# Patient Record
Sex: Male | Born: 1935 | Race: White | Hispanic: No | Marital: Married | Smoking: Never smoker
Health system: Southern US, Community
[De-identification: ages and names within clinical notes are randomized; demographics above are authoritative.]

## PROBLEM LIST (undated history)

## (undated) DIAGNOSIS — M25561 Pain in right knee: Secondary | ICD-10-CM

## (undated) DIAGNOSIS — R197 Diarrhea, unspecified: Secondary | ICD-10-CM

## (undated) DIAGNOSIS — M25562 Pain in left knee: Secondary | ICD-10-CM

## (undated) DIAGNOSIS — E119 Type 2 diabetes mellitus without complications: Secondary | ICD-10-CM

## (undated) DIAGNOSIS — M1990 Unspecified osteoarthritis, unspecified site: Secondary | ICD-10-CM

## (undated) DIAGNOSIS — I739 Peripheral vascular disease, unspecified: Secondary | ICD-10-CM

## (undated) DIAGNOSIS — I10 Essential (primary) hypertension: Secondary | ICD-10-CM

## (undated) DIAGNOSIS — C61 Malignant neoplasm of prostate: Secondary | ICD-10-CM

## (undated) DIAGNOSIS — C20 Malignant neoplasm of rectum: Secondary | ICD-10-CM

## (undated) DIAGNOSIS — E1340 Other specified diabetes mellitus with diabetic neuropathy, unspecified: Secondary | ICD-10-CM

## (undated) DIAGNOSIS — K589 Irritable bowel syndrome without diarrhea: Secondary | ICD-10-CM

## (undated) DIAGNOSIS — Z86711 Personal history of pulmonary embolism: Secondary | ICD-10-CM

## (undated) DIAGNOSIS — E785 Hyperlipidemia, unspecified: Secondary | ICD-10-CM

## (undated) DIAGNOSIS — N189 Chronic kidney disease, unspecified: Secondary | ICD-10-CM

## (undated) DIAGNOSIS — N400 Enlarged prostate without lower urinary tract symptoms: Secondary | ICD-10-CM

## (undated) HISTORY — PX: EYE SURGERY: SHX253

## (undated) HISTORY — PX: JOINT REPLACEMENT: SHX530

## (undated) HISTORY — PX: PROSTATECTOMY: SHX69

## (undated) HISTORY — PX: TONSILLECTOMY: SUR1361

## (undated) HISTORY — DX: Malignant neoplasm of rectum: C20

## (undated) SURGERY — Surgical Case
Anesthesia: *Unknown

## (undated) SURGERY — PROCTECTOMY, ABDOMINOPERINEAL
Anesthesia: General

---

## 2007-02-20 ENCOUNTER — Ambulatory Visit: Payer: Self-pay | Admitting: Internal Medicine

## 2007-09-25 ENCOUNTER — Ambulatory Visit: Payer: Self-pay | Admitting: Internal Medicine

## 2007-10-03 ENCOUNTER — Emergency Department: Payer: Self-pay | Admitting: Emergency Medicine

## 2007-10-03 ENCOUNTER — Other Ambulatory Visit: Payer: Self-pay

## 2007-10-09 ENCOUNTER — Ambulatory Visit: Payer: Self-pay | Admitting: Internal Medicine

## 2007-10-18 ENCOUNTER — Ambulatory Visit: Payer: Self-pay | Admitting: Otolaryngology

## 2008-05-08 ENCOUNTER — Ambulatory Visit: Payer: Self-pay | Admitting: Internal Medicine

## 2008-05-15 ENCOUNTER — Ambulatory Visit: Payer: Self-pay | Admitting: Internal Medicine

## 2008-09-09 ENCOUNTER — Ambulatory Visit: Payer: Self-pay | Admitting: Gastroenterology

## 2008-12-10 ENCOUNTER — Emergency Department: Payer: Self-pay | Admitting: Emergency Medicine

## 2008-12-16 ENCOUNTER — Ambulatory Visit: Payer: Self-pay | Admitting: Internal Medicine

## 2009-04-01 ENCOUNTER — Ambulatory Visit: Payer: Self-pay | Admitting: Internal Medicine

## 2009-04-29 ENCOUNTER — Ambulatory Visit: Payer: Self-pay | Admitting: Internal Medicine

## 2010-02-19 ENCOUNTER — Ambulatory Visit: Payer: Self-pay | Admitting: Internal Medicine

## 2010-02-20 ENCOUNTER — Ambulatory Visit: Payer: Self-pay | Admitting: Internal Medicine

## 2010-05-31 HISTORY — PX: KNEE ARTHROSCOPY W/ MENISCAL REPAIR: SHX1877

## 2010-07-15 ENCOUNTER — Encounter (HOSPITAL_COMMUNITY)
Admission: RE | Admit: 2010-07-15 | Discharge: 2010-07-15 | Disposition: A | Discharge status: Home / Self Care | Payer: Medicare Other | Source: Ambulatory Visit | Attending: Orthopedic Surgery | Admitting: Orthopedic Surgery

## 2010-07-15 ENCOUNTER — Other Ambulatory Visit (HOSPITAL_COMMUNITY): Payer: Self-pay | Admitting: Orthopedic Surgery

## 2010-07-15 DIAGNOSIS — T84093A Other mechanical complication of internal left knee prosthesis, initial encounter: Secondary | ICD-10-CM

## 2010-07-15 DIAGNOSIS — Z01818 Encounter for other preprocedural examination: Secondary | ICD-10-CM | POA: Insufficient documentation

## 2010-07-15 DIAGNOSIS — Z01810 Encounter for preprocedural cardiovascular examination: Secondary | ICD-10-CM | POA: Insufficient documentation

## 2010-07-15 DIAGNOSIS — Z01812 Encounter for preprocedural laboratory examination: Secondary | ICD-10-CM | POA: Insufficient documentation

## 2010-07-15 LAB — CBC
HCT: 39.2 % (ref 39.0–52.0)
MCHC: 34.2 g/dL (ref 30.0–36.0)
Platelets: 187 10*3/uL (ref 150–400)
RDW: 12.6 % (ref 11.5–15.5)
WBC: 6.1 10*3/uL (ref 4.0–10.5)

## 2010-07-15 LAB — BASIC METABOLIC PANEL
BUN: 18 mg/dL (ref 6–23)
Calcium: 9.5 mg/dL (ref 8.4–10.5)
Creatinine, Ser: 0.99 mg/dL (ref 0.4–1.5)
GFR calc Af Amer: >60 mL/min (ref >60)

## 2010-07-15 LAB — PROTIME-INR
INR: 0.93 (ref 0.00–1.49)
Prothrombin Time: 12.7 seconds (ref 11.6–15.2)

## 2010-07-15 LAB — TYPE AND SCREEN
ABO/RH(D): A POS
Antibody Screen: NEGATIVE

## 2010-07-15 LAB — SURGICAL PCR SCREEN
MRSA, PCR: NEGATIVE
Staphylococcus aureus: POSITIVE — AB

## 2010-07-15 LAB — SEDIMENTATION RATE: Sed Rate: 5 mm/hr (ref 0–16)

## 2010-07-21 ENCOUNTER — Inpatient Hospital Stay (HOSPITAL_COMMUNITY)
Admission: RE | Admit: 2010-07-21 | Discharge: 2010-07-24 | DRG: 489 | Disposition: A | Discharge status: Skilled Nursing Facility | Payer: Medicare Other | Source: Ambulatory Visit | Attending: Orthopedic Surgery | Admitting: Orthopedic Surgery

## 2010-07-21 DIAGNOSIS — Z7982 Long term (current) use of aspirin: Secondary | ICD-10-CM

## 2010-07-21 DIAGNOSIS — G609 Hereditary and idiopathic neuropathy, unspecified: Secondary | ICD-10-CM | POA: Diagnosis present

## 2010-07-21 DIAGNOSIS — I10 Essential (primary) hypertension: Secondary | ICD-10-CM | POA: Diagnosis present

## 2010-07-21 DIAGNOSIS — T8489XA Other specified complication of internal orthopedic prosthetic devices, implants and grafts, initial encounter: Principal | ICD-10-CM | POA: Diagnosis present

## 2010-07-21 DIAGNOSIS — E119 Type 2 diabetes mellitus without complications: Secondary | ICD-10-CM | POA: Diagnosis present

## 2010-07-21 DIAGNOSIS — Z23 Encounter for immunization: Secondary | ICD-10-CM

## 2010-07-21 DIAGNOSIS — Y831 Surgical operation with implant of artificial internal device as the cause of abnormal reaction of the patient, or of later complication, without mention of misadventure at the time of the procedure: Secondary | ICD-10-CM | POA: Diagnosis present

## 2010-07-21 DIAGNOSIS — Z96659 Presence of unspecified artificial knee joint: Secondary | ICD-10-CM

## 2010-07-21 DIAGNOSIS — Z8546 Personal history of malignant neoplasm of prostate: Secondary | ICD-10-CM

## 2010-07-21 LAB — GLUCOSE, CAPILLARY: Glucose-Capillary: 181 mg/dL — ABNORMAL HIGH (ref 70–99)

## 2010-07-22 LAB — BASIC METABOLIC PANEL
BUN: 13 mg/dL (ref 6–23)
Calcium: 8.6 mg/dL (ref 8.4–10.5)
GFR calc non Af Amer: >60 mL/min (ref >60)
Glucose, Bld: 218 mg/dL — ABNORMAL HIGH (ref 70–99)

## 2010-07-22 LAB — CBC
MCH: 29.5 pg (ref 26.0–34.0)
MCHC: 33.1 g/dL (ref 30.0–36.0)
Platelets: 166 10*3/uL (ref 150–400)
RDW: 12.7 % (ref 11.5–15.5)

## 2010-07-22 LAB — GLUCOSE, CAPILLARY
Glucose-Capillary: 214 mg/dL — ABNORMAL HIGH (ref 70–99)
Glucose-Capillary: 154 mg/dL — ABNORMAL HIGH (ref 70–99)

## 2010-07-22 LAB — PROTIME-INR: Prothrombin Time: 13.8 seconds (ref 11.6–15.2)

## 2010-07-23 ENCOUNTER — Encounter: Payer: Self-pay | Admitting: Internal Medicine

## 2010-07-23 LAB — WOUND CULTURE

## 2010-07-23 LAB — GLUCOSE, CAPILLARY
Glucose-Capillary: 165 mg/dL — ABNORMAL HIGH (ref 70–99)
Glucose-Capillary: 173 mg/dL — ABNORMAL HIGH (ref 70–99)

## 2010-07-23 LAB — CBC
HCT: 30.5 % — ABNORMAL LOW (ref 39.0–52.0)
MCHC: 33.4 g/dL (ref 30.0–36.0)
RDW: 12.9 % (ref 11.5–15.5)

## 2010-07-23 LAB — BASIC METABOLIC PANEL
BUN: 12 mg/dL (ref 6–23)
CO2: 24 mEq/L (ref 19–32)
Chloride: 106 mEq/L (ref 96–112)
Creatinine, Ser: 1.03 mg/dL (ref 0.4–1.5)
Glucose, Bld: 159 mg/dL — ABNORMAL HIGH (ref 70–99)

## 2010-07-23 LAB — PROTIME-INR: INR: 1.37 (ref 0.00–1.49)

## 2010-07-24 LAB — BASIC METABOLIC PANEL
CO2: 25 mEq/L (ref 19–32)
Calcium: 8.3 mg/dL — ABNORMAL LOW (ref 8.4–10.5)
Creatinine, Ser: 0.93 mg/dL (ref 0.4–1.5)
GFR calc Af Amer: >60 mL/min (ref >60)
Sodium: 138 mEq/L (ref 135–145)

## 2010-07-24 LAB — GLUCOSE, CAPILLARY
Glucose-Capillary: 166 mg/dL — ABNORMAL HIGH (ref 70–99)
Glucose-Capillary: 145 mg/dL — ABNORMAL HIGH (ref 70–99)
Glucose-Capillary: 125 mg/dL — ABNORMAL HIGH (ref 70–99)

## 2010-07-24 LAB — CBC
HCT: 28.3 % — ABNORMAL LOW (ref 39.0–52.0)
MCV: 87.1 fL (ref 78.0–100.0)
Platelets: 134 10*3/uL — ABNORMAL LOW (ref 150–400)
RBC: 3.25 MIL/uL — ABNORMAL LOW (ref 4.22–5.81)
RDW: 12.7 % (ref 11.5–15.5)
WBC: 8.3 10*3/uL (ref 4.0–10.5)

## 2010-07-24 LAB — PROTIME-INR: INR: 2.12 — ABNORMAL HIGH (ref 0.00–1.49)

## 2010-07-24 NOTE — Discharge Summary (Signed)
  NAMEEMILE, KYLLO                  ACCOUNT NO.:  [id number]  MEDICAL RECORD NO.:  [id number]           PATIENT TYPE:  I  LOCATION:  5011                         FACILITY:  [facility]  PHYSICIAN:  Eulas Post, MD    DATE OF BIRTH:  [birth year]  DATE OF ADMISSION:  07/21/2010 DATE OF DISCHARGE:  07/24/2010                        DISCHARGE SUMMARY - REFERRING   ADMISSION DIAGNOSIS:  Left knee painful arthroplasty.  DISCHARGE DIAGNOSIS:  Left knee painful arthroplasty.  PRIMARY PROCEDURES:  Left knee revision arthroplasty with polyethylene exchange.  DISCHARGE MEDICATIONS:  Lovenox bridging to Coumadin until therapeutic for a period of 1 month after surgery.  He will also be on Percocet for pain and can resume his home medications.  DISCHARGE INSTRUCTIONS:  Danon Lograsso can be weightbearing as tolerated and working on range of motion of his knee.  He is working with physical therapy and will have dressing changes every other day as needed.  HOSPITAL COURSE:  Mr. Rashid Whitenight is a 77 year old gentleman who complained of severe pain in his left knee status post total knee replacement.  He elected for revision surgery.  None of the components were found to be loose at the time of surgery, and there was fairly minimal wear on polyethylene, but there was a fairly hypertrophic cyclops type lesion surrounding the patella that was excised.  Cultures were also taken at time of surgery and sed rate, CRP, and cultures were all negative.  He tolerated the procedure well and postoperatively denied having any complications.  He was given perioperative antibiotics for antimicrobial prophylaxis.  He was given sequential compressive devices and Coumadin and Lovenox for DVT prophylaxis.  He was ambulating with physical therapy making slow and steady progress.  He is planned be discharged to skilled nursing facility with followup with me in 2 weeks. There were no complications.  He benefited maximally  from this hospital stay.     Eulas Post, MD     JPL/MEDQ  D:  07/24/2010  T:  07/24/2010  Job:  [id number]  Electronically Signed by Teryl Lucy MD on 07/24/2010 11:20:37 AM

## 2010-07-24 NOTE — Op Note (Signed)
Darryl Hooper, Darryl Hooper                  ACCOUNT NO.:  [id number]  MEDICAL RECORD NO.:  [id number]           PATIENT TYPE:  I  LOCATION:  5011                         FACILITY:  [facility]  PHYSICIAN:  Eulas Post, MD    DATE OF BIRTH:  11/25/1929  DATE OF PROCEDURE:  07/21/2010 DATE OF DISCHARGE:                              OPERATIVE REPORT   PREOPERATIVE DIAGNOSIS:  Failed left total knee arthroplasty.  POSTOPERATIVE DIAGNOSIS:  Failed left total knee arthroplasty.  OPERATIVE PROCEDURE:  Revision of left total knee arthroplasty with extensive synovectomy and polyethylene exchange.  ANESTHESIA:  General.  ESTIMATED BLOOD LOSS:  Minimal.  TOURNIQUET TIME:  Approximately 80 minutes.  ATTENDING SURGEON:  Eulas Post, MD  FIRST ASSISTANT:  Skip Mayer, PA-C  PREOPERATIVE INDICATIONS:  [name] is a 77 year old gentleman who had had left total knee arthroplasty in 2004 who complained of ongoing severe pain as well as recurrent swelling.  He had preoperative labs that were normal including sed rate and CRP, and infection workup had been negative.  He had x-rays that have a suggestion of some narrowing of the polyethylene liner, as well as questionable integrity of the tibial component with a very small cement mantle.  He failed extensive conservative measures and complained of pain for years.  He finally sought my second opinion, and we discussed the risks, benefits, alternatives to surgical versus nonsurgical intervention including but not limited to risks of infection, bleeding, nerve injury, periprosthetic fracture, blood clots, blood transfusion, incomplete relief of pain, instability of the knee, cardiopulmonary complications, among others, and he is willing to proceed.  OPERATIVE PROCEDURE:  The patient was brought to the operating room, placed in supine position.  IV antibiotics were given.  General anesthesia administered.  Left lower extremity was prepped and  draped in usual sterile fashion.  Incision was made through his previous incision. Medial parapatellar approach was performed.  The joint fluid was normal. There was fairly extensive synovial scar formation diffusely around the knee.  I excised this completely.  There was a large discoid circumferential soft tissue hypertrophy around the patella, such that the patellar button itself was not exposed at all.  I excised this meniscus completely.  I inspected the patella closely, and there were no signs of loosening.  The button was well fixed.  I then performed a slight exposure of the medial side, and debrided all of the synovium as well as the soft tissues that appeared to be potentially irritating.  There was some small areas of yellowish tan tissue around the cement interface on the medial side of the femur that was debrided.  The synovium itself appeared to be somewhat hypertrophic and irritated.  I debrided the tissue from the notch, as well as from the lateral gutter, as well as posterior to the patellar tendon.  Once all of the debridement and excision of the synovium had been completed, I then everted the patella and brought the knee into flexion and removed the tibial polyethylene insert.  This insert was in relatively good condition, although there was a small area of pinning  on the lateral condyle, as well as a little bit of wear on the polyethylene post.  The component was a size 9 x 12.  I removed the polyethylene and then inspected the trays, and they were all intact.  I was fairly aggressive at trying to destabilize the tibia as well as the femur, however, the components were very well fixed.  I did not appreciate any evidence for loosening in either component.  I inspected both medial lateral as well as at the posterior on both the femur and the tibia.  Once I had assured myself that the components were indeed solid, I checked his flexion and extension gaps, and they were  fairly symmetric.  With a 12-mm polyethylene trial, his knee reached full extension and dropped to 115 degrees of flexion and was stable in all ranges.  The patella was somewhat bound laterally, and I performed a subcutaneous release of adhesions, which allowed the patella to track centrally.  No thumb technique was needed.  I then trialed with the size 15 polyethylene liner; however, this is tight and would not reach full extension, and I felt that I was overstuffing the joint to go up to a 15.  For this reason, I selected the size 12 polyethylene liner and I inserted it and it fit in appropriately.  Prior to placing the liner, I also irrigated the knee copiously with a total of 6 liters of normal saline.  I also sent deep cultures of the knee.  There were no clinical signs of infection.  Once irrigation had been completed, I placed the real polyethylene liner and the knee again had full motion with normal stability.  I then repaired the fascia.  The parapatellar tissue with #1 Vicryl followed by 0 and 2-0 Vicryl for the subcutaneous tissue and Monocryl for the skin. Steri-Strips and sterile gauze were applied.  We then released the tourniquet, and the patient was awakened and returned to PACU in stable and satisfactory condition.  Skip Mayer was present and scrubbed throughout the case and critical for completion assisting with exposure, retraction, positioning, and closure.     Eulas Post, MD     JPL/MEDQ  D:  07/21/2010  T:  07/22/2010  Job:  [id number]  Electronically Signed by Teryl Lucy MD on 07/24/2010 11:20:35 AM

## 2010-07-27 LAB — ANAEROBIC CULTURE

## 2010-07-30 ENCOUNTER — Encounter: Payer: Self-pay | Admitting: Internal Medicine

## 2010-09-08 ENCOUNTER — Ambulatory Visit: Payer: Self-pay

## 2010-11-25 ENCOUNTER — Inpatient Hospital Stay: Payer: Self-pay | Admitting: Internal Medicine

## 2010-11-25 DIAGNOSIS — I059 Rheumatic mitral valve disease, unspecified: Secondary | ICD-10-CM

## 2012-05-06 ENCOUNTER — Ambulatory Visit: Payer: Self-pay | Admitting: Internal Medicine

## 2012-12-27 DIAGNOSIS — C61 Malignant neoplasm of prostate: Secondary | ICD-10-CM | POA: Insufficient documentation

## 2014-07-01 DIAGNOSIS — I10 Essential (primary) hypertension: Secondary | ICD-10-CM | POA: Diagnosis not present

## 2014-07-01 DIAGNOSIS — R197 Diarrhea, unspecified: Secondary | ICD-10-CM | POA: Diagnosis not present

## 2014-07-01 DIAGNOSIS — L309 Dermatitis, unspecified: Secondary | ICD-10-CM | POA: Diagnosis not present

## 2014-07-01 DIAGNOSIS — M1990 Unspecified osteoarthritis, unspecified site: Secondary | ICD-10-CM | POA: Diagnosis not present

## 2014-07-01 DIAGNOSIS — E119 Type 2 diabetes mellitus without complications: Secondary | ICD-10-CM | POA: Diagnosis not present

## 2014-07-17 DIAGNOSIS — E119 Type 2 diabetes mellitus without complications: Secondary | ICD-10-CM | POA: Diagnosis not present

## 2014-07-17 DIAGNOSIS — L309 Dermatitis, unspecified: Secondary | ICD-10-CM | POA: Diagnosis not present

## 2014-07-17 DIAGNOSIS — I10 Essential (primary) hypertension: Secondary | ICD-10-CM | POA: Diagnosis not present

## 2014-07-17 DIAGNOSIS — I739 Peripheral vascular disease, unspecified: Secondary | ICD-10-CM | POA: Diagnosis not present

## 2014-07-17 DIAGNOSIS — E1140 Type 2 diabetes mellitus with diabetic neuropathy, unspecified: Secondary | ICD-10-CM | POA: Diagnosis not present

## 2014-08-02 DIAGNOSIS — E119 Type 2 diabetes mellitus without complications: Secondary | ICD-10-CM | POA: Diagnosis not present

## 2014-08-02 DIAGNOSIS — M7989 Other specified soft tissue disorders: Secondary | ICD-10-CM | POA: Diagnosis not present

## 2014-08-02 DIAGNOSIS — M79609 Pain in unspecified limb: Secondary | ICD-10-CM | POA: Diagnosis not present

## 2014-08-02 DIAGNOSIS — I87099 Postthrombotic syndrome with other complications of unspecified lower extremity: Secondary | ICD-10-CM | POA: Diagnosis not present

## 2014-08-23 DIAGNOSIS — R197 Diarrhea, unspecified: Secondary | ICD-10-CM | POA: Diagnosis not present

## 2014-08-23 DIAGNOSIS — M1990 Unspecified osteoarthritis, unspecified site: Secondary | ICD-10-CM | POA: Diagnosis not present

## 2014-08-23 DIAGNOSIS — I10 Essential (primary) hypertension: Secondary | ICD-10-CM | POA: Diagnosis not present

## 2014-08-23 DIAGNOSIS — E1140 Type 2 diabetes mellitus with diabetic neuropathy, unspecified: Secondary | ICD-10-CM | POA: Diagnosis not present

## 2014-08-23 DIAGNOSIS — K219 Gastro-esophageal reflux disease without esophagitis: Secondary | ICD-10-CM | POA: Diagnosis not present

## 2014-09-06 DIAGNOSIS — R197 Diarrhea, unspecified: Secondary | ICD-10-CM | POA: Diagnosis not present

## 2014-09-10 DIAGNOSIS — R197 Diarrhea, unspecified: Secondary | ICD-10-CM | POA: Diagnosis not present

## 2014-09-11 DIAGNOSIS — K529 Noninfective gastroenteritis and colitis, unspecified: Secondary | ICD-10-CM | POA: Diagnosis not present

## 2014-09-12 DIAGNOSIS — R197 Diarrhea, unspecified: Secondary | ICD-10-CM | POA: Diagnosis not present

## 2014-09-16 DIAGNOSIS — I872 Venous insufficiency (chronic) (peripheral): Secondary | ICD-10-CM | POA: Diagnosis not present

## 2014-09-16 DIAGNOSIS — M7989 Other specified soft tissue disorders: Secondary | ICD-10-CM | POA: Diagnosis not present

## 2014-09-16 DIAGNOSIS — E119 Type 2 diabetes mellitus without complications: Secondary | ICD-10-CM | POA: Diagnosis not present

## 2014-09-16 DIAGNOSIS — M79609 Pain in unspecified limb: Secondary | ICD-10-CM | POA: Diagnosis not present

## 2014-09-27 DIAGNOSIS — E119 Type 2 diabetes mellitus without complications: Secondary | ICD-10-CM | POA: Diagnosis not present

## 2014-09-27 DIAGNOSIS — M25562 Pain in left knee: Secondary | ICD-10-CM | POA: Diagnosis not present

## 2014-09-27 DIAGNOSIS — G629 Polyneuropathy, unspecified: Secondary | ICD-10-CM | POA: Diagnosis not present

## 2014-09-27 DIAGNOSIS — G8929 Other chronic pain: Secondary | ICD-10-CM | POA: Diagnosis not present

## 2014-09-27 DIAGNOSIS — I10 Essential (primary) hypertension: Secondary | ICD-10-CM | POA: Diagnosis not present

## 2014-09-30 DIAGNOSIS — E11329 Type 2 diabetes mellitus with mild nonproliferative diabetic retinopathy without macular edema: Secondary | ICD-10-CM | POA: Diagnosis not present

## 2014-10-15 ENCOUNTER — Other Ambulatory Visit
Admission: RE | Admit: 2014-10-15 | Discharge: 2014-10-15 | Disposition: A | Discharge status: Home / Self Care | Payer: Medicare Other | Source: Ambulatory Visit | Attending: Urgent Care | Admitting: Urgent Care

## 2014-10-15 DIAGNOSIS — K529 Noninfective gastroenteritis and colitis, unspecified: Secondary | ICD-10-CM | POA: Insufficient documentation

## 2014-10-17 LAB — C DIFFICILE QUICK SCREEN W PCR REFLEX
C DIFFICILE (CDIFF) INTERP: NEGATIVE
C Diff antigen: NEGATIVE
C Diff toxin: NEGATIVE

## 2014-10-18 ENCOUNTER — Encounter: Payer: Self-pay | Admitting: *Deleted

## 2014-10-22 NOTE — Discharge Instructions (Signed)

## 2014-10-23 ENCOUNTER — Encounter
Admission: RE | Disposition: A | Discharge status: Home / Self Care | Payer: Self-pay | Source: Ambulatory Visit | Attending: Gastroenterology

## 2014-10-23 ENCOUNTER — Ambulatory Visit: Payer: Medicare Other | Admitting: Student in an Organized Health Care Education/Training Program

## 2014-10-23 ENCOUNTER — Encounter: Payer: Self-pay | Admitting: Anesthesiology

## 2014-10-23 ENCOUNTER — Ambulatory Visit
Admission: RE | Admit: 2014-10-23 | Discharge: 2014-10-23 | Disposition: A | Discharge status: Home / Self Care | Payer: Medicare Other | Source: Ambulatory Visit | Attending: Gastroenterology | Admitting: Gastroenterology

## 2014-10-23 DIAGNOSIS — Z820 Family history of epilepsy and other diseases of the nervous system: Secondary | ICD-10-CM | POA: Diagnosis not present

## 2014-10-23 DIAGNOSIS — I739 Peripheral vascular disease, unspecified: Secondary | ICD-10-CM | POA: Diagnosis not present

## 2014-10-23 DIAGNOSIS — K219 Gastro-esophageal reflux disease without esophagitis: Secondary | ICD-10-CM | POA: Diagnosis not present

## 2014-10-23 DIAGNOSIS — M25562 Pain in left knee: Secondary | ICD-10-CM | POA: Diagnosis not present

## 2014-10-23 DIAGNOSIS — C20 Malignant neoplasm of rectum: Secondary | ICD-10-CM | POA: Diagnosis not present

## 2014-10-23 DIAGNOSIS — R05 Cough: Secondary | ICD-10-CM | POA: Diagnosis not present

## 2014-10-23 DIAGNOSIS — Z8249 Family history of ischemic heart disease and other diseases of the circulatory system: Secondary | ICD-10-CM | POA: Insufficient documentation

## 2014-10-23 DIAGNOSIS — E785 Hyperlipidemia, unspecified: Secondary | ICD-10-CM | POA: Diagnosis not present

## 2014-10-23 DIAGNOSIS — E119 Type 2 diabetes mellitus without complications: Secondary | ICD-10-CM | POA: Insufficient documentation

## 2014-10-23 DIAGNOSIS — M25561 Pain in right knee: Secondary | ICD-10-CM | POA: Insufficient documentation

## 2014-10-23 DIAGNOSIS — Z79899 Other long term (current) drug therapy: Secondary | ICD-10-CM | POA: Diagnosis not present

## 2014-10-23 DIAGNOSIS — N400 Enlarged prostate without lower urinary tract symptoms: Secondary | ICD-10-CM | POA: Diagnosis not present

## 2014-10-23 DIAGNOSIS — Z800 Family history of malignant neoplasm of digestive organs: Secondary | ICD-10-CM | POA: Insufficient documentation

## 2014-10-23 DIAGNOSIS — I10 Essential (primary) hypertension: Secondary | ICD-10-CM | POA: Insufficient documentation

## 2014-10-23 DIAGNOSIS — R2681 Unsteadiness on feet: Secondary | ICD-10-CM | POA: Diagnosis not present

## 2014-10-23 DIAGNOSIS — L308 Other specified dermatitis: Secondary | ICD-10-CM | POA: Diagnosis not present

## 2014-10-23 DIAGNOSIS — Z96652 Presence of left artificial knee joint: Secondary | ICD-10-CM | POA: Diagnosis not present

## 2014-10-23 DIAGNOSIS — Z7982 Long term (current) use of aspirin: Secondary | ICD-10-CM | POA: Insufficient documentation

## 2014-10-23 DIAGNOSIS — K529 Noninfective gastroenteritis and colitis, unspecified: Secondary | ICD-10-CM | POA: Insufficient documentation

## 2014-10-23 DIAGNOSIS — M1990 Unspecified osteoarthritis, unspecified site: Secondary | ICD-10-CM | POA: Insufficient documentation

## 2014-10-23 DIAGNOSIS — F329 Major depressive disorder, single episode, unspecified: Secondary | ICD-10-CM | POA: Diagnosis not present

## 2014-10-23 DIAGNOSIS — K6289 Other specified diseases of anus and rectum: Secondary | ICD-10-CM | POA: Diagnosis not present

## 2014-10-23 DIAGNOSIS — R229 Localized swelling, mass and lump, unspecified: Secondary | ICD-10-CM | POA: Diagnosis not present

## 2014-10-23 DIAGNOSIS — Z8546 Personal history of malignant neoplasm of prostate: Secondary | ICD-10-CM | POA: Diagnosis not present

## 2014-10-23 DIAGNOSIS — R197 Diarrhea, unspecified: Secondary | ICD-10-CM | POA: Insufficient documentation

## 2014-10-23 HISTORY — DX: Peripheral vascular disease, unspecified: I73.9

## 2014-10-23 HISTORY — DX: Essential (primary) hypertension: I10

## 2014-10-23 HISTORY — DX: Pain in right knee: M25.561

## 2014-10-23 HISTORY — PX: COLONOSCOPY: SHX5424

## 2014-10-23 HISTORY — DX: Pain in left knee: M25.562

## 2014-10-23 HISTORY — DX: Unspecified osteoarthritis, unspecified site: M19.90

## 2014-10-23 HISTORY — DX: Diarrhea, unspecified: R19.7

## 2014-10-23 HISTORY — DX: Type 2 diabetes mellitus without complications: E11.9

## 2014-10-23 HISTORY — DX: Malignant neoplasm of prostate: C61

## 2014-10-23 HISTORY — DX: Benign prostatic hyperplasia without lower urinary tract symptoms: N40.0

## 2014-10-23 HISTORY — DX: Hyperlipidemia, unspecified: E78.5

## 2014-10-23 LAB — GLUCOSE, CAPILLARY
GLUCOSE-CAPILLARY: 156 mg/dL — AB (ref 65–99)
Glucose-Capillary: 135 mg/dL — ABNORMAL HIGH (ref 65–99)

## 2014-10-23 SURGERY — COLONOSCOPY
Anesthesia: Monitor Anesthesia Care | Wound class: Contaminated

## 2014-10-23 MED ORDER — STERILE WATER FOR IRRIGATION IR SOLN
Status: DC | PRN
  Administered 2014-10-23: 08:00:00

## 2014-10-23 MED ORDER — LACTATED RINGERS IV SOLN
INTRAVENOUS | Status: DC
  Administered 2014-10-23 (×2): via INTRAVENOUS

## 2014-10-23 MED ORDER — LIDOCAINE HCL (CARDIAC) 20 MG/ML IV SOLN
INTRAVENOUS | Status: DC | PRN
  Administered 2014-10-23: 50 mg via INTRAVENOUS

## 2014-10-23 MED ORDER — PROPOFOL 10 MG/ML IV BOLUS
INTRAVENOUS | Status: DC | PRN
  Administered 2014-10-23: 20 mg via INTRAVENOUS
  Administered 2014-10-23 (×2): 10 mg via INTRAVENOUS
  Administered 2014-10-23: 20 mg via INTRAVENOUS
  Administered 2014-10-23: 10 mg via INTRAVENOUS
  Administered 2014-10-23: 60 mg via INTRAVENOUS
  Administered 2014-10-23: 20 mg via INTRAVENOUS
  Administered 2014-10-23: 40 mg via INTRAVENOUS

## 2014-10-23 SURGICAL SUPPLY — 27 items
CANISTER SUCT 1200ML W/VALVE (MISCELLANEOUS) ×3 IMPLANT
FCP ESCP3.2XJMB 240X2.8X (MISCELLANEOUS)
FORCEPS BIOP RAD 4 LRG CAP 4 (CUTTING FORCEPS) ×3 IMPLANT
FORCEPS BIOP RJ4 240 W/NDL (MISCELLANEOUS)
FORCEPS ESCP3.2XJMB 240X2.8X (MISCELLANEOUS) IMPLANT
GOWN CVR UNV OPN BCK APRN NK (MISCELLANEOUS) ×2 IMPLANT
GOWN ISOL THUMB LOOP REG UNIV (MISCELLANEOUS) ×4
HEMOCLIP INSTINCT (CLIP) IMPLANT
INJECTOR VARIJECT VIN23 (MISCELLANEOUS) IMPLANT
KIT CO2 TUBING (TUBING) ×3 IMPLANT
KIT DEFENDO VALVE AND CONN (KITS) IMPLANT
KIT ENDO PROCEDURE OLY (KITS) ×3 IMPLANT
LIGATOR MULTIBAND 6SHOOTER MBL (MISCELLANEOUS) IMPLANT
MARKER SPOT ENDO TATTOO 5ML (MISCELLANEOUS) IMPLANT
PAD GROUND ADULT SPLIT (MISCELLANEOUS) IMPLANT
SNARE SHORT THROW 13M SML OVAL (MISCELLANEOUS) IMPLANT
SNARE SHORT THROW 30M LRG OVAL (MISCELLANEOUS) IMPLANT
SPOT EX ENDOSCOPIC TATTOO (MISCELLANEOUS)
SUCTION POLY TRAP 4CHAMBER (MISCELLANEOUS) IMPLANT
TRAP SUCTION POLY (MISCELLANEOUS) IMPLANT
TUBING CONN 6MMX3.1M (TUBING)
TUBING SUCTION CONN 0.25 STRL (TUBING) IMPLANT
UNDERPAD 30X60 958B10 (PK) (MISCELLANEOUS) IMPLANT
VALVE BIOPSY ENDO (VALVE) IMPLANT
VARIJECT INJECTOR VIN23 (MISCELLANEOUS)
WATER AUXILLARY (MISCELLANEOUS) IMPLANT
WATER STERILE IRR 500ML POUR (IV SOLUTION) ×3 IMPLANT

## 2014-10-23 NOTE — Anesthesia Preprocedure Evaluation (Addendum)
Anesthesia Evaluation  Patient identified by MRN, date of birth, ID band Patient awake    Reviewed: Allergy & Precautions, H&P , Patient's Chart, lab work & pertinent test results  History of Anesthesia Complications Negative for: history of anesthetic complications  Airway Mallampati: IV  TM Distance: >3 FB Neck ROM: full    Dental no notable dental hx.    Pulmonary neg pulmonary ROS,    Pulmonary exam normal       Cardiovascular hypertension, Normal cardiovascular exam    Neuro/Psych negative neurological ROS     GI/Hepatic negative GI ROS, Neg liver ROS,   Endo/Other  diabetes  Renal/GU negative Renal ROS     Musculoskeletal   Abdominal   Peds  Hematology negative hematology ROS (+)   Anesthesia Other Findings   Reproductive/Obstetrics                           Anesthesia Physical Anesthesia Plan  ASA: II  Anesthesia Plan: MAC   Post-op Pain Management:    Induction:   Airway Management Planned:   Additional Equipment:   Intra-op Plan:   Post-operative Plan:   Informed Consent: I have reviewed the patients History and Physical, chart, labs and discussed the procedure including the risks, benefits and alternatives for the proposed anesthesia with the patient or authorized representative who has indicated his/her understanding and acceptance.     Plan Discussed with: CRNA  Anesthesia Plan Comments:        Anesthesia Quick Evaluation

## 2014-10-23 NOTE — H&P (Signed)
  [facility]  [address], [place], [place] [address] Phone: [phone] Fax : [phone]  Primary Care Physician:  Philbert Riser,  Nikki Dom, MD Primary Gastroenterologist:  Dr. Allen Norris  Pre-Procedure History & Physical: HPI:  Darryl Hooper is a 81 y.o. male is here for an colonoscopy.   Past Medical History  Diagnosis Date  . Hypertension   . Peripheral vascular disease   . Hyperlipidemia   . Diarrhea   . Diabetes mellitus without complication   . BPH (benign prostatic hyperplasia)   . Prostate cancer   . Arthritis   . Knee pain, bilateral     Past Surgical History  Procedure Laterality Date  . Prostatectomy    . Joint replacement      left knee    Prior to Admission medications   Medication Sig Start Date End Date Taking? Authorizing Provider  AMLODIPINE BESYLATE PO Take 2.5 mg by mouth daily.   Yes Historical Provider, MD  aspirin 81 MG tablet Take 81 mg by mouth daily.   Yes Historical Provider, MD  cholecalciferol (VITAMIN D) 1000 UNITS tablet Take 1,000 Units by mouth.   Yes Historical Provider, MD  glimepiride (AMARYL) 4 MG tablet Take 4 mg by mouth daily.   Yes Historical Provider, MD  ketoconazole (NIZORAL) 2 % cream Apply 1 application topically daily as needed for irritation.   Yes Historical Provider, MD  Loperamide-Simethicone 2-125 MG TABS Take 1 tablet by mouth as needed.   Yes Historical Provider, MD  LOSARTAN POTASSIUM PO Take 100 mg by mouth daily.   Yes Historical Provider, MD  triamcinolone cream (KENALOG) 0.1 % Apply 1 application topically as needed.   Yes Historical Provider, MD    Allergies as of 10/18/2014  . (Not on File)    History reviewed. No pertinent family history.  History   Social History  . Marital Status: Married    Spouse Name: N/A  . Number of Children: N/A  . Years of Education: N/A   Occupational History  . Not on file.   Social History Main Topics  . Smoking status: Never Smoker   . Smokeless  tobacco: Not on file  . Alcohol Use: No  . Drug Use: No  . Sexual Activity: Not on file   Other Topics Concern  . Not on file   Social History Narrative    Review of Systems: See HPI, otherwise negative ROS  Physical Exam: BP 135/83 mmHg  Pulse 60  Temp(Src) 98.1 F (36.7 C) (Tympanic)  Resp 18  Ht 6\' 2"  (1.88 m)  Wt 212 lb (96.163 kg)  BMI 27.21 kg/m2  SpO2 98% General:   Alert,  pleasant and cooperative in NAD Head:  Normocephalic and atraumatic. Neck:  Supple; no masses or thyromegaly. Lungs:  Clear throughout to auscultation.    Heart:  Regular rate and rhythm. Abdomen:  Soft, nontender and nondistended. Normal bowel sounds, without guarding, and without rebound.   Neurologic:  Alert and  oriented x4;  grossly normal neurologically.  Impression/Plan: Darryl Hooper is here for an colonoscopy to be performed for diarrhea  Risks, benefits, limitations, and alternatives regarding  colonoscopy have been reviewed with the patient.  Questions have been answered.  All parties agreeable.   [facility], MD  10/23/2014, 8:12 AM

## 2014-10-23 NOTE — Op Note (Signed)
[facility] Gastroenterology Patient Name: Darryl Hooper Procedure Date: 10/23/2014 8:08 AM MRN: [id number] Account #: [id number] Date of Birth: [birth year] Admit Type: Outpatient Age: 81 Room: [facility] OR ROOM 01 Gender: Male Note Status: Finalized Procedure:         Colonoscopy Indications:       Chronic diarrhea Providers:         Lucilla Lame, MD Referring MD:      Arlis Porta, MD (Referring MD) Medicines:         Propofol per Anesthesia Complications:     No immediate complications. Procedure:         Pre-Anesthesia Assessment:                    - Prior to the procedure, a History and Physical was                     performed, and patient medications and allergies were                     reviewed. The patient's tolerance of previous anesthesia                     was also reviewed. The risks and benefits of the procedure                     and the sedation options and risks were discussed with the                     patient. All questions were answered, and informed consent                     was obtained. Prior Anticoagulants: The patient has taken                     no previous anticoagulant or antiplatelet agents. ASA                     Grade Assessment: II - A patient with mild systemic                     disease. After reviewing the risks and benefits, the                     patient was deemed in satisfactory condition to undergo                     the procedure.                    After obtaining informed consent, the colonoscope was                     passed under direct vision. Throughout the procedure, the                     patient's blood pressure, pulse, and oxygen saturations                     were monitored continuously. The Olympus CF H180AL                     colonoscope (S#: [id number]) was introduced through the anus  and advanced to the the terminal ileum. The colonoscopy                     was performed  without difficulty. The patient tolerated                     the procedure well. The quality of the bowel preparation                     was excellent. Findings:      The digital rectal exam revealed a hard rectal mass palpated 0 to 1 cm       from the anal verge. The mass was non-circumferential.      The terminal ileum appeared normal. Random biopsies were obtained in the       entire colon with cold forceps for histology.      Random biopsies were obtained with cold forceps for histology randomly       in the entire colon. Impression:        - Rectal mass 0 to 1 cm from the anal verge.                    - The examined portion of the ileum was normal.                    - Random biopsies were obtained in the entire colon.                    - Random biopsies were obtained in the entire colon. Recommendation:    - Await pathology results.                    - Refer to a Psychologist, sport and exercise. Procedure Code(s): --- Professional ---                    [phone], Colonoscopy, flexible; with biopsy, single or                     multiple Diagnosis Code(s): --- Professional ---                    K52.9, Noninfective gastroenteritis and colitis,                     unspecified                    K62.89, Other specified diseases of anus and rectum CPT copyright 2014 American Medical Association. All rights reserved. The codes documented in this report are preliminary and upon coder review may  be revised to meet current compliance requirements. Lucilla Lame, MD 10/23/2014 8:41:37 AM This report has been signed electronically. Number of Addenda: 0 Note Initiated On: 10/23/2014 8:08 AM Scope Withdrawal Time: 0 hours 8 minutes 45 seconds  Total Procedure Duration: 0 hours 14 minutes 58 seconds       [facility]

## 2014-10-23 NOTE — Anesthesia Postprocedure Evaluation (Signed)
  Anesthesia Post-op Note  Patient: Darryl Hooper  Procedure(s) Performed: Procedure(s): COLONOSCOPY (N/A)  Anesthesia type:MAC  Patient location: PACU  Post pain: Pain level controlled  Post assessment: Post-op Vital signs reviewed, Patient's Cardiovascular Status Stable, Respiratory Function Stable, Patent Airway and No signs of Nausea or vomiting  Post vital signs: Reviewed and stable  Last Vitals:  Filed Vitals:   10/23/14 0913  BP: 127/84  Pulse: 57  Temp:   Resp: 15    Level of consciousness: awake, alert  and patient cooperative  Complications: No apparent anesthesia complications

## 2014-10-23 NOTE — Anesthesia Procedure Notes (Signed)
Procedure Name: MAC Performed by: Osama Coleson Pre-anesthesia Checklist: Patient identified, Emergency Drugs available, Suction available, Patient being monitored and Timeout performed Patient Re-evaluated:Patient Re-evaluated prior to inductionOxygen Delivery Method: Nasal cannula Placement Confirmation: positive ETCO2 and breath sounds checked- equal and bilateral     

## 2014-10-23 NOTE — Transfer of Care (Signed)
Immediate Anesthesia Transfer of Care Note  Patient: Darryl Hooper  Procedure(s) Performed: Procedure(s): COLONOSCOPY (N/A)  Patient Location: PACU  Anesthesia Type: MAC  Level of Consciousness: awake, alert  and patient cooperative  Airway and Oxygen Therapy: Patient Spontanous Breathing and Patient connected to supplemental oxygen  Post-op Assessment: Post-op Vital signs reviewed, Patient's Cardiovascular Status Stable, Respiratory Function Stable, Patent Airway and No signs of Nausea or vomiting  Post-op Vital Signs: Reviewed and stable  Complications: No apparent anesthesia complications

## 2014-10-24 DIAGNOSIS — Z96652 Presence of left artificial knee joint: Secondary | ICD-10-CM | POA: Diagnosis not present

## 2014-10-24 DIAGNOSIS — M21372 Foot drop, left foot: Secondary | ICD-10-CM | POA: Diagnosis not present

## 2014-10-24 DIAGNOSIS — M25561 Pain in right knee: Secondary | ICD-10-CM | POA: Diagnosis not present

## 2014-10-24 DIAGNOSIS — M1711 Unilateral primary osteoarthritis, right knee: Secondary | ICD-10-CM | POA: Diagnosis not present

## 2014-10-24 DIAGNOSIS — G8929 Other chronic pain: Secondary | ICD-10-CM | POA: Diagnosis not present

## 2014-10-29 ENCOUNTER — Encounter: Payer: Self-pay | Admitting: Gastroenterology

## 2014-10-29 LAB — SURGICAL PATHOLOGY

## 2014-10-31 ENCOUNTER — Other Ambulatory Visit: Payer: Self-pay

## 2014-10-31 ENCOUNTER — Ambulatory Visit: Payer: Self-pay | Admitting: Surgery

## 2014-10-31 DIAGNOSIS — E785 Hyperlipidemia, unspecified: Secondary | ICD-10-CM | POA: Insufficient documentation

## 2014-10-31 DIAGNOSIS — N400 Enlarged prostate without lower urinary tract symptoms: Secondary | ICD-10-CM | POA: Insufficient documentation

## 2014-10-31 DIAGNOSIS — K529 Noninfective gastroenteritis and colitis, unspecified: Secondary | ICD-10-CM | POA: Insufficient documentation

## 2014-10-31 DIAGNOSIS — R6889 Other general symptoms and signs: Secondary | ICD-10-CM | POA: Insufficient documentation

## 2014-10-31 DIAGNOSIS — E119 Type 2 diabetes mellitus without complications: Secondary | ICD-10-CM | POA: Insufficient documentation

## 2014-10-31 DIAGNOSIS — L309 Dermatitis, unspecified: Secondary | ICD-10-CM | POA: Insufficient documentation

## 2014-10-31 DIAGNOSIS — R05 Cough: Secondary | ICD-10-CM | POA: Insufficient documentation

## 2014-10-31 DIAGNOSIS — R2681 Unsteadiness on feet: Secondary | ICD-10-CM | POA: Insufficient documentation

## 2014-10-31 DIAGNOSIS — R0689 Other abnormalities of breathing: Secondary | ICD-10-CM | POA: Insufficient documentation

## 2014-10-31 DIAGNOSIS — I999 Unspecified disorder of circulatory system: Secondary | ICD-10-CM | POA: Insufficient documentation

## 2014-10-31 DIAGNOSIS — R42 Dizziness and giddiness: Secondary | ICD-10-CM | POA: Insufficient documentation

## 2014-10-31 DIAGNOSIS — B356 Tinea cruris: Secondary | ICD-10-CM | POA: Insufficient documentation

## 2014-10-31 DIAGNOSIS — Z9189 Other specified personal risk factors, not elsewhere classified: Secondary | ICD-10-CM | POA: Insufficient documentation

## 2014-10-31 DIAGNOSIS — Z87898 Personal history of other specified conditions: Secondary | ICD-10-CM | POA: Insufficient documentation

## 2014-10-31 DIAGNOSIS — L989 Disorder of the skin and subcutaneous tissue, unspecified: Secondary | ICD-10-CM | POA: Insufficient documentation

## 2014-10-31 DIAGNOSIS — I739 Peripheral vascular disease, unspecified: Secondary | ICD-10-CM | POA: Insufficient documentation

## 2014-10-31 DIAGNOSIS — Z8546 Personal history of malignant neoplasm of prostate: Secondary | ICD-10-CM | POA: Insufficient documentation

## 2014-10-31 DIAGNOSIS — K219 Gastro-esophageal reflux disease without esophagitis: Secondary | ICD-10-CM | POA: Insufficient documentation

## 2014-10-31 DIAGNOSIS — G8929 Other chronic pain: Secondary | ICD-10-CM | POA: Insufficient documentation

## 2014-10-31 DIAGNOSIS — K1120 Sialoadenitis, unspecified: Secondary | ICD-10-CM | POA: Insufficient documentation

## 2014-10-31 DIAGNOSIS — L308 Other specified dermatitis: Secondary | ICD-10-CM | POA: Insufficient documentation

## 2014-10-31 DIAGNOSIS — I10 Essential (primary) hypertension: Secondary | ICD-10-CM | POA: Insufficient documentation

## 2014-10-31 DIAGNOSIS — R5383 Other fatigue: Secondary | ICD-10-CM | POA: Insufficient documentation

## 2014-10-31 DIAGNOSIS — R059 Cough, unspecified: Secondary | ICD-10-CM | POA: Insufficient documentation

## 2014-10-31 DIAGNOSIS — M1990 Unspecified osteoarthritis, unspecified site: Secondary | ICD-10-CM | POA: Insufficient documentation

## 2014-10-31 DIAGNOSIS — Z8679 Personal history of other diseases of the circulatory system: Secondary | ICD-10-CM | POA: Insufficient documentation

## 2014-10-31 DIAGNOSIS — R32 Unspecified urinary incontinence: Secondary | ICD-10-CM | POA: Insufficient documentation

## 2014-11-05 ENCOUNTER — Ambulatory Visit (INDEPENDENT_AMBULATORY_CARE_PROVIDER_SITE_OTHER): Payer: Medicare Other | Admitting: Surgery

## 2014-11-05 VITALS — BP 121/78 | HR 81 | Temp 97.8°F | Ht 74.0 in | Wt 224.0 lb

## 2014-11-05 DIAGNOSIS — E119 Type 2 diabetes mellitus without complications: Secondary | ICD-10-CM | POA: Diagnosis not present

## 2014-11-05 DIAGNOSIS — C20 Malignant neoplasm of rectum: Secondary | ICD-10-CM

## 2014-11-05 DIAGNOSIS — K6289 Other specified diseases of anus and rectum: Secondary | ICD-10-CM | POA: Diagnosis not present

## 2014-11-05 DIAGNOSIS — C61 Malignant neoplasm of prostate: Secondary | ICD-10-CM | POA: Diagnosis not present

## 2014-11-05 DIAGNOSIS — K529 Noninfective gastroenteritis and colitis, unspecified: Secondary | ICD-10-CM

## 2014-11-05 DIAGNOSIS — Z8546 Personal history of malignant neoplasm of prostate: Secondary | ICD-10-CM | POA: Diagnosis not present

## 2014-11-05 DIAGNOSIS — R159 Full incontinence of feces: Secondary | ICD-10-CM

## 2014-11-05 DIAGNOSIS — K629 Disease of anus and rectum, unspecified: Secondary | ICD-10-CM

## 2014-11-05 NOTE — Patient Instructions (Signed)
You will need an Oncology appointment with Dr. Ma Hillock, a PET-CT Scan, and and Endoscopic Rectal Ultrasound.  Our office will call with all appointment date and times as soon as these appointments are made.

## 2014-11-06 ENCOUNTER — Other Ambulatory Visit: Payer: Self-pay

## 2014-11-06 ENCOUNTER — Telehealth: Payer: Self-pay

## 2014-11-06 DIAGNOSIS — C20 Malignant neoplasm of rectum: Secondary | ICD-10-CM | POA: Insufficient documentation

## 2014-11-06 DIAGNOSIS — K6289 Other specified diseases of anus and rectum: Secondary | ICD-10-CM

## 2014-11-06 DIAGNOSIS — R159 Full incontinence of feces: Secondary | ICD-10-CM | POA: Insufficient documentation

## 2014-11-06 NOTE — Telephone Encounter (Signed)
Spoke with Mariea Clonts and explained that patient will need next available appt with Dr. Ma Hillock in Oncology and EUS scheduled. Explained taht we will also be scheduling a PET Scan for this patient.   She states that Dr. Ma Hillock does not have anything available for 2 weeks but we can get patient in with Dr. Maryjane Hurter on 11/08/14 at 0830. Agreed that this will be best for patient.  EUS will be scheduled for 11/14/14 per Kristi. After I make pt aware of appt, Steffanie Dunn will be in touch with patient for Navigator purposes as well as to review EUS instructions.

## 2014-11-06 NOTE — Progress Notes (Signed)
Subjective:     Patient ID: Darryl Hooper, male   DOB: 1929/06/08, 81 y.o.   MRN: [id number]  HPI 81 year old male with a long-standing history of worsening fecal and urinary incontinence referred by Dr. Allen Norris with a recently diagnosed rectal adenocarcinoma. Colonoscopy performed 2 weeks ago was performed for chronic diarrhea random biopsies which demonstrated no evidence of microscopic colitis. There was a large anteriorly based ulcerated lesion in the very distal rectum. Biopsies of which were reviewed and the straightening adenocarcinoma invasive in nature. The patient was unaware of the diagnosis. No further workup or staging has yet been performed. Of note the patient has had a previous history of prostate cancer treated with brachytherapy radiation.  Review of Systems  Constitutional: Negative for fever, diaphoresis, activity change, appetite change and unexpected weight change.  HENT: Negative.   Eyes: Negative.   Respiratory: Negative.   Cardiovascular: Negative.   Gastrointestinal: Positive for diarrhea. Negative for nausea, vomiting, abdominal pain, blood in stool, abdominal distention, anal bleeding and rectal pain.  Genitourinary: Positive for dysuria and urgency. Negative for hematuria and flank pain.  Musculoskeletal: Negative.   Skin: Negative.   Allergic/Immunologic: Negative.   Neurological: Negative.   Hematological: Negative.   Psychiatric/Behavioral: Negative.    Patient states that he has had fecal and urinary incontinence and has to wear an adult diaper 24 hours a day.     Objective:   Physical Exam  Constitutional: He is oriented to person, place, and time. He appears well-nourished. No distress.  HENT:  Head: Normocephalic and atraumatic.  Eyes: Pupils are equal, round, and reactive to light.  Neck: Normal range of motion.  Cardiovascular: Normal rate and regular rhythm.   Pulmonary/Chest: Effort normal. No respiratory distress. He has no wheezes.  Abdominal:  He exhibits no distension and no mass. There is no tenderness. There is no rebound and no guarding.  Genitourinary: Penis normal.  Digital rectal examination demonstrates diminished tone. There is a firm partially mobile anteriorly based rectal mass which takes up at least one third of the circumference of the anal rectal canal. It is exactly at the sphincters.  Musculoskeletal: He exhibits no edema or tenderness.  Lymphadenopathy:    He has no cervical adenopathy.  Neurological: He is alert and oriented to person, place, and time.  Skin: Skin is warm and dry. He is not diaphoretic.  Psychiatric: He has a normal mood and affect. His behavior is normal. Thought content normal.       Assessment:     81 year old with anterior and very distal rectal adenocarcinoma in the setting of prior radiation for prostate cancer. He has fecal incontinence. I am concerned about local invasion into the prostate. Further staging with endoscopic EUS and PET/CT scan with oncological referral is warranted. The patient clearly would need at the minimum an abdominal perineal resection and if locally involved into the prostate and bladder a pelvic exoneration.  If the decision is made for pelvic exoneration he'll need to be referred to a tertiary care [facility].  I discussed with them briefly the possibility of a permanent colostomy and/or urostomy based on the further staging.      A copy of the pathology report was provided to the patient and his wife. Plan:     PET/CT scan Endoscopic ultrasound of the rectum Oncology referral with Dr. Ma Hillock.

## 2014-11-07 ENCOUNTER — Telehealth: Payer: Self-pay

## 2014-11-07 NOTE — Telephone Encounter (Signed)
Pt has called back and was advised of all times, dates and instructions of appt's. Appt time for EUS was not available. I have advised pt that we will call him back with that time once it is available.

## 2014-11-07 NOTE — Telephone Encounter (Signed)
Received all appointment times and dates for needed testing and appts.  Dr. Maryjane Hurter will see patient at [facility] on 6/10.  PET Scan will be at hospital at 10:30am. Pt will need to check in at Registration desk in [place] at 10am, hold am dose of Glimiperide, and be NPO for 6 hours prior to appt.  Pt has also been scheduled for EUS on 6/16.  Called and spoke with patient's wife at this time to give appt times/dates. She was driving and does not have anything to write with so she will call back to be given all the information above.

## 2014-11-07 NOTE — Telephone Encounter (Signed)
Message left on home and mobile phone to verify that Mr Zeien is aware his appt is at the [facility] on 6/10.

## 2014-11-07 NOTE — Addendum Note (Signed)
Addended by: [name] on: 11/07/2014 10:14 AM   Modules accepted: Orders

## 2014-11-08 ENCOUNTER — Other Ambulatory Visit: Payer: Self-pay | Admitting: Surgery

## 2014-11-08 ENCOUNTER — Inpatient Hospital Stay: Payer: Medicare Other | Attending: Oncology | Admitting: Oncology

## 2014-11-08 ENCOUNTER — Ambulatory Visit (INDEPENDENT_AMBULATORY_CARE_PROVIDER_SITE_OTHER): Payer: Medicare Other | Admitting: Family Medicine

## 2014-11-08 ENCOUNTER — Ambulatory Visit
Admission: RE | Admit: 2014-11-08 | Discharge: 2014-11-08 | Disposition: A | Discharge status: Home / Self Care | Payer: Medicare Other | Source: Ambulatory Visit | Attending: Surgery | Admitting: Surgery

## 2014-11-08 ENCOUNTER — Encounter: Payer: Self-pay | Admitting: Family Medicine

## 2014-11-08 ENCOUNTER — Encounter: Payer: Self-pay | Admitting: Oncology

## 2014-11-08 VITALS — BP 120/60 | HR 60 | Temp 97.9°F | Wt 223.4 lb

## 2014-11-08 VITALS — BP 200/90 | HR 49 | Temp 96.2°F | Resp 20 | Ht 74.0 in | Wt 222.4 lb

## 2014-11-08 DIAGNOSIS — Z8546 Personal history of malignant neoplasm of prostate: Secondary | ICD-10-CM | POA: Diagnosis not present

## 2014-11-08 DIAGNOSIS — R221 Localized swelling, mass and lump, neck: Secondary | ICD-10-CM | POA: Diagnosis not present

## 2014-11-08 DIAGNOSIS — M1990 Unspecified osteoarthritis, unspecified site: Secondary | ICD-10-CM | POA: Diagnosis not present

## 2014-11-08 DIAGNOSIS — C20 Malignant neoplasm of rectum: Secondary | ICD-10-CM

## 2014-11-08 DIAGNOSIS — R159 Full incontinence of feces: Secondary | ICD-10-CM | POA: Insufficient documentation

## 2014-11-08 DIAGNOSIS — E1140 Type 2 diabetes mellitus with diabetic neuropathy, unspecified: Secondary | ICD-10-CM

## 2014-11-08 DIAGNOSIS — E119 Type 2 diabetes mellitus without complications: Secondary | ICD-10-CM | POA: Insufficient documentation

## 2014-11-08 DIAGNOSIS — Z9079 Acquired absence of other genital organ(s): Secondary | ICD-10-CM | POA: Diagnosis not present

## 2014-11-08 DIAGNOSIS — E785 Hyperlipidemia, unspecified: Secondary | ICD-10-CM | POA: Diagnosis not present

## 2014-11-08 DIAGNOSIS — G629 Polyneuropathy, unspecified: Secondary | ICD-10-CM | POA: Diagnosis not present

## 2014-11-08 DIAGNOSIS — R197 Diarrhea, unspecified: Secondary | ICD-10-CM | POA: Diagnosis not present

## 2014-11-08 DIAGNOSIS — Z79899 Other long term (current) drug therapy: Secondary | ICD-10-CM | POA: Diagnosis not present

## 2014-11-08 DIAGNOSIS — I739 Peripheral vascular disease, unspecified: Secondary | ICD-10-CM | POA: Diagnosis not present

## 2014-11-08 DIAGNOSIS — Z800 Family history of malignant neoplasm of digestive organs: Secondary | ICD-10-CM | POA: Diagnosis not present

## 2014-11-08 DIAGNOSIS — Z7982 Long term (current) use of aspirin: Secondary | ICD-10-CM | POA: Diagnosis not present

## 2014-11-08 DIAGNOSIS — I10 Essential (primary) hypertension: Secondary | ICD-10-CM

## 2014-11-08 DIAGNOSIS — N400 Enlarged prostate without lower urinary tract symptoms: Secondary | ICD-10-CM | POA: Insufficient documentation

## 2014-11-08 LAB — GLUCOSE, CAPILLARY: Glucose-Capillary: 131 mg/dL — ABNORMAL HIGH (ref 65–99)

## 2014-11-08 MED ORDER — GABAPENTIN 100 MG PO CAPS: 100.0000 mg | ORAL_CAPSULE | Freq: Every day | ORAL | Status: DC

## 2014-11-08 MED ORDER — FLUDEOXYGLUCOSE F - 18 (FDG) INJECTION
12.4800 | Freq: Once | INTRAVENOUS | Status: AC | PRN
  Administered 2014-11-08: 12.48 via INTRAVENOUS

## 2014-11-08 NOTE — Patient Instructions (Signed)
Keep follow up with Oncology and Surgery re: his rectal cancer.

## 2014-11-08 NOTE — Progress Notes (Signed)
Name: Darryl Hooper   MRN: [id number]    DOB: Feb 17, 1930   Date:11/08/2014       Progress Note  Subjective  Chief Complaint  Chief Complaint  Patient presents with  . Peripheral Neuropathy    Feet   . Rectal Cancer    Just recently found at colonoscopy    HPI   Here to discuss peripheral neuropathy of feet and L. Knee/leg.  He is still having stinging/burning pains on and off.  BP has been elevated recently.  BSs at home 90-130.  Recent diagnosis of rectal cancer.  He has seen G (Dr. Beverely Pace, Surgeon (Dr. Felton Clinton) and Oncology (Dr. Grayland Ormond).  Work up ongoing.  Past Medical History  Diagnosis Date  . Hypertension   . Peripheral vascular disease   . Hyperlipidemia   . Diarrhea   . Diabetes mellitus without complication   . BPH (benign prostatic hyperplasia)   . Prostate cancer   . Arthritis   . Knee pain, bilateral   . Rectal cancer     Past Surgical History  Procedure Laterality Date  . Prostatectomy    . Joint replacement      left knee  . Colonoscopy N/A 10/23/2014    Procedure: COLONOSCOPY;  Surgeon: Lucilla Lame, MD;  Location: [facility];  Service: Gastroenterology;  Laterality: N/A;  . Knee arthroscopy w/ meniscal repair Left 2012    Family History  Problem Relation Age of Onset  . Heart disease Mother   . Heart disease Father   . Cancer Father 57    Colon  . Multiple sclerosis Son     History   Social History  . Marital Status: Married    Spouse Name: N/A  . Number of Children: N/A  . Years of Education: N/A   Occupational History  . Not on file.   Social History Main Topics  . Smoking status: Never Smoker   . Smokeless tobacco: Not on file  . Alcohol Use: No  . Drug Use: No  . Sexual Activity: Not on file   Other Topics Concern  . Not on file   Social History Narrative     Current outpatient prescriptions:  .  AMLODIPINE BESYLATE PO, Take 2.5 mg by mouth daily., Disp: , Rfl:  .  aspirin 81 MG tablet, Take 81 mg by mouth  daily., Disp: , Rfl:  .  cholecalciferol (VITAMIN D) 1000 UNITS tablet, Take 1,000 Units by mouth., Disp: , Rfl:  .  glimepiride (AMARYL) 4 MG tablet, Take 4 mg by mouth daily., Disp: , Rfl:  .  LOSARTAN POTASSIUM PO, Take 100 mg by mouth daily., Disp: , Rfl:  .  gabapentin (NEURONTIN) 100 MG capsule, Take 1 capsule (100 mg total) by mouth at bedtime., Disp: 30 capsule, Rfl: 6  No Known Allergies   Review of Systems  Constitutional: Positive for malaise/fatigue. Negative for fever, chills and weight loss.  HENT: Negative.   Respiratory: Positive for shortness of breath (with activity). Negative for cough and wheezing.   Cardiovascular: Negative.  Negative for chest pain, palpitations, orthopnea and leg swelling.  Gastrointestinal: Positive for diarrhea. Negative for nausea, vomiting, abdominal pain and blood in stool.  Skin: Negative.  Negative for rash.  Neurological: Positive for tingling (in feet and L. leg/knee) and weakness. Negative for headaches.     Objective  Filed Vitals:   11/08/14 1345 11/08/14 1423  BP: 151/72 120/60  Pulse: 60   Temp: 97.9 F (36.6 C)  TempSrc: Oral   Weight: 223 lb 6.4 oz (101.334 kg)     Physical Exam  Constitutional: He is well-developed, well-nourished, and in no distress. No distress.  HENT:  Head: Normocephalic and atraumatic.  Eyes: EOM are normal. Pupils are equal, round, and reactive to light.  Neck: Normal range of motion. Neck supple. No thyromegaly present.  Cardiovascular: Normal rate, regular rhythm, normal heart sounds and intact distal pulses.  Exam reveals no gallop and no friction rub.   No murmur heard. Pulmonary/Chest: Effort normal and breath sounds normal. No respiratory distress. He has no wheezes. He has no rales.  Abdominal: Soft. Bowel sounds are normal. He exhibits no mass. There is no tenderness.  Musculoskeletal: He exhibits no edema.  Lymphadenopathy:    He has no cervical adenopathy.  Neurological:  Pt.  reports tingling pain in bottom of feet and around L. Knee.  No received unilateral weakness.  Skin: Skin is warm and dry.  Vitals reviewed.     Recent Results (from the past 2160 hour(s))  C difficile quick scan w PCR reflex [facility])     Status: None   Collection Time: 10/17/14  8:20 AM  Result Value Ref Range   C Diff antigen NEGATIVE    C Diff toxin NEGATIVE    C Diff interpretation Negative for C. difficile   Glucose, capillary     Status: Abnormal   Collection Time: 10/23/14  8:00 AM  Result Value Ref Range   Glucose-Capillary 156 (H) 65 - 99 mg/dL  Surgical pathology     Status: None   Collection Time: 10/23/14  8:35 AM  Result Value Ref Range   SURGICAL PATHOLOGY      Surgical Pathology CASE: [id number] PATIENT: Darryl Hooper Surgical Pathology Report     SPECIMEN SUBMITTED: A. Rectal Mass, biopsy B. Terminal ileum, biopsy C. Random colon, biopsies  CLINICAL HISTORY: None provided  PRE-OPERATIVE DIAGNOSIS: Chronic Diarrhea  POST-OPERATIVE DIAGNOSIS: Rectal Mass     DIAGNOSIS: A. RECTAL MASS; BIOPSY: - SUPERFICIAL FRAGMENTS OF ADENOCARCINOMA WITH EXTENSIVE NECROSIS.  B. TERMINAL ILEUM; BIOPSY: - SMALL BOWEL MUCOSA WITHIN NORMAL LIMITS.  C. RANDOM COLON; BIOPSY: - COLONIC MUCOSA NEGATIVE FOR MICROSCOPIC COLITIS, DYSPLASIA AND MALIGNANCY.  Note Multiple additional deeper HE stained sections (part A) were examined.  GROSS DESCRIPTION: A. Labeled: Rectal mass biopsy Tissue Fragment(s): 2 Measurement: 0.2-0.5 cm Comment: Tan tissue fragments  Entirely submitted in cassette(s): 1  B. Labeled: Terminal ileum biopsy Tissue Fragment(s): 1 Measurement: 0.5 cm Comment: Tan tissue fragment  Entirely submitte d in cassette(s): 1  C. Labeled: Random colon biopsies Tissue Fragment(s): Multiple Measurement: Aggregate 0.8 x 0.5 x 0.2 cm Comment: Tan tissue fragments  Entirely submitted in cassette(s): 1        Final Diagnosis performed by  Delorse Lek, MD.  Electronically signed 10/29/2014 1:20:31PM    The electronic signature indicates that the named Attending Pathologist has evaluated the specimen  Technical component performed at [facility], [address], [place], [place] [address] Lab: [phone] Dir: Darrick Penna. Evette Doffing, MD  Professional component performed at [facility], [facility], [place], [place], [place] [address] Lab: [phone] Dir: Dellia Nims. Rubinas, MD    Glucose, capillary     Status: Abnormal   Collection Time: 10/23/14  9:12 AM  Result Value Ref Range   Glucose-Capillary 135 (H) 65 - 99 mg/dL  Glucose, capillary     Status: Abnormal   Collection Time: 11/08/14 10:18 AM  Result Value Ref Range  Glucose-Capillary 131 (H) 65 - 99 mg/dL     Assessment & Plan  Problem List Items Addressed This Visit      Cardiovascular and Mediastinum   BP (high blood pressure) - Primary     Digestive   Rectal cancer   Relevant Medications   gabapentin (NEURONTIN) 100 MG capsule     Endocrine   Diabetes mellitus, type 2     Nervous and Auditory   Peripheral neuropathy   Relevant Medications   gabapentin (NEURONTIN) 100 MG capsule      Meds ordered this encounter  Medications  . gabapentin (NEURONTIN) 100 MG capsule    Sig: Take 1 capsule (100 mg total) by mouth at bedtime.    Dispense:  30 capsule    Refill:  6

## 2014-11-11 ENCOUNTER — Other Ambulatory Visit: Payer: Self-pay

## 2014-11-11 ENCOUNTER — Telehealth: Payer: Self-pay

## 2014-11-11 NOTE — Telephone Encounter (Signed)
Oncology Nurse Navigator Documentation  Oncology Nurse Navigator Flowsheets 11/11/2014  Navigator Encounter Type Telephone  Coordination of Care EUS  Time Spent with Patient 30  Miralax prep provided

## 2014-11-12 NOTE — Addendum Note (Signed)
Addended by: Phillips Odor on: 11/12/2014 03:28 PM   Modules accepted: Orders

## 2014-11-14 ENCOUNTER — Telehealth: Payer: Self-pay

## 2014-11-14 ENCOUNTER — Ambulatory Visit: Payer: Medicare Other | Admitting: Anesthesiology

## 2014-11-14 ENCOUNTER — Encounter: Payer: Self-pay | Admitting: *Deleted

## 2014-11-14 ENCOUNTER — Ambulatory Visit
Admission: RE | Admit: 2014-11-14 | Discharge: 2014-11-14 | Disposition: A | Discharge status: Home / Self Care | Payer: Medicare Other | Source: Ambulatory Visit | Attending: Internal Medicine | Admitting: Internal Medicine

## 2014-11-14 ENCOUNTER — Encounter
Admission: RE | Disposition: A | Discharge status: Home / Self Care | Payer: Self-pay | Source: Ambulatory Visit | Attending: Internal Medicine

## 2014-11-14 DIAGNOSIS — M25561 Pain in right knee: Secondary | ICD-10-CM | POA: Diagnosis not present

## 2014-11-14 DIAGNOSIS — C218 Malignant neoplasm of overlapping sites of rectum, anus and anal canal: Secondary | ICD-10-CM | POA: Insufficient documentation

## 2014-11-14 DIAGNOSIS — R197 Diarrhea, unspecified: Secondary | ICD-10-CM | POA: Diagnosis not present

## 2014-11-14 DIAGNOSIS — M1990 Unspecified osteoarthritis, unspecified site: Secondary | ICD-10-CM | POA: Diagnosis not present

## 2014-11-14 DIAGNOSIS — M25562 Pain in left knee: Secondary | ICD-10-CM | POA: Insufficient documentation

## 2014-11-14 DIAGNOSIS — C20 Malignant neoplasm of rectum: Secondary | ICD-10-CM | POA: Diagnosis not present

## 2014-11-14 DIAGNOSIS — N400 Enlarged prostate without lower urinary tract symptoms: Secondary | ICD-10-CM | POA: Insufficient documentation

## 2014-11-14 DIAGNOSIS — Z7982 Long term (current) use of aspirin: Secondary | ICD-10-CM | POA: Insufficient documentation

## 2014-11-14 DIAGNOSIS — E785 Hyperlipidemia, unspecified: Secondary | ICD-10-CM | POA: Diagnosis not present

## 2014-11-14 DIAGNOSIS — K219 Gastro-esophageal reflux disease without esophagitis: Secondary | ICD-10-CM | POA: Diagnosis not present

## 2014-11-14 DIAGNOSIS — Z8546 Personal history of malignant neoplasm of prostate: Secondary | ICD-10-CM | POA: Diagnosis not present

## 2014-11-14 DIAGNOSIS — E119 Type 2 diabetes mellitus without complications: Secondary | ICD-10-CM | POA: Insufficient documentation

## 2014-11-14 DIAGNOSIS — I10 Essential (primary) hypertension: Secondary | ICD-10-CM | POA: Diagnosis not present

## 2014-11-14 DIAGNOSIS — Z79899 Other long term (current) drug therapy: Secondary | ICD-10-CM | POA: Insufficient documentation

## 2014-11-14 DIAGNOSIS — I739 Peripheral vascular disease, unspecified: Secondary | ICD-10-CM | POA: Insufficient documentation

## 2014-11-14 HISTORY — PX: EUS: SHX5427

## 2014-11-14 LAB — GLUCOSE, CAPILLARY: Glucose-Capillary: 146 mg/dL — ABNORMAL HIGH (ref 65–99)

## 2014-11-14 SURGERY — ESOPHAGEAL ENDOSCOPIC ULTRASOUND (EUS) RADIAL
Anesthesia: General

## 2014-11-14 SURGERY — ULTRASOUND, LOWER GI TRACT, ENDOSCOPIC
Anesthesia: General

## 2014-11-14 MED ORDER — GLYCOPYRROLATE 0.2 MG/ML IJ SOLN
INTRAMUSCULAR | Status: DC | PRN
  Administered 2014-11-14: 0.2 mg via INTRAVENOUS

## 2014-11-14 MED ORDER — PROPOFOL 10 MG/ML IV BOLUS
INTRAVENOUS | Status: DC | PRN
  Administered 2014-11-14: 50 mg via INTRAVENOUS

## 2014-11-14 MED ORDER — LIDOCAINE HCL (CARDIAC) 20 MG/ML IV SOLN
INTRAVENOUS | Status: DC | PRN
  Administered 2014-11-14: 60 mg via INTRAVENOUS

## 2014-11-14 MED ORDER — SODIUM CHLORIDE 0.9 % IV SOLN
INTRAVENOUS | Status: DC
  Administered 2014-11-14: 10:00:00 via INTRAVENOUS

## 2014-11-14 MED ORDER — PROPOFOL INFUSION 10 MG/ML OPTIME
INTRAVENOUS | Status: DC | PRN
  Administered 2014-11-14: 50 ug/kg/min via INTRAVENOUS

## 2014-11-14 MED ORDER — EPHEDRINE SULFATE 50 MG/ML IJ SOLN
INTRAMUSCULAR | Status: DC | PRN
  Administered 2014-11-14: 10 mg via INTRAVENOUS

## 2014-11-14 NOTE — Interval H&P Note (Signed)
History and Physical Interval Note:  11/14/2014 10:22 AM  Darryl Hooper  has presented today for surgery, with the diagnosis of Rectal Cancer  The various methods of treatment have been discussed with the patient and family. After consideration of risks, benefits and other options for treatment, the patient has consented to  Procedure(s): LOWER ENDOSCOPIC ULTRASOUND (EUS) (N/A) as a surgical intervention .  The patient's history has been reviewed, patient examined, no change in status, stable for surgery.  I have reviewed the patient's chart and labs.  Questions were answered to the patient's satisfaction.     Tillie Rung

## 2014-11-14 NOTE — Op Note (Signed)
[facility] Patient Name: Darryl Hooper Procedure Date: 11/14/2014 10:27 AM MRN: [id number] Account #: [id number] Date of Birth: [birth year] Admit Type: Outpatient Age: 81 Room: [facility] ENDO ROOM 3 Gender: Male Note Status: Finalized Procedure:         Lower EUS Indications:       Pre-treatment staging for anorectal carcinoma Patient Profile:   Refer to note in patient chart for documentation of                     history and physical. Providers:         Murray Hodgkins. Shivank Pinedo Referring MD:      Arlis Porta, MD (Referring MD), Lucilla Lame, MD                     (Referring MD), Rhett Bannister. Ma Hillock, MD (Referring MD), Hortencia Conradi, MD (Referring MD) Medicines:         Propofol per Anesthesia Complications:     No immediate complications. Procedure:         Pre-Anesthesia Assessment:                    Prior to the procedure, a History and Physical was                     performed, and patient medications and allergies were                     reviewed. The patient is competent. The risks and benefits                     of the procedure and the sedation options and risks were                     discussed with the patient. All questions were answered                     and informed consent was obtained. Patient identification                     and proposed procedure were verified by the physician, the                     nurse and the anesthetist in the procedure room. Mental                     Status Examination: alert and oriented. Airway                     Examination: normal oropharyngeal airway and neck                     mobility. Respiratory Examination: clear to auscultation.                     CV Examination: normal. Prophylactic Antibiotics: The                     patient does not require prophylactic antibiotics. Prior                     Anticoagulants: The patient has taken no previous   anticoagulant  or antiplatelet agents. ASA Grade                     Assessment: III - A patient with severe systemic disease.                     After reviewing the risks and benefits, the patient was                     deemed in satisfactory condition to undergo the procedure.                     The anesthesia plan was to use monitored anesthesia care                     (MAC). Immediately prior to administration of medications,                     the patient was re-assessed for adequacy to receive                     sedatives. The heart rate, respiratory rate, oxygen                     saturations, blood pressure, adequacy of pulmonary                     ventilation, and response to care were monitored                     throughout the procedure. The physical status of the                     patient was re-assessed after the procedure.                    After obtaining informed consent, the endoscope was passed                     under direct vision. Throughout the procedure, the                     patient's blood pressure, pulse, and oxygen saturations                     were monitored continuously. The EUS GI Radial Array                     [id number] was introduced through the anus and advanced to                     the the sigmoid colon for ultrasound. The Colonoscope was                     introduced through the anus and advanced to the the                     sigmoid colon. The lower EUS was accomplished without                     difficulty. The patient tolerated the procedure well. The                     quality of the bowel preparation was good. Findings:      The digital rectal exam revealed a  firm rectal mass. The mass was       non-circumferential and located predominantly at the anterior bowel wall.      Endoscopic Finding :      An infiltrative non-obstructing large mass was found in the distal       rectum. The mass originated at the dentate line and  extending an       additional 2-3 cm proximally. The mass was partially circumferential       (involving one-third of the lumen circumference).      The proximal rectum, mid rectum, recto-sigmoid colon and sigmoid colon       appeared normal.      Endosonographic Finding :      A hypoechoic mass was found in the distal rectum. The mass was       encountered at the dentate line. The mass was non-circumferential and       located predominantly at the anterior rectal wall. The endosonographic       borders were irregular. There was sonographic evidence suggesting       invasion into the perirectal fat (Layer 5) as evident by an irregular       outer surface and internal anal sphincter muscle (manifested by       interface loss).      The perirectal space was normal.      No lymph nodes were seen in the perirectal region and in the left iliac       region. Impression:        Flexible Sigmoidoscopy Impressions:                    - Mass in the distal rectum involving the dentate line.                     Consistent with known adenocarcinoma.                    - The proximal rectum, mid rectum, recto-sigmoid colon and                     sigmoid colon are normal.                    Rectal EUS Impressions:                    - Rectal mass was visualized endosonographically. A tissue                     diagnosis was obtained prior to this exam. This is                     consistent with adenocarcinoma. This was staged uT3 uN0.                     The mass is involving the internal anal sphincter, but                     appears to spare the external anal sphincter.                    - Endosonographic images of the perirectal space were                     unremarkable.                    -  No lymph nodes were seen during endosonographic                     examination.                    - No specimens collected. Recommendation:    - Discharge patient to home (ambulatory).                     - The findings and recommendations were discussed with the                     patient and his family.                    - Return to referring physician as previously scheduled. Procedure Code(s): --- Professional ---                    [phone], Sigmoidoscopy, flexible; with endoscopic ultrasound                     examination Diagnosis Code(s): --- Professional ---                    C21.8, Malignant neoplasm of overlapping sites of rectum,                     anus and anal canal                    K62.89, Other specified diseases of anus and rectum CPT copyright 2014 American Medical Association. All rights reserved. The codes documented in this report are preliminary and upon coder review may  be revised to meet current compliance requirements. Attending Participation:      I personally performed the entire procedure without the assistance of a       fellow, resident or surgical assistant. [place],  11/14/2014 11:13:57 AM This report has been signed electronically. Number of Addenda: 0 Note Initiated On: 11/14/2014 10:27 AM      [facility]

## 2014-11-14 NOTE — Anesthesia Postprocedure Evaluation (Signed)
  Anesthesia Post-op Note  Patient: Darryl Hooper  Procedure(s) Performed: Procedure(s): LOWER ENDOSCOPIC ULTRASOUND (EUS) (N/A)  Anesthesia type:General  Patient location: PACU  Post pain: Pain level controlled  Post assessment: Post-op Vital signs reviewed, Patient's Cardiovascular Status Stable, Respiratory Function Stable, Patent Airway and No signs of Nausea or vomiting  Post vital signs: Reviewed and stable  Last Vitals:  Filed Vitals:   11/14/14 1140  BP: 151/74  Pulse: 52  Temp:   Resp: 15    Level of consciousness: awake, alert  and patient cooperative  Complications: No apparent anesthesia complications

## 2014-11-14 NOTE — Transfer of Care (Signed)
Immediate Anesthesia Transfer of Care Note  Patient: Darryl Hooper  Procedure(s) Performed: Procedure(s): LOWER ENDOSCOPIC ULTRASOUND (EUS) (N/A)  Patient Location: PACU  Anesthesia Type:General  Level of Consciousness: sedated  Airway & Oxygen Therapy: Patient Spontanous Breathing and Patient connected to nasal cannula oxygen  Post-op Assessment: Report given to RN  Post vital signs: Reviewed and stable  Last Vitals:  Filed Vitals:   11/14/14 1105  BP: 105/67  Pulse: 63  Temp: 96.6  Resp: 11    Complications: No apparent anesthesia complications

## 2014-11-14 NOTE — Telephone Encounter (Signed)
Kristi called from [facility] and had discussed plan of care with Dr. Maryjane Hurter who would like to have Dr. Marina Gravel re-consult regarding surgery because patient has prostate seeds and pt cannot have radiation.  Kristi spoke with Dr Marina Gravel and he would like to see pt next week when he is in clinic.  Appt has been made for patient for 11/19/14 at 1:15.  Called patient at mobile number. No answer. VM left for return phone call.

## 2014-11-14 NOTE — H&P (View-Only) (Signed)
  [facility]  [address] [place], [place] [address] Phone: [phone] Fax : [phone]  Primary Care Physician:  Philbert Riser,  Nikki Dom, MD Primary Gastroenterologist:  Dr. Allen Norris  Pre-Procedure History & Physical: HPI:  Darryl Hooper is a 81 y.o. male is here for an colonoscopy.   Past Medical History  Diagnosis Date  . Hypertension   . Peripheral vascular disease   . Hyperlipidemia   . Diarrhea   . Diabetes mellitus without complication   . BPH (benign prostatic hyperplasia)   . Prostate cancer   . Arthritis   . Knee pain, bilateral     Past Surgical History  Procedure Laterality Date  . Prostatectomy    . Joint replacement      left knee    Prior to Admission medications   Medication Sig Start Date End Date Taking? Authorizing Provider  AMLODIPINE BESYLATE PO Take 2.5 mg by mouth daily.   Yes Historical Provider, MD  aspirin 81 MG tablet Take 81 mg by mouth daily.   Yes Historical Provider, MD  cholecalciferol (VITAMIN D) 1000 UNITS tablet Take 1,000 Units by mouth.   Yes Historical Provider, MD  glimepiride (AMARYL) 4 MG tablet Take 4 mg by mouth daily.   Yes Historical Provider, MD  ketoconazole (NIZORAL) 2 % cream Apply 1 application topically daily as needed for irritation.   Yes Historical Provider, MD  Loperamide-Simethicone 2-125 MG TABS Take 1 tablet by mouth as needed.   Yes Historical Provider, MD  LOSARTAN POTASSIUM PO Take 100 mg by mouth daily.   Yes Historical Provider, MD  triamcinolone cream (KENALOG) 0.1 % Apply 1 application topically as needed.   Yes Historical Provider, MD    Allergies as of 10/18/2014  . (Not on File)    History reviewed. No pertinent family history.  History   Social History  . Marital Status: Married    Spouse Name: N/A  . Number of Children: N/A  . Years of Education: N/A   Occupational History  . Not on file.   Social History Main Topics  . Smoking status: Never Smoker   . Smokeless  tobacco: Not on file  . Alcohol Use: No  . Drug Use: No  . Sexual Activity: Not on file   Other Topics Concern  . Not on file   Social History Narrative    Review of Systems: See HPI, otherwise negative ROS  Physical Exam: BP 135/83 mmHg  Pulse 60  Temp(Src) 98.1 F (36.7 C) (Tympanic)  Resp 18  Ht 6\' 2"  (1.88 m)  Wt 212 lb (96.163 kg)  BMI 27.21 kg/m2  SpO2 98% General:   Alert,  pleasant and cooperative in NAD Head:  Normocephalic and atraumatic. Neck:  Supple; no masses or thyromegaly. Lungs:  Clear throughout to auscultation.    Heart:  Regular rate and rhythm. Abdomen:  Soft, nontender and nondistended. Normal bowel sounds, without guarding, and without rebound.   Neurologic:  Alert and  oriented x4;  grossly normal neurologically.  Impression/Plan: Darryl Hooper is here for an colonoscopy to be performed for diarrhea  Risks, benefits, limitations, and alternatives regarding  colonoscopy have been reviewed with the patient.  Questions have been answered.  All parties agreeable.   [facility], MD  10/23/2014, 8:12 AM

## 2014-11-14 NOTE — Telephone Encounter (Signed)
Spoke with patient at this time and he was given appt information. Verbalizes understanding and will call back if he has any other questions.

## 2014-11-14 NOTE — Anesthesia Preprocedure Evaluation (Addendum)
Anesthesia Evaluation  Patient identified by MRN, date of birth, ID band Patient awake    Reviewed: Allergy & Precautions, NPO status , Patient's Chart, lab work & pertinent test results, reviewed documented beta blocker date and time   Airway Mallampati: II  TM Distance: >3 FB     Dental   Pulmonary          Cardiovascular hypertension, Pt. on medications + Peripheral Vascular Disease     Neuro/Psych  Neuromuscular disease    GI/Hepatic GERD-  Controlled,  Endo/Other  diabetes, Type 2  Renal/GU      Musculoskeletal  (+) Arthritis -,   Abdominal   Peds  Hematology   Anesthesia Other Findings   Reproductive/Obstetrics                            Anesthesia Physical Anesthesia Plan  ASA: III  Anesthesia Plan: General   Post-op Pain Management:    Induction: Intravenous  Airway Management Planned: Nasal Cannula  Additional Equipment:   Intra-op Plan:   Post-operative Plan:   Informed Consent: I have reviewed the patients History and Physical, chart, labs and discussed the procedure including the risks, benefits and alternatives for the proposed anesthesia with the patient or authorized representative who has indicated his/her understanding and acceptance.     Plan Discussed with: CRNA  Anesthesia Plan Comments:         Anesthesia Quick Evaluation

## 2014-11-15 ENCOUNTER — Inpatient Hospital Stay: Payer: Medicare Other

## 2014-11-15 ENCOUNTER — Inpatient Hospital Stay (HOSPITAL_BASED_OUTPATIENT_CLINIC_OR_DEPARTMENT_OTHER): Payer: Medicare Other | Admitting: Oncology

## 2014-11-15 ENCOUNTER — Other Ambulatory Visit: Payer: Self-pay | Admitting: Oncology

## 2014-11-15 VITALS — BP 153/79 | HR 52 | Temp 96.7°F | Resp 18 | Wt 221.0 lb

## 2014-11-15 DIAGNOSIS — Z79899 Other long term (current) drug therapy: Secondary | ICD-10-CM

## 2014-11-15 DIAGNOSIS — C20 Malignant neoplasm of rectum: Secondary | ICD-10-CM

## 2014-11-15 DIAGNOSIS — R197 Diarrhea, unspecified: Secondary | ICD-10-CM

## 2014-11-15 DIAGNOSIS — E119 Type 2 diabetes mellitus without complications: Secondary | ICD-10-CM | POA: Diagnosis not present

## 2014-11-15 DIAGNOSIS — E785 Hyperlipidemia, unspecified: Secondary | ICD-10-CM | POA: Diagnosis not present

## 2014-11-15 DIAGNOSIS — I10 Essential (primary) hypertension: Secondary | ICD-10-CM | POA: Diagnosis not present

## 2014-11-15 DIAGNOSIS — Z7982 Long term (current) use of aspirin: Secondary | ICD-10-CM | POA: Diagnosis not present

## 2014-11-15 DIAGNOSIS — Z800 Family history of malignant neoplasm of digestive organs: Secondary | ICD-10-CM | POA: Diagnosis not present

## 2014-11-15 DIAGNOSIS — Z9079 Acquired absence of other genital organ(s): Secondary | ICD-10-CM | POA: Diagnosis not present

## 2014-11-15 DIAGNOSIS — R221 Localized swelling, mass and lump, neck: Secondary | ICD-10-CM | POA: Diagnosis not present

## 2014-11-15 DIAGNOSIS — M1990 Unspecified osteoarthritis, unspecified site: Secondary | ICD-10-CM | POA: Diagnosis not present

## 2014-11-15 DIAGNOSIS — J392 Other diseases of pharynx: Secondary | ICD-10-CM

## 2014-11-15 DIAGNOSIS — Z8546 Personal history of malignant neoplasm of prostate: Secondary | ICD-10-CM | POA: Diagnosis not present

## 2014-11-15 DIAGNOSIS — N400 Enlarged prostate without lower urinary tract symptoms: Secondary | ICD-10-CM

## 2014-11-15 DIAGNOSIS — I739 Peripheral vascular disease, unspecified: Secondary | ICD-10-CM | POA: Diagnosis not present

## 2014-11-15 DIAGNOSIS — R159 Full incontinence of feces: Secondary | ICD-10-CM | POA: Diagnosis not present

## 2014-11-15 LAB — CBC WITH DIFFERENTIAL/PLATELET
BASOS ABS: 0 10*3/uL (ref 0–0.1)
Basophils Relative: 1 %
Eosinophils Absolute: 0.2 10*3/uL (ref 0–0.7)
Eosinophils Relative: 2 %
HEMATOCRIT: 37.4 % — AB (ref 40.0–52.0)
Hemoglobin: 12.8 g/dL — ABNORMAL LOW (ref 13.0–18.0)
LYMPHS ABS: 1.2 10*3/uL (ref 1.0–3.6)
LYMPHS PCT: 16 %
MCH: 30.3 pg (ref 26.0–34.0)
MCHC: 34.2 g/dL (ref 32.0–36.0)
MCV: 88.7 fL (ref 80.0–100.0)
MONO ABS: 0.7 10*3/uL (ref 0.2–1.0)
Monocytes Relative: 9 %
Neutro Abs: 5.4 10*3/uL (ref 1.4–6.5)
Neutrophils Relative %: 72 %
Platelets: 177 10*3/uL (ref 150–440)
RBC: 4.22 MIL/uL — ABNORMAL LOW (ref 4.40–5.90)
RDW: 14.5 % (ref 11.5–14.5)
WBC: 7.5 10*3/uL (ref 3.8–10.6)

## 2014-11-15 NOTE — Progress Notes (Signed)
Patient has had stomach upset from contrast dye. No other complaints today.

## 2014-11-15 NOTE — Progress Notes (Signed)
[name]  Telephone:(336) [phone] Fax:(336) [phone]  ID: Darryl Hooper OB: [birth year]  MR#: [id number]  YOK#:[id number]  Patient Care Team: Arlis Porta., MD as PCP - General (Family Medicine) Clent Jacks, RN as Registered Nurse  CHIEF COMPLAINT:  Chief Complaint  Patient presents with  . Follow-up    INTERVAL HISTORY: Patient returns to clinic today for discussion of his imaging results and further diagnostic and treatment planning. He currently feels well. He denies any pain. He has no weight loss. He denies any recent fevers. He denies any dysphasia. He denies any chest pain or shortness of breath. Patient offers no further specific complaints.  REVIEW OF SYSTEMS:   Review of Systems  Constitutional: Negative for fever, weight loss and malaise/fatigue.  HENT: Negative for sore throat.   Respiratory: Negative.   Cardiovascular: Negative for chest pain.  Gastrointestinal: Positive for diarrhea. Negative for nausea, abdominal pain, blood in stool and melena.  Genitourinary: Negative.   Neurological: Negative for weakness.    As per HPI. Otherwise, a complete review of systems is negatve.  PAST MEDICAL HISTORY: Past Medical History  Diagnosis Date  . Hypertension   . Peripheral vascular disease   . Hyperlipidemia   . Diarrhea   . Diabetes mellitus without complication   . BPH (benign prostatic hyperplasia)   . Prostate cancer   . Arthritis   . Knee pain, bilateral   . Rectal cancer     PAST SURGICAL HISTORY: Past Surgical History  Procedure Laterality Date  . Prostatectomy    . Joint replacement      left knee  . Colonoscopy N/A 10/23/2014    Procedure: COLONOSCOPY;  Surgeon: Lucilla Lame, MD;  Location: [facility];  Service: Gastroenterology;  Laterality: N/A;  . Knee arthroscopy w/ meniscal repair Left 2012    FAMILY HISTORY Family History  Problem Relation Age of Onset  . Heart disease Mother   . Heart disease  Father   . Cancer Father 62    Colon  . Multiple sclerosis Son        ADVANCED DIRECTIVES:    HEALTH MAINTENANCE: History  Substance Use Topics  . Smoking status: Never Smoker   . Smokeless tobacco: Not on file  . Alcohol Use: No     Colonoscopy:  PAP:  Bone density:  Lipid panel:  No Known Allergies  Current Outpatient Prescriptions  Medication Sig Dispense Refill  . acetaminophen (TYLENOL) 500 MG tablet Take 1,000 mg by mouth every 6 (six) hours as needed for moderate pain.    Marland Kitchen amLODipine (NORVASC) 2.5 MG tablet Take 1 tablet by mouth daily.  6  . aspirin 81 MG tablet Take 81 mg by mouth daily.    . cholecalciferol (VITAMIN D) 1000 UNITS tablet Take 2,000 Units by mouth daily.     Marland Kitchen gabapentin (NEURONTIN) 100 MG capsule Take 1 capsule (100 mg total) by mouth at bedtime. 30 capsule 6  . glimepiride (AMARYL) 4 MG tablet Take 4 mg by mouth daily.    Marland Kitchen losartan (COZAAR) 100 MG tablet Take 100 mg by mouth daily.     No current facility-administered medications for this visit.    OBJECTIVE: Filed Vitals:   11/15/14 1355  BP: 153/79  Pulse: 52  Temp: 96.7 F (35.9 C)  Resp: 18     Body mass index is 28.36 kg/(m^2).    ECOG FS:0 - Asymptomatic  General: Well-developed, well-nourished, no acute distress. Eyes: anicteric sclera. HEENT:  No palpable lymphadenopathy, mild fullness of left neck. Lungs: Clear to auscultation bilaterally. Heart: Regular rate and rhythm. No rubs, murmurs, or gallops. Abdomen: Soft, nontender, nondistended. No organomegaly noted, normoactive bowel sounds. Musculoskeletal: No edema, cyanosis, or clubbing. Neuro: Alert, answering all questions appropriately. Cranial nerves grossly intact. Skin: No rashes or petechiae noted. Psych: Normal affect.   LAB RESULTS:  Lab Results  Component Value Date   NA 138 07/24/2010   K 3.8 07/24/2010   CL 105 07/24/2010   CO2 25 07/24/2010   GLUCOSE 129* 07/24/2010   BUN 11 07/24/2010   CREATININE  0.93 07/24/2010   CALCIUM 8.3* 07/24/2010   GFRNONAA >60 07/24/2010   GFRAA  07/24/2010    >60        The eGFR has been calculated using the MDRD equation. This calculation has not been validated in all clinical situations. eGFR's persistently <60 mL/min signify possible Chronic Kidney Disease.    Lab Results  Component Value Date   WBC 7.5 11/15/2014   NEUTROABS 5.4 11/15/2014   HGB 12.8* 11/15/2014   HCT 37.4* 11/15/2014   MCV 88.7 11/15/2014   PLT 177 11/15/2014     STUDIES: Nm Pet Image Initial (pi) Skull Base To Thigh  11/08/2014   CLINICAL DATA:  Initial treatment strategy for rectal cancer.  EXAM: NUCLEAR MEDICINE PET SKULL BASE TO THIGH  TECHNIQUE: 12.5 mCi F-18 FDG was injected intravenously. Full-ring PET imaging was performed from the skull base to thigh after the radiotracer. CT data was obtained and used for attenuation correction and anatomic localization.  FASTING BLOOD GLUCOSE:  Value: 131 mg/dl  COMPARISON:  CT scan of 02/19/2010.  FINDINGS: NECK  2.5 cm soft tissue lesion in the region of the left oral pharynx is markedly hypermetabolic with SUV max = 44.0. CT images suggests that this is deep to the mucosa rather than a true mucosal lesion, but streak artifact from dental fillings hinders assessment. A small hypermetabolic focus is identified just posterior to the right mandible (see image 34 series 3). No definite underlying nodule or mass is evident.  CHEST  No hypermetabolic mediastinal or hilar nodes. No suspicious pulmonary nodules on the CT scan.  Heart is enlarged.  Coronary artery calcification is evident.  ABDOMEN/PELVIS  No abnormal hypermetabolic activity within the liver, pancreas, adrenal glands, or spleen. No hypermetabolic lymph nodes in the abdomen or pelvis.  Atherosclerotic calcification noted in the abdominal aorta. Areas of cortical scarring are seen in the kidneys, left greater than right. Brachytherapy seeds are present in the prostate region.   Focal marked hypermetabolism is seen in the right aspect of the distal rectum, and region of apparent irregular wall thickening on CT imaging (see image 268 series 3). No evidence for hypermetabolic pelvic sidewall lymphadenopathy.  SKELETON  No focal hypermetabolic activity to suggest skeletal metastasis.  IMPRESSION: 1. Focal hypermetabolism in an area of right-sided rectal wall thickening, compatible with the reported history of rectal neoplasm. 2. Unexpected markedly hypermetabolic 2.5 cm soft tissue lesion in the left oropharyngeal region, causing mass-effect on the oropharynx. Area is difficult to assess given streak artifact from dental fillings, but this lesion may be related to lymphadenopathy rather than an oropharyngeal mucosal lesion. FDG uptake is in the malignant range. This would be an unusual location for metastatic rectal cancer given the absence of other definite metastatic lesions on this study. 3. Brachytherapy seeds in the prostate bed. No evidence for hypermetabolic bone lesions. These results will be called to the ordering  clinician or representative by the Radiologist Assistant, and communication documented in the PACS or zVision Dashboard.   Electronically Signed   By: Misty Stanley M.D.   On: 11/08/2014 12:27    ASSESSMENT: Biopsy proven stage IIa rectal cancer, PET positive oropharyngeal  PLAN:    1. Rectal cancer: Confirmed by biopsy. EUS and PET scan results reviewed independently confirming stage. Patient also has a PET positive oropharyngeal mass. It is unclear if this is metastasis or possibly a second primary. Because of patient's history of brachytherapy to his prostate, he can no longer receive radiation to his pelvis and have recommended that patient proceed directly with surgery. He has an appointment next Tuesday to further discuss this. EUS noted involvement of his sphincter, therefore he will likely require an APR with colostomy.  He will benefit from adjuvant  chemotherapy, likely with FOLFOX. Because of this, have recommended port placement as well.  Return to clinic in 3 weeks for further evaluation. 2. PET positive oropharyngeal region:  Unclear etiology. Patient does report a history of a possible parotid stone several years ago. He is a nonsmoker, but does have a significant secondhand smoke history.  Approximately 30 minutes was spent in discussion and consultation.  Patient expressed understanding and was in agreement with this plan. He also understands that He can call clinic at any time with any questions, concerns, or complaints.   No matching staging information was found for the patient.  Lloyd Huger, MD   11/15/2014 3:06 PM

## 2014-11-15 NOTE — Progress Notes (Signed)
[place]  Telephone:(336) [phone] Fax:(336) [phone]  ID: Darryl Hooper OB: 08/04/1929  MR#: [id number]  JKD#:[id number]  Patient Care Team: Arlis Porta., MD as PCP - General (Family Medicine) Clent Jacks, RN as Registered Nurse  CHIEF COMPLAINT:  Chief Complaint  Patient presents with  . New Evaluation    rectal cancer    INTERVAL HISTORY: Patient is an 81 year old male who presented to the emergency room complaining of month-long diarrhea and stool incontinence. He was in his usual state of health until that point. He denies any bleeding or melanoma. He denies any pain. He has no weight loss. He denies any recent fevers. He denies any chest pain or shortness of breath. Patient otherwise feels well and offers no further specific complaints.  REVIEW OF SYSTEMS:   Review of Systems  Constitutional: Negative for fever, weight loss and malaise/fatigue.  Respiratory: Negative.   Cardiovascular: Negative.   Gastrointestinal: Positive for diarrhea. Negative for nausea, abdominal pain, blood in stool and melena.  Genitourinary: Negative.   Neurological: Negative for weakness.    As per HPI. Otherwise, a complete review of systems is negatve.  PAST MEDICAL HISTORY: Past Medical History  Diagnosis Date  . Hypertension   . Peripheral vascular disease   . Hyperlipidemia   . Diarrhea   . Diabetes mellitus without complication   . BPH (benign prostatic hyperplasia)   . Prostate cancer   . Arthritis   . Knee pain, bilateral   . Rectal cancer     PAST SURGICAL HISTORY: Past Surgical History  Procedure Laterality Date  . Prostatectomy    . Joint replacement      left knee  . Colonoscopy N/A 10/23/2014    Procedure: COLONOSCOPY;  Surgeon: Lucilla Lame, MD;  Location: [facility];  Service: Gastroenterology;  Laterality: N/A;  . Knee arthroscopy w/ meniscal repair Left 2012    FAMILY HISTORY Family History  Problem Relation Age of  Onset  . Heart disease Mother   . Heart disease Father   . Cancer Father 65    Colon  . Multiple sclerosis Son        ADVANCED DIRECTIVES:    HEALTH MAINTENANCE: History  Substance Use Topics  . Smoking status: Never Smoker   . Smokeless tobacco: Not on file  . Alcohol Use: No     Colonoscopy:  PAP:  Bone density:  Lipid panel:  No Known Allergies  Current Outpatient Prescriptions  Medication Sig Dispense Refill  . aspirin 81 MG tablet Take 81 mg by mouth daily.    . cholecalciferol (VITAMIN D) 1000 UNITS tablet Take 2,000 Units by mouth daily.     Marland Kitchen glimepiride (AMARYL) 4 MG tablet Take 4 mg by mouth daily.    Marland Kitchen acetaminophen (TYLENOL) 500 MG tablet Take 1,000 mg by mouth every 6 (six) hours as needed for moderate pain.    Marland Kitchen amLODipine (NORVASC) 2.5 MG tablet Take 1 tablet by mouth daily.  6  . gabapentin (NEURONTIN) 100 MG capsule Take 1 capsule (100 mg total) by mouth at bedtime. 30 capsule 6  . losartan (COZAAR) 100 MG tablet Take 100 mg by mouth daily.     No current facility-administered medications for this visit.    OBJECTIVE: Filed Vitals:   11/08/14 0931  BP: 200/90  Pulse: 49  Temp: 96.2 F (35.7 C)  Resp: 20     Body mass index is 28.55 kg/(m^2).    ECOG FS:0 - Asymptomatic  General: Well-developed, well-nourished, no acute distress. Eyes: Pink conjunctiva, anicteric sclera. HEENT: Normocephalic, moist mucous membranes, clear oropharnyx. Lungs: Clear to auscultation bilaterally. Heart: Regular rate and rhythm. No rubs, murmurs, or gallops. Abdomen: Soft, nontender, nondistended. No organomegaly noted, normoactive bowel sounds. Musculoskeletal: No edema, cyanosis, or clubbing. Neuro: Alert, answering all questions appropriately. Cranial nerves grossly intact. Skin: No rashes or petechiae noted. Psych: Normal affect. Lymphatics: No cervical, calvicular, axillary or inguinal LAD.   LAB RESULTS:  Lab Results  Component Value Date   NA 138  07/24/2010   K 3.8 07/24/2010   CL 105 07/24/2010   CO2 25 07/24/2010   GLUCOSE 129* 07/24/2010   BUN 11 07/24/2010   CREATININE 0.93 07/24/2010   CALCIUM 8.3* 07/24/2010   GFRNONAA >60 07/24/2010   GFRAA  07/24/2010    >60        The eGFR has been calculated using the MDRD equation. This calculation has not been validated in all clinical situations. eGFR's persistently <60 mL/min signify possible Chronic Kidney Disease.    Lab Results  Component Value Date   WBC 8.3 07/24/2010   HGB 9.6* 07/24/2010   HCT 28.3* 07/24/2010   MCV 87.1 07/24/2010   PLT 134* 07/24/2010     STUDIES: Nm Pet Image Initial (pi) Skull Base To Thigh  11/08/2014   CLINICAL DATA:  Initial treatment strategy for rectal cancer.  EXAM: NUCLEAR MEDICINE PET SKULL BASE TO THIGH  TECHNIQUE: 12.5 mCi F-18 FDG was injected intravenously. Full-ring PET imaging was performed from the skull base to thigh after the radiotracer. CT data was obtained and used for attenuation correction and anatomic localization.  FASTING BLOOD GLUCOSE:  Value: 131 mg/dl  COMPARISON:  CT scan of 02/19/2010.  FINDINGS: NECK  2.5 cm soft tissue lesion in the region of the left oral pharynx is markedly hypermetabolic with SUV max = 86.7. CT images suggests that this is deep to the mucosa rather than a true mucosal lesion, but streak artifact from dental fillings hinders assessment. A small hypermetabolic focus is identified just posterior to the right mandible (see image 34 series 3). No definite underlying nodule or mass is evident.  CHEST  No hypermetabolic mediastinal or hilar nodes. No suspicious pulmonary nodules on the CT scan.  Heart is enlarged.  Coronary artery calcification is evident.  ABDOMEN/PELVIS  No abnormal hypermetabolic activity within the liver, pancreas, adrenal glands, or spleen. No hypermetabolic lymph nodes in the abdomen or pelvis.  Atherosclerotic calcification noted in the abdominal aorta. Areas of cortical scarring are  seen in the kidneys, left greater than right. Brachytherapy seeds are present in the prostate region.  Focal marked hypermetabolism is seen in the right aspect of the distal rectum, and region of apparent irregular wall thickening on CT imaging (see image 268 series 3). No evidence for hypermetabolic pelvic sidewall lymphadenopathy.  SKELETON  No focal hypermetabolic activity to suggest skeletal metastasis.  IMPRESSION: 1. Focal hypermetabolism in an area of right-sided rectal wall thickening, compatible with the reported history of rectal neoplasm. 2. Unexpected markedly hypermetabolic 2.5 cm soft tissue lesion in the left oropharyngeal region, causing mass-effect on the oropharynx. Area is difficult to assess given streak artifact from dental fillings, but this lesion may be related to lymphadenopathy rather than an oropharyngeal mucosal lesion. FDG uptake is in the malignant range. This would be an unusual location for metastatic rectal cancer given the absence of other definite metastatic lesions on this study. 3. Brachytherapy seeds in the prostate bed. No  evidence for hypermetabolic bone lesions. These results will be called to the ordering clinician or representative by the Radiologist Assistant, and communication documented in the PACS or zVision Dashboard.   Electronically Signed   By: Misty Stanley M.D.   On: 11/08/2014 12:27    ASSESSMENT: Biopsy proven rectal cancer.  PLAN:    . Rectal cancer: Patient recently underwent colonoscopy with biopsy that confirmed rectal adenocarcinoma. He has a PET scan as well as an EUS scheduled for later this week to complete staging workup. Patient will return to clinic in 1 week for further evaluation and treatment planning. Given patient's history of brachytherapy for prostate cancer approximately 15 years ago, radiation may be difficult but will further discuss the case with Dr. Baruch Gouty. Patient expressed understanding and was in agreement with this  plan.  Patient expressed understanding and was in agreement with this plan. He also understands that He can call clinic at any time with any questions, concerns, or complaints.   No matching staging information was found for the patient.  Lloyd Huger, MD   11/15/2014 12:21 PM

## 2014-11-18 ENCOUNTER — Encounter: Payer: Self-pay | Admitting: Oncology

## 2014-11-18 ENCOUNTER — Institutional Professional Consult (permissible substitution): Payer: Medicare Other | Admitting: Radiation Oncology

## 2014-11-18 NOTE — Progress Notes (Signed)
Patient has appt with Dr. Charolett Bumpers 11/21/14, they have already notified patient.

## 2014-11-19 ENCOUNTER — Ambulatory Visit (INDEPENDENT_AMBULATORY_CARE_PROVIDER_SITE_OTHER): Payer: Medicare Other | Admitting: Surgery

## 2014-11-19 ENCOUNTER — Encounter: Payer: Self-pay | Admitting: Surgery

## 2014-11-19 VITALS — BP 153/83 | HR 69 | Temp 99.5°F | Ht 74.0 in | Wt 222.2 lb

## 2014-11-19 DIAGNOSIS — E1140 Type 2 diabetes mellitus with diabetic neuropathy, unspecified: Secondary | ICD-10-CM

## 2014-11-19 DIAGNOSIS — C20 Malignant neoplasm of rectum: Secondary | ICD-10-CM

## 2014-11-19 DIAGNOSIS — K529 Noninfective gastroenteritis and colitis, unspecified: Secondary | ICD-10-CM | POA: Diagnosis not present

## 2014-11-19 DIAGNOSIS — I999 Unspecified disorder of circulatory system: Secondary | ICD-10-CM | POA: Diagnosis not present

## 2014-11-19 MED ORDER — ENOXAPARIN SODIUM 150 MG/ML ~~LOC~~ SOLN: 40.0000 mg | SUBCUTANEOUS | Status: DC

## 2014-11-19 MED ORDER — POLYETHYLENE GLYCOL 3350 17 GM/SCOOP PO POWD: ORAL | Status: DC

## 2014-11-19 MED ORDER — BISACODYL 5 MG PO TBEC: DELAYED_RELEASE_TABLET | ORAL | Status: DC

## 2014-11-19 MED ORDER — ERTAPENEM SODIUM 1 G IJ SOLR: 1.0000 g | Freq: Once | INTRAMUSCULAR | Status: DC

## 2014-11-19 NOTE — Progress Notes (Signed)
Patient ID: Darryl Hooper, male   DOB: 04-Aug-1929, 81 y.o.   MRN: [id number]  Chief Complaint  Patient presents with  . Rectal Cancer    HPI   Darryl Hooper is a 81 y.o. male.   With a known history of a distal rectal adenocarcinoma. It is T3 N0 by EUA and PET/CT scan demonstrates activity within the pelvis and the oral pharynx. He is to see ENT on Thursday of this week. He is not a smoker. He continues to have fecal incontinence. He also has urinary incontinence.   That he is seen in the office with his wife as well as daughter. I spoke with oncology last week and due to the fact that he's had brachii therapy to prostate cancer in the past external beam radiation is no longer useful. Of note the patient states that he had a pulmonary embolism and DVT approximate 5 years ago for which he was on Coumadin for 6 months. The patient denies any shortness of breath with exertion. No chest pain. No history of myocardial infarction or history of coronary artery disease.  he was seen by Dr. Grayland Ormond of the cancer center and referred back to surgery as primary therapy. Plan is for FOLFOX therapy postoperatively.   HPI  Past Medical History  Diagnosis Date  . Hypertension   . Peripheral vascular disease   . Hyperlipidemia   . Diarrhea   . Diabetes mellitus without complication   . BPH (benign prostatic hyperplasia)   . Prostate cancer   . Arthritis   . Knee pain, bilateral   . Rectal cancer     Past Surgical History  Procedure Laterality Date  . Prostatectomy    . Joint replacement      left knee  . Colonoscopy N/A 10/23/2014    Procedure: COLONOSCOPY;  Surgeon: Lucilla Lame, MD;  Location: [place] [facility];  Service: Gastroenterology;  Laterality: N/A;  . Knee arthroscopy w/ meniscal repair Left 2012    Family History  Problem Relation Age of Onset  . Heart disease Mother   . Heart disease Father   . Cancer Father 21    Colon  . Multiple sclerosis Son     Social  History History  Substance Use Topics  . Smoking status: Never Smoker   . Smokeless tobacco: Not on file  . Alcohol Use: No    No Known Allergies  Current Outpatient Prescriptions  Medication Sig Dispense Refill  . acetaminophen (TYLENOL) 500 MG tablet Take 1,000 mg by mouth every 6 (six) hours as needed for moderate pain.    Marland Kitchen amLODipine (NORVASC) 2.5 MG tablet Take 1 tablet by mouth daily.  6  . aspirin 81 MG tablet Take 81 mg by mouth daily.    . cholecalciferol (VITAMIN D) 1000 UNITS tablet Take 2,000 Units by mouth daily.     Marland Kitchen gabapentin (NEURONTIN) 100 MG capsule Take 1 capsule (100 mg total) by mouth at bedtime. 30 capsule 6  . glimepiride (AMARYL) 4 MG tablet Take 4 mg by mouth daily.    Marland Kitchen losartan (COZAAR) 100 MG tablet Take 100 mg by mouth daily.     No current facility-administered medications for this visit.      Review of Systems A 10 point review of systems was asked and was negative except for the following positive findings.  Blood pressure 153/83, pulse 69, temperature 99.5 F (37.5 C), temperature source Oral, height 6\' 2"  (1.88 m), weight 222 lb 3.2 oz (  100.789 kg).  Physical Exam CONSTITUTIONAL:  Pleasant, well-developed, well-nourished, and in no acute distress. EYES: Pupils equal and reactive to light, Sclera non-icteric EARS, NOSE, MOUTH AND THROAT:  The oropharynx was clear.  Dentition is good repair.  Oral mucosa pink and moist. LYMPH NODES:  Lymph nodes in the neck and axillae were normal RESPIRATORY:  Lungs were clear.  Normal respiratory effort without pathologic use of accessory muscles of respiration CARDIOVASCULAR: Heart was regular without murmurs.  There were no carotid bruits. GI: The abdomen was soft, nontender, and nondistended. There were no palpable masses. There was no hepatosplenomegaly. There were normal bowel sounds in all quadrants. GU:  Rectal shows distal rectal mass at schincters, mobile in nature.    MUSCULOSKELETAL:  Normal  muscle strength and tone.  No clubbing or cyanosis.   SKIN:  There were no pathologic skin lesions.  There were no nodules on palpation. NEUROLOGIC:  Sensation is normal.  Cranial nerves are grossly intact. PSYCH:  Oriented to person, place and time.  Mood and affect are normal.  Data Reviewed  Outside records from Dr. Grayland Ormond as well as a PET CT scan and ultrasound of the rectum  Personally reviewed and summarized. I have personally reviewed the patient's imaging, laboratory findings and medical records.    Assessment     Clinical stage IIA distal rectal cancer with fecal incontinence pre-op.    Plan     I discussed with the patient and his family extensively abdominal perineal resection as primary surgical intervention. I discussed with them the risks of wound infection,  Ureteral injury, PE and DVT risks and expected 7-10 day hospital stay along with post-hospitalization rehab stay based on clinical course.     Time spent with patient was 60 minutes, with more than 50% of the time spent counseling and coordinating care of patient.     [name] 11/19/2014, 2:04 PM

## 2014-11-19 NOTE — Patient Instructions (Addendum)
Please use Zinc Oxide on your buttocks to keep skin from becoming sore and irritated.  Your surgery is scheduled on 11/28/14. Please see pre-surgical form enclosed.  You will need to Prep your Colon for surgery, please read the instructions that I have enclosed.  You will also be set-up for a pre-admission appointment. Our surgery scheduler will call you with the date and time of this appointment.

## 2014-11-20 ENCOUNTER — Telehealth: Payer: Self-pay

## 2014-11-20 NOTE — Addendum Note (Signed)
Addended by: [name] on: 11/20/2014 03:38 PM   Modules accepted: Orders

## 2014-11-20 NOTE — Telephone Encounter (Signed)
Please schedule patient for surgery on 12/12/14 with Dr. Marina Gravel and Dr. Pat Patrick assisting.   Urology on-call MD for that day will also be needed for uretal stent placement.  Block 4 hours for this case please.  Reviewed Pre-surgical infromation, and full colon prep at visit, CBC and BMP were placed, and meds were sent to pharmacy for prep.  Please schedule OR, Pre-admit, and notify patient.

## 2014-11-21 DIAGNOSIS — R1310 Dysphagia, unspecified: Secondary | ICD-10-CM | POA: Diagnosis not present

## 2014-11-21 DIAGNOSIS — K111 Hypertrophy of salivary gland: Secondary | ICD-10-CM | POA: Diagnosis not present

## 2014-11-21 NOTE — Telephone Encounter (Signed)
Patient called in to request all of his surgery and Pre-admit information. Please call him back on his cell phone.

## 2014-11-21 NOTE — Telephone Encounter (Signed)
I have called pt back to discuss sx date and pre op date and time.  Pre op: 12/03/14 @ 9:45am. Sx date: 12/12/14 with Dr Marina Gravel. Dr Pat Patrick assisting and Dr Erlene Quan.

## 2014-11-25 ENCOUNTER — Encounter: Payer: Self-pay | Admitting: Internal Medicine

## 2014-11-25 ENCOUNTER — Telehealth: Payer: Self-pay | Admitting: Surgery

## 2014-11-25 NOTE — Telephone Encounter (Signed)
Dr.Juengel phoned our office today asking for Darryl Hooper who was not in the office at the time, He asked to let Dr.Bird know pt had a positive Neck CT scan but that he didn't see anything and was able to pursue with his SX. I sent [email or web address] an e-mail with this information and a contact where Dr.Juengel could be reached.

## 2014-11-25 NOTE — Telephone Encounter (Signed)
I have called pt to inform of pre op date/time and sx date. Sx: 12/12/14 with Dr Marina Gravel, Dr Pat Patrick to assist and Dr Erlene Quan for stent placement.  Pre op: office pre op: 12/03/14 @ 9:45am.  No answer. I have left him a msg on vm.

## 2014-11-28 NOTE — Telephone Encounter (Signed)
I have contacted pt to advise him of his sx date and pre op date and time. Pt was ok with dates and times.

## 2014-12-03 ENCOUNTER — Telehealth: Payer: Self-pay | Admitting: Urology

## 2014-12-03 ENCOUNTER — Encounter
Admission: RE | Admit: 2014-12-03 | Discharge: 2014-12-03 | Disposition: A | Discharge status: Home / Self Care | Payer: Medicare Other | Source: Ambulatory Visit | Attending: Surgery | Admitting: Surgery

## 2014-12-03 DIAGNOSIS — Z01810 Encounter for preprocedural cardiovascular examination: Secondary | ICD-10-CM | POA: Insufficient documentation

## 2014-12-03 DIAGNOSIS — I10 Essential (primary) hypertension: Secondary | ICD-10-CM | POA: Diagnosis not present

## 2014-12-03 DIAGNOSIS — Z01812 Encounter for preprocedural laboratory examination: Secondary | ICD-10-CM | POA: Insufficient documentation

## 2014-12-03 HISTORY — DX: Other specified diabetes mellitus with diabetic neuropathy, unspecified: E13.40

## 2014-12-03 HISTORY — DX: Personal history of pulmonary embolism: Z86.711

## 2014-12-03 HISTORY — DX: Irritable bowel syndrome, unspecified: K58.9

## 2014-12-03 HISTORY — DX: Chronic kidney disease, unspecified: N18.9

## 2014-12-03 LAB — COMPREHENSIVE METABOLIC PANEL
ALT: 13 U/L — ABNORMAL LOW (ref 17–63)
ANION GAP: 8 (ref 5–15)
AST: 19 U/L (ref 15–41)
Albumin: 3.8 g/dL (ref 3.5–5.0)
Alkaline Phosphatase: 48 U/L (ref 38–126)
BILIRUBIN TOTAL: 0.3 mg/dL (ref 0.3–1.2)
BUN: 24 mg/dL — AB (ref 6–20)
CHLORIDE: 109 mmol/L (ref 101–111)
CO2: 23 mmol/L (ref 22–32)
Calcium: 9.4 mg/dL (ref 8.9–10.3)
Creatinine, Ser: 1.14 mg/dL (ref 0.61–1.24)
GFR calc Af Amer: >60 mL/min (ref >60)
GFR, EST NON AFRICAN AMERICAN: 58 mL/min — AB (ref >60)
GLUCOSE: 116 mg/dL — AB (ref 65–99)
Potassium: 4.2 mmol/L (ref 3.5–5.1)
Sodium: 140 mmol/L (ref 135–145)
Total Protein: 6.7 g/dL (ref 6.5–8.1)

## 2014-12-03 LAB — CBC WITH DIFFERENTIAL/PLATELET
Basophils Absolute: 0 10*3/uL (ref 0–0.1)
Basophils Relative: 1 %
EOS ABS: 0.2 10*3/uL (ref 0–0.7)
Eosinophils Relative: 2 %
HEMATOCRIT: 37 % — AB (ref 40.0–52.0)
Hemoglobin: 12.1 g/dL — ABNORMAL LOW (ref 13.0–18.0)
LYMPHS ABS: 0.9 10*3/uL — AB (ref 1.0–3.6)
LYMPHS PCT: 12 %
MCH: 29.8 pg (ref 26.0–34.0)
MCHC: 32.7 g/dL (ref 32.0–36.0)
MCV: 91.2 fL (ref 80.0–100.0)
MONO ABS: 0.6 10*3/uL (ref 0.2–1.0)
Monocytes Relative: 8 %
NEUTROS ABS: 5.6 10*3/uL (ref 1.4–6.5)
NEUTROS PCT: 77 %
Platelets: 198 10*3/uL (ref 150–440)
RBC: 4.06 MIL/uL — AB (ref 4.40–5.90)
RDW: 14.2 % (ref 11.5–14.5)
WBC: 7.2 10*3/uL (ref 3.8–10.6)

## 2014-12-03 LAB — TYPE AND SCREEN
ABO/RH(D): A POS
Antibody Screen: NEGATIVE

## 2014-12-03 LAB — ABO/RH: ABO/RH(D): A POS

## 2014-12-03 NOTE — Patient Instructions (Signed)
  Your procedure is scheduled on: July 14, (Thursday) Report to Day Surgery. To find out your arrival time please call [phone] between 1PM - 3PM on December 11, 2014 (Wednesday) Remember: Instructions that are not followed completely may result in serious medical risk, up to and including death, or upon the discretion of your surgeon and anesthesiologist your surgery may need to be rescheduled.    __x__ 1. Do not eat food or drink liquids after midnight. No gum chewing or hard candies. (FOLLOW DR. BIRD COLON PREP)    ____ 2. No Alcohol for 24 hours before or after surgery.   ____ 3. Bring all medications with you on the day of surgery if instructed.    __x__ 4. Notify your doctor if there is any change in your medical condition     (cold, fever, infections).     Do not wear jewelry, make-up, hairpins, clips or nail polish.  Do not wear lotions, powders, or perfumes. You may wear deodorant.  Do not shave 48 hours prior to surgery. Men may shave face and neck.  Do not bring valuables to the hospital.    [facility] is not responsible for any belongings or valuables.               Contacts, dentures or bridgework may not be worn into surgery.  Leave your suitcase in the car. After surgery it may be brought to your room.  For patients admitted to the hospital, discharge time is determined by your                treatment team.   Patients discharged the day of surgery will not be allowed to drive home.   Please read over the following fact sheets that you were given:   Surgical Site Infection Prevention   __x__ Take these medicines the morning of surgery with A SIP OF WATER:    1. Amlodipine  2. Losartan         ____ Fleet Enema (as directed)   _x___ Use CHG Soap as directed  ____ Use inhalers on the day of surgery  ____ Stop metformin 2 days prior to surgery    ____ Take 1/2 of usual insulin dose the night before surgery and none on the morning of surgery.   __x__ Stop  Coumadin/Plavix/aspirin on (PATIENT STATED HE HAS STOPPED ASPIRIN)  ____ Stop Anti-inflammatories on    ____ Stop supplements until after surgery.    ____ Bring C-Pap to the hospital.

## 2014-12-03 NOTE — Telephone Encounter (Signed)
Darryl Hooper from Pre-admin called about the pt and his surgery on 12/12/14 with Dr.Brandon. She says that she need his history and physical. Darryl Hooper did not leave a contact number to reach her at.  12/03/14 [facility]

## 2014-12-04 NOTE — Telephone Encounter (Signed)
This is a case we are assisting on we have not seen the patient and Dr. Algernon Huxley office will be providing the [place], Gail at pre-admit was notified of this. It looks like surgery may have been cancelled looking in chart review Baker Janus states she will call Dr. Algernon Huxley office to confirm/SW

## 2014-12-06 ENCOUNTER — Ambulatory Visit: Payer: Medicare Other | Admitting: Oncology

## 2014-12-06 ENCOUNTER — Other Ambulatory Visit: Payer: Medicare Other

## 2014-12-10 ENCOUNTER — Telehealth: Payer: Self-pay

## 2014-12-10 ENCOUNTER — Ambulatory Visit: Payer: Medicare Other | Admitting: Family Medicine

## 2014-12-10 NOTE — Telephone Encounter (Signed)
Spoke with patient and patient's wife to ensure that he still has his Bowel Prep instructions and has picked up medications.  He states that he has picked up both medications and gatorade. He also read prep instructions to me over the phone.  Pt and spouse has no further questions at this time.  Reminded patient to call tomorrow between 1-3pm to get arrival time. He verifies understanding of this.

## 2014-12-11 MED ORDER — CHLORHEXIDINE GLUCONATE 4 % EX LIQD: 1.0000 "application " | Freq: Once | CUTANEOUS | Status: DC

## 2014-12-11 MED ORDER — ALVIMOPAN 12 MG PO CAPS
12.0000 mg | ORAL_CAPSULE | Freq: Once | ORAL | Status: AC
  Administered 2014-12-12: 12 mg via ORAL

## 2014-12-11 MED ORDER — SODIUM CHLORIDE 0.9 % IV SOLN
1.0000 g | INTRAVENOUS | Status: DC
  Filled 2014-12-11: qty 1

## 2014-12-12 ENCOUNTER — Encounter: Payer: Self-pay | Admitting: Anesthesiology

## 2014-12-12 ENCOUNTER — Inpatient Hospital Stay: Payer: Medicare Other

## 2014-12-12 ENCOUNTER — Inpatient Hospital Stay
Admission: RE | Admit: 2014-12-12 | Discharge: 2014-12-31 | DRG: 330 | Disposition: A | Discharge status: Skilled Nursing Facility | Payer: Medicare Other | Source: Ambulatory Visit | Attending: Surgery | Admitting: Surgery

## 2014-12-12 ENCOUNTER — Inpatient Hospital Stay: Payer: Medicare Other | Admitting: Anesthesiology

## 2014-12-12 ENCOUNTER — Encounter
Admission: RE | Disposition: A | Discharge status: Skilled Nursing Facility | Payer: Self-pay | Source: Ambulatory Visit | Attending: Surgery

## 2014-12-12 DIAGNOSIS — R509 Fever, unspecified: Secondary | ICD-10-CM

## 2014-12-12 DIAGNOSIS — Z9079 Acquired absence of other genital organ(s): Secondary | ICD-10-CM | POA: Diagnosis present

## 2014-12-12 DIAGNOSIS — R262 Difficulty in walking, not elsewhere classified: Secondary | ICD-10-CM | POA: Diagnosis not present

## 2014-12-12 DIAGNOSIS — Z8546 Personal history of malignant neoplasm of prostate: Secondary | ICD-10-CM

## 2014-12-12 DIAGNOSIS — T8130XA Disruption of wound, unspecified, initial encounter: Secondary | ICD-10-CM | POA: Diagnosis present

## 2014-12-12 DIAGNOSIS — M6281 Muscle weakness (generalized): Secondary | ICD-10-CM | POA: Diagnosis not present

## 2014-12-12 DIAGNOSIS — T8189XA Other complications of procedures, not elsewhere classified, initial encounter: Secondary | ICD-10-CM | POA: Diagnosis not present

## 2014-12-12 DIAGNOSIS — I739 Peripheral vascular disease, unspecified: Secondary | ICD-10-CM | POA: Diagnosis present

## 2014-12-12 DIAGNOSIS — I10 Essential (primary) hypertension: Secondary | ICD-10-CM | POA: Diagnosis not present

## 2014-12-12 DIAGNOSIS — E785 Hyperlipidemia, unspecified: Secondary | ICD-10-CM | POA: Diagnosis present

## 2014-12-12 DIAGNOSIS — E559 Vitamin D deficiency, unspecified: Secondary | ICD-10-CM | POA: Diagnosis not present

## 2014-12-12 DIAGNOSIS — Z809 Family history of malignant neoplasm, unspecified: Secondary | ICD-10-CM

## 2014-12-12 DIAGNOSIS — T814XXA Infection following a procedure, initial encounter: Secondary | ICD-10-CM | POA: Diagnosis not present

## 2014-12-12 DIAGNOSIS — Z9889 Other specified postprocedural states: Secondary | ICD-10-CM | POA: Diagnosis not present

## 2014-12-12 DIAGNOSIS — Z86711 Personal history of pulmonary embolism: Secondary | ICD-10-CM | POA: Diagnosis not present

## 2014-12-12 DIAGNOSIS — R34 Anuria and oliguria: Secondary | ICD-10-CM

## 2014-12-12 DIAGNOSIS — Z9849 Cataract extraction status, unspecified eye: Secondary | ICD-10-CM

## 2014-12-12 DIAGNOSIS — N179 Acute kidney failure, unspecified: Secondary | ICD-10-CM | POA: Diagnosis present

## 2014-12-12 DIAGNOSIS — M1990 Unspecified osteoarthritis, unspecified site: Secondary | ICD-10-CM | POA: Diagnosis present

## 2014-12-12 DIAGNOSIS — Z5189 Encounter for other specified aftercare: Secondary | ICD-10-CM | POA: Diagnosis not present

## 2014-12-12 DIAGNOSIS — N134 Hydroureter: Secondary | ICD-10-CM | POA: Diagnosis present

## 2014-12-12 DIAGNOSIS — N2889 Other specified disorders of kidney and ureter: Secondary | ICD-10-CM | POA: Diagnosis not present

## 2014-12-12 DIAGNOSIS — I129 Hypertensive chronic kidney disease with stage 1 through stage 4 chronic kidney disease, or unspecified chronic kidney disease: Secondary | ICD-10-CM | POA: Diagnosis not present

## 2014-12-12 DIAGNOSIS — S31501A Unspecified open wound of unspecified external genital organs, male, initial encounter: Secondary | ICD-10-CM | POA: Diagnosis not present

## 2014-12-12 DIAGNOSIS — Z820 Family history of epilepsy and other diseases of the nervous system: Secondary | ICD-10-CM

## 2014-12-12 DIAGNOSIS — N289 Disorder of kidney and ureter, unspecified: Secondary | ICD-10-CM | POA: Diagnosis not present

## 2014-12-12 DIAGNOSIS — Z8249 Family history of ischemic heart disease and other diseases of the circulatory system: Secondary | ICD-10-CM

## 2014-12-12 DIAGNOSIS — R934 Abnormal findings on diagnostic imaging of urinary organs: Secondary | ICD-10-CM | POA: Diagnosis not present

## 2014-12-12 DIAGNOSIS — I96 Gangrene, not elsewhere classified: Secondary | ICD-10-CM | POA: Diagnosis not present

## 2014-12-12 DIAGNOSIS — N400 Enlarged prostate without lower urinary tract symptoms: Secondary | ICD-10-CM | POA: Diagnosis present

## 2014-12-12 DIAGNOSIS — L02215 Cutaneous abscess of perineum: Secondary | ICD-10-CM | POA: Diagnosis not present

## 2014-12-12 DIAGNOSIS — N3289 Other specified disorders of bladder: Secondary | ICD-10-CM | POA: Diagnosis present

## 2014-12-12 DIAGNOSIS — D649 Anemia, unspecified: Secondary | ICD-10-CM | POA: Diagnosis present

## 2014-12-12 DIAGNOSIS — K589 Irritable bowel syndrome without diarrhea: Secondary | ICD-10-CM | POA: Diagnosis present

## 2014-12-12 DIAGNOSIS — T8131XA Disruption of external operation (surgical) wound, not elsewhere classified, initial encounter: Secondary | ICD-10-CM | POA: Diagnosis not present

## 2014-12-12 DIAGNOSIS — E1140 Type 2 diabetes mellitus with diabetic neuropathy, unspecified: Secondary | ICD-10-CM | POA: Diagnosis present

## 2014-12-12 DIAGNOSIS — N189 Chronic kidney disease, unspecified: Secondary | ICD-10-CM | POA: Diagnosis present

## 2014-12-12 DIAGNOSIS — E119 Type 2 diabetes mellitus without complications: Secondary | ICD-10-CM | POA: Diagnosis not present

## 2014-12-12 DIAGNOSIS — Z79899 Other long term (current) drug therapy: Secondary | ICD-10-CM

## 2014-12-12 DIAGNOSIS — R5383 Other fatigue: Secondary | ICD-10-CM | POA: Diagnosis not present

## 2014-12-12 DIAGNOSIS — C20 Malignant neoplasm of rectum: Principal | ICD-10-CM | POA: Diagnosis present

## 2014-12-12 DIAGNOSIS — C61 Malignant neoplasm of prostate: Secondary | ICD-10-CM | POA: Diagnosis not present

## 2014-12-12 DIAGNOSIS — I252 Old myocardial infarction: Secondary | ICD-10-CM | POA: Diagnosis not present

## 2014-12-12 HISTORY — PX: CYSTOSCOPY WITH STENT PLACEMENT: SHX5790

## 2014-12-12 HISTORY — PX: ABDOMINAL PERINEAL BOWEL RESECTION: SHX1111

## 2014-12-12 LAB — GLUCOSE, CAPILLARY
GLUCOSE-CAPILLARY: 224 mg/dL — AB (ref 65–99)
Glucose-Capillary: 137 mg/dL — ABNORMAL HIGH (ref 65–99)
Glucose-Capillary: 220 mg/dL — ABNORMAL HIGH (ref 65–99)
Glucose-Capillary: 276 mg/dL — ABNORMAL HIGH (ref 65–99)

## 2014-12-12 SURGERY — PROCTECTOMY, ABDOMINOPERINEAL
Anesthesia: General

## 2014-12-12 MED ORDER — KCL IN DEXTROSE-NACL 20-5-0.45 MEQ/L-%-% IV SOLN
INTRAVENOUS | Status: DC
  Administered 2014-12-12 – 2014-12-13 (×3): via INTRAVENOUS
  Filled 2014-12-12 (×4): qty 1000

## 2014-12-12 MED ORDER — FENTANYL CITRATE (PF) 100 MCG/2ML IJ SOLN: 25.0000 ug | INTRAMUSCULAR | Status: DC | PRN

## 2014-12-12 MED ORDER — ACETAMINOPHEN 10 MG/ML IV SOLN
INTRAVENOUS | Status: DC | PRN
  Administered 2014-12-12: 1000 mg via INTRAVENOUS

## 2014-12-12 MED ORDER — SUGAMMADEX SODIUM 200 MG/2ML IV SOLN
INTRAVENOUS | Status: DC | PRN
  Administered 2014-12-12: 199.6 mg via INTRAVENOUS

## 2014-12-12 MED ORDER — IOTHALAMATE MEGLUMINE 43 % IV SOLN
INTRAVENOUS | Status: DC | PRN
  Administered 2014-12-12: 15 mL via URETHRAL

## 2014-12-12 MED ORDER — DEXMEDETOMIDINE HCL IN NACL 200 MCG/50ML IV SOLN
INTRAVENOUS | Status: DC | PRN
  Administered 2014-12-12: 8 ug via INTRAVENOUS

## 2014-12-12 MED ORDER — MICROFIBRILLAR COLL HEMOSTAT EX POWD
CUTANEOUS | Status: AC
  Filled 2014-12-12: qty 5

## 2014-12-12 MED ORDER — PHENYLEPHRINE HCL 10 MG/ML IJ SOLN
INTRAMUSCULAR | Status: DC | PRN
  Administered 2014-12-12 (×3): 100 ug via INTRAVENOUS

## 2014-12-12 MED ORDER — ASPIRIN EC 81 MG PO TBEC
81.0000 mg | DELAYED_RELEASE_TABLET | Freq: Every day | ORAL | Status: DC
  Administered 2014-12-13 – 2014-12-31 (×17): 81 mg via ORAL
  Filled 2014-12-12 (×17): qty 1

## 2014-12-12 MED ORDER — LIDOCAINE HCL (CARDIAC) 20 MG/ML IV SOLN
INTRAVENOUS | Status: DC | PRN
  Administered 2014-12-12: 100 mg via INTRAVENOUS

## 2014-12-12 MED ORDER — LOSARTAN POTASSIUM 50 MG PO TABS
100.0000 mg | ORAL_TABLET | Freq: Every day | ORAL | Status: DC
Start: 2014-12-12 — End: 2014-12-31
  Administered 2014-12-13 – 2014-12-31 (×17): 100 mg via ORAL
  Filled 2014-12-12 (×18): qty 2

## 2014-12-12 MED ORDER — ONDANSETRON HCL 4 MG/2ML IJ SOLN
INTRAMUSCULAR | Status: DC | PRN
  Administered 2014-12-12: 4 mg via INTRAVENOUS

## 2014-12-12 MED ORDER — ONDANSETRON HCL 4 MG/2ML IJ SOLN: 4.0000 mg | Freq: Four times a day (QID) | INTRAMUSCULAR | Status: DC | PRN

## 2014-12-12 MED ORDER — SODIUM CHLORIDE 0.9 % IJ SOLN
3.0000 mL | Freq: Two times a day (BID) | INTRAMUSCULAR | Status: DC
  Administered 2014-12-12 – 2014-12-29 (×24): 3 mL via INTRAVENOUS

## 2014-12-12 MED ORDER — 0.9 % SODIUM CHLORIDE (POUR BTL) OPTIME
TOPICAL | Status: DC | PRN
  Administered 2014-12-12: 1000 mL

## 2014-12-12 MED ORDER — EPHEDRINE SULFATE 50 MG/ML IJ SOLN
INTRAMUSCULAR | Status: DC | PRN
  Administered 2014-12-12 (×2): 10 mg via INTRAVENOUS

## 2014-12-12 MED ORDER — LEVOFLOXACIN IN D5W 500 MG/100ML IV SOLN
500.0000 mg | Freq: Once | INTRAVENOUS | Status: AC
  Administered 2014-12-12: 500 mg via INTRAVENOUS

## 2014-12-12 MED ORDER — INSULIN ASPART 100 UNIT/ML ~~LOC~~ SOLN
0.0000 [IU] | Freq: Three times a day (TID) | SUBCUTANEOUS | Status: DC
  Administered 2014-12-12: 5 [IU] via SUBCUTANEOUS
  Administered 2014-12-13: 2 [IU] via SUBCUTANEOUS
  Administered 2014-12-13: 5 [IU] via SUBCUTANEOUS
  Administered 2014-12-13: 2 [IU] via SUBCUTANEOUS
  Administered 2014-12-14 (×3): 3 [IU] via SUBCUTANEOUS
  Administered 2014-12-15: 2 [IU] via SUBCUTANEOUS
  Administered 2014-12-15: 3 [IU] via SUBCUTANEOUS
  Administered 2014-12-15: 2 [IU] via SUBCUTANEOUS
  Administered 2014-12-16 (×2): 3 [IU] via SUBCUTANEOUS
  Administered 2014-12-17: 2 [IU] via SUBCUTANEOUS
  Administered 2014-12-17: 3 [IU] via SUBCUTANEOUS
  Administered 2014-12-17 – 2014-12-18 (×2): 2 [IU] via SUBCUTANEOUS
  Administered 2014-12-18 – 2014-12-19 (×3): 3 [IU] via SUBCUTANEOUS
  Administered 2014-12-19: 2 [IU] via SUBCUTANEOUS
  Administered 2014-12-20: 3 [IU] via SUBCUTANEOUS
  Administered 2014-12-20: 5 [IU] via SUBCUTANEOUS
  Administered 2014-12-20: 3 [IU] via SUBCUTANEOUS
  Administered 2014-12-21 (×2): 5 [IU] via SUBCUTANEOUS
  Administered 2014-12-21 – 2014-12-22 (×3): 3 [IU] via SUBCUTANEOUS
  Administered 2014-12-22: 5 [IU] via SUBCUTANEOUS
  Administered 2014-12-23: 2 [IU] via SUBCUTANEOUS
  Administered 2014-12-23 – 2014-12-24 (×4): 5 [IU] via SUBCUTANEOUS
  Administered 2014-12-25: 3 [IU] via SUBCUTANEOUS
  Administered 2014-12-25: 2 [IU] via SUBCUTANEOUS
  Administered 2014-12-26 (×2): 3 [IU] via SUBCUTANEOUS
  Administered 2014-12-26 – 2014-12-27 (×2): 8 [IU] via SUBCUTANEOUS
  Administered 2014-12-27: 5 [IU] via SUBCUTANEOUS
  Administered 2014-12-27 – 2014-12-28 (×3): 3 [IU] via SUBCUTANEOUS
  Administered 2014-12-28: 5 [IU] via SUBCUTANEOUS
  Administered 2014-12-29: 3 [IU] via SUBCUTANEOUS
  Administered 2014-12-29: 5 [IU] via SUBCUTANEOUS
  Administered 2014-12-29 – 2014-12-30 (×3): 3 [IU] via SUBCUTANEOUS
  Administered 2014-12-31: 5 [IU] via SUBCUTANEOUS
  Administered 2014-12-31: 3 [IU] via SUBCUTANEOUS
  Filled 2014-12-12: qty 5
  Filled 2014-12-12: qty 3
  Filled 2014-12-12: qty 1
  Filled 2014-12-12: qty 5
  Filled 2014-12-12: qty 3
  Filled 2014-12-12 (×3): qty 2
  Filled 2014-12-12 (×5): qty 3
  Filled 2014-12-12: qty 5
  Filled 2014-12-12 (×2): qty 3
  Filled 2014-12-12: qty 5
  Filled 2014-12-12: qty 3
  Filled 2014-12-12 (×2): qty 5
  Filled 2014-12-12: qty 3
  Filled 2014-12-12: qty 2
  Filled 2014-12-12 (×2): qty 3
  Filled 2014-12-12: qty 5
  Filled 2014-12-12 (×3): qty 3
  Filled 2014-12-12: qty 5
  Filled 2014-12-12: qty 3
  Filled 2014-12-12: qty 8
  Filled 2014-12-12: qty 3
  Filled 2014-12-12: qty 2
  Filled 2014-12-12: qty 3
  Filled 2014-12-12: qty 5
  Filled 2014-12-12 (×2): qty 2
  Filled 2014-12-12: qty 5
  Filled 2014-12-12 (×2): qty 3
  Filled 2014-12-12: qty 2
  Filled 2014-12-12: qty 3
  Filled 2014-12-12: qty 2
  Filled 2014-12-12 (×2): qty 5
  Filled 2014-12-12: qty 2
  Filled 2014-12-12 (×2): qty 3
  Filled 2014-12-12: qty 8
  Filled 2014-12-12: qty 3
  Filled 2014-12-12 (×2): qty 5
  Filled 2014-12-12 (×2): qty 3

## 2014-12-12 MED ORDER — FENTANYL CITRATE (PF) 100 MCG/2ML IJ SOLN
INTRAMUSCULAR | Status: DC | PRN
  Administered 2014-12-12: 100 ug via INTRAVENOUS
  Administered 2014-12-12: 25 ug via INTRAVENOUS
  Administered 2014-12-12: 50 ug via INTRAVENOUS
  Administered 2014-12-12: 25 ug via INTRAVENOUS
  Administered 2014-12-12: 150 ug via INTRAVENOUS

## 2014-12-12 MED ORDER — NALOXONE HCL 0.4 MG/ML IJ SOLN: 0.4000 mg | INTRAMUSCULAR | Status: DC | PRN

## 2014-12-12 MED ORDER — LEVOFLOXACIN IN D5W 500 MG/100ML IV SOLN
INTRAVENOUS | Status: AC
  Administered 2014-12-12: 500 mg via INTRAVENOUS
  Filled 2014-12-12: qty 100

## 2014-12-12 MED ORDER — LACTATED RINGERS IV SOLN
INTRAVENOUS | Status: DC | PRN
  Administered 2014-12-12: 07:00:00 via INTRAVENOUS

## 2014-12-12 MED ORDER — SUCCINYLCHOLINE CHLORIDE 20 MG/ML IJ SOLN
INTRAMUSCULAR | Status: DC | PRN
  Administered 2014-12-12: 100 mg via INTRAVENOUS

## 2014-12-12 MED ORDER — METHYLENE BLUE 1 % INJ SOLN
INTRAMUSCULAR | Status: AC
  Filled 2014-12-12: qty 10

## 2014-12-12 MED ORDER — STERILE WATER FOR IRRIGATION IR SOLN
Status: DC | PRN
  Administered 2014-12-12: 1 mL via INTRAVESICAL

## 2014-12-12 MED ORDER — HYDROMORPHONE HCL 1 MG/ML IJ SOLN
INTRAMUSCULAR | Status: AC
  Administered 2014-12-12: 0.25 mg via INTRAVENOUS
  Filled 2014-12-12: qty 1

## 2014-12-12 MED ORDER — ACETAMINOPHEN 10 MG/ML IV SOLN
INTRAVENOUS | Status: AC
  Filled 2014-12-12: qty 100

## 2014-12-12 MED ORDER — LEVOFLOXACIN IN D5W 500 MG/100ML IV SOLN
INTRAVENOUS | Status: DC | PRN
  Administered 2014-12-12: 500 mg via INTRAVENOUS

## 2014-12-12 MED ORDER — SODIUM CHLORIDE 0.9 % IV BOLUS (SEPSIS)
1000.0000 mL | Freq: Once | INTRAVENOUS | Status: AC
  Administered 2014-12-12: 1000 mL via INTRAVENOUS

## 2014-12-12 MED ORDER — DIPHENHYDRAMINE HCL 12.5 MG/5ML PO ELIX: 12.5000 mg | ORAL_SOLUTION | Freq: Four times a day (QID) | ORAL | Status: DC | PRN

## 2014-12-12 MED ORDER — ONDANSETRON HCL 4 MG/2ML IJ SOLN
4.0000 mg | Freq: Four times a day (QID) | INTRAMUSCULAR | Status: DC | PRN
  Administered 2014-12-17 – 2014-12-30 (×3): 4 mg via INTRAVENOUS
  Filled 2014-12-12: qty 2

## 2014-12-12 MED ORDER — GABAPENTIN 100 MG PO CAPS
100.0000 mg | ORAL_CAPSULE | Freq: Every day | ORAL | Status: DC
  Administered 2014-12-12 – 2014-12-30 (×19): 100 mg via ORAL
  Filled 2014-12-12 (×19): qty 1

## 2014-12-12 MED ORDER — ONDANSETRON HCL 4 MG PO TABS: 4.0000 mg | ORAL_TABLET | Freq: Four times a day (QID) | ORAL | Status: DC | PRN

## 2014-12-12 MED ORDER — AMLODIPINE BESYLATE 5 MG PO TABS
2.5000 mg | ORAL_TABLET | Freq: Every day | ORAL | Status: DC
  Administered 2014-12-13 – 2014-12-27 (×13): 2.5 mg via ORAL
  Administered 2014-12-28: 5 mg via ORAL
  Administered 2014-12-29 – 2014-12-31 (×3): 2.5 mg via ORAL
  Filled 2014-12-12 (×17): qty 1

## 2014-12-12 MED ORDER — ENOXAPARIN SODIUM 40 MG/0.4ML ~~LOC~~ SOLN: 40.0000 mg | SUBCUTANEOUS | Status: DC

## 2014-12-12 MED ORDER — SODIUM CHLORIDE 0.9 % IV SOLN
INTRAVENOUS | Status: DC
  Administered 2014-12-12 (×2): via INTRAVENOUS

## 2014-12-12 MED ORDER — SODIUM CHLORIDE 0.9 % IV SOLN
INTRAVENOUS | Status: DC | PRN
  Administered 2014-12-12 (×2): via INTRAVENOUS

## 2014-12-12 MED ORDER — ALVIMOPAN 12 MG PO CAPS
ORAL_CAPSULE | ORAL | Status: AC
  Filled 2014-12-12: qty 1

## 2014-12-12 MED ORDER — MORPHINE SULFATE 1 MG/ML IV SOLN
INTRAVENOUS | Status: DC
  Administered 2014-12-12: 16:00:00 via INTRAVENOUS
  Administered 2014-12-12: 1.5 mg via INTRAVENOUS
  Administered 2014-12-12: 2 mg via INTRAVENOUS
  Administered 2014-12-12: 1.5 mg via INTRAVENOUS
  Administered 2014-12-13: 3 mg via INTRAVENOUS
  Administered 2014-12-13 (×3): 4.5 mg via INTRAVENOUS
  Administered 2014-12-13: 3 mg via INTRAVENOUS
  Administered 2014-12-14: 13.5 mg via INTRAVENOUS
  Administered 2014-12-14: 4.5 mg via INTRAVENOUS
  Administered 2014-12-14: 7.5 mg via INTRAVENOUS
  Administered 2014-12-14: 1.5 mg via INTRAVENOUS
  Administered 2014-12-14: 12 mg via INTRAVENOUS
  Administered 2014-12-14: 1.5 mg via INTRAVENOUS
  Administered 2014-12-14: 13:00:00 via INTRAVENOUS
  Administered 2014-12-15: 1 mg via INTRAVENOUS
  Administered 2014-12-15: 9 mg via INTRAVENOUS
  Administered 2014-12-15: 3 mg via INTRAVENOUS
  Administered 2014-12-15: 0 mg via INTRAVENOUS
  Administered 2014-12-15 (×2): 10.5 mg via INTRAVENOUS
  Administered 2014-12-16 (×2): 4.5 mg via INTRAVENOUS
  Administered 2014-12-16: 3 mg via INTRAVENOUS
  Administered 2014-12-16: 6 mg via INTRAVENOUS
  Administered 2014-12-16: 13:00:00 via INTRAVENOUS
  Administered 2014-12-17: 1.5 mg via INTRAVENOUS
  Administered 2014-12-17 (×2): 9 mg via INTRAVENOUS
  Administered 2014-12-17: 13:00:00 via INTRAVENOUS
  Administered 2014-12-17: 9 mg via INTRAVENOUS
  Filled 2014-12-12 (×9): qty 25

## 2014-12-12 MED ORDER — ROCURONIUM BROMIDE 100 MG/10ML IV SOLN
INTRAVENOUS | Status: DC | PRN
  Administered 2014-12-12 (×3): 10 mg via INTRAVENOUS
  Administered 2014-12-12: 45 mg via INTRAVENOUS
  Administered 2014-12-12: 5 mg via INTRAVENOUS

## 2014-12-12 MED ORDER — OXYCODONE HCL 5 MG/5ML PO SOLN: 5.0000 mg | Freq: Once | ORAL | Status: DC | PRN

## 2014-12-12 MED ORDER — ALVIMOPAN 12 MG PO CAPS
12.0000 mg | ORAL_CAPSULE | Freq: Two times a day (BID) | ORAL | Status: AC
  Administered 2014-12-13 – 2014-12-19 (×14): 12 mg via ORAL
  Filled 2014-12-12 (×14): qty 1

## 2014-12-12 MED ORDER — ENOXAPARIN SODIUM 40 MG/0.4ML ~~LOC~~ SOLN
40.0000 mg | Freq: Once | SUBCUTANEOUS | Status: AC
  Administered 2014-12-12: 40 mg via SUBCUTANEOUS
  Filled 2014-12-12: qty 0.4

## 2014-12-12 MED ORDER — HYDROMORPHONE HCL 1 MG/ML IJ SOLN
0.2500 mg | INTRAMUSCULAR | Status: AC | PRN
  Administered 2014-12-12 (×8): 0.25 mg via INTRAVENOUS

## 2014-12-12 MED ORDER — PROPOFOL 10 MG/ML IV BOLUS
INTRAVENOUS | Status: DC | PRN
  Administered 2014-12-12: 100 mg via INTRAVENOUS

## 2014-12-12 MED ORDER — MICROFIBRILLAR COLL HEMOSTAT EX PADS
MEDICATED_PAD | CUTANEOUS | Status: AC
  Filled 2014-12-12: qty 1

## 2014-12-12 MED ORDER — SODIUM CHLORIDE 0.9 % IV SOLN
10000.0000 ug | INTRAVENOUS | Status: DC | PRN
  Administered 2014-12-12: 10 ug/min via INTRAVENOUS

## 2014-12-12 MED ORDER — MICROFIBRILLAR COLL HEMOSTAT EX POWD
CUTANEOUS | Status: DC | PRN
  Administered 2014-12-12: 1 g via TOPICAL

## 2014-12-12 MED ORDER — SODIUM CHLORIDE 0.9 % IJ SOLN: 9.0000 mL | INTRAMUSCULAR | Status: DC | PRN

## 2014-12-12 MED ORDER — OXYCODONE HCL 5 MG PO TABS: 5.0000 mg | ORAL_TABLET | Freq: Once | ORAL | Status: DC | PRN

## 2014-12-12 MED ORDER — DIPHENHYDRAMINE HCL 50 MG/ML IJ SOLN: 12.5000 mg | Freq: Four times a day (QID) | INTRAMUSCULAR | Status: DC | PRN

## 2014-12-12 MED ORDER — DEXAMETHASONE SODIUM PHOSPHATE 4 MG/ML IJ SOLN
INTRAMUSCULAR | Status: DC | PRN
  Administered 2014-12-12: 10 mg via INTRAVENOUS

## 2014-12-12 SURGICAL SUPPLY — 110 items
ADHESIVE MASTISOL STRL (MISCELLANEOUS) ×4 IMPLANT
BAG BILE T-TUBES STRL (MISCELLANEOUS) ×8 IMPLANT
BAG DRAIN CYSTO-URO LG1000N (MISCELLANEOUS) IMPLANT
BAG URO DRAIN 2000ML W/SPOUT (MISCELLANEOUS) ×8 IMPLANT
BRIEF STRETCH MATERNITY 2XLG (MISCELLANEOUS) ×4 IMPLANT
BULB RESERV EVAC DRAIN JP 100C (MISCELLANEOUS) IMPLANT
CANISTER SUCT 1200ML W/VALVE (MISCELLANEOUS) ×4 IMPLANT
CATH FOL 2WAY LX 16X5 (CATHETERS) ×4 IMPLANT
CATH FOL 2WAY LX 30X30 (CATHETERS) ×4 IMPLANT
CATH FOLEY 2WAY  5CC 16FR (CATHETERS) ×2
CATH TRAY 16F METER LATEX (MISCELLANEOUS) IMPLANT
CATH URETL 5X70 OPEN END (CATHETERS) ×4 IMPLANT
CATH URETL OPEN END 4X70 (CATHETERS) IMPLANT
CATH URETL OPEN END 6X70 (CATHETERS) IMPLANT
CATH URTH 16FR FL 2W BLN LF (CATHETERS) ×2 IMPLANT
CHLORAPREP W/TINT 26ML (MISCELLANEOUS) ×4 IMPLANT
CLIP TI LARGE 6 (CLIP) IMPLANT
CLIP TI MEDIUM 6 (CLIP) IMPLANT
CLOSURE WOUND 1/2 X4 (GAUZE/BANDAGES/DRESSINGS)
CONRAY 43 FOR UROLOGY 50M (MISCELLANEOUS) ×4 IMPLANT
COUNTER NEEDLE 20/40 LG (NEEDLE) ×4 IMPLANT
DRAIN CHANNEL JP 19F (MISCELLANEOUS) IMPLANT
DRAPE C-ARM XRAY 36X54 (DRAPES) ×4 IMPLANT
DRAPE INCISE IOBAN 66X45 STRL (DRAPES) IMPLANT
DRAPE LAPAROTOMY 100X77 ABD (DRAPES) ×4 IMPLANT
DRAPE LEGGINS SURG 28X43 STRL (DRAPES) ×4 IMPLANT
DRAPE SHEET LG 3/4 BI-LAMINATE (DRAPES) ×4 IMPLANT
DRAPE TABLE BACK 80X90 (DRAPES) ×4 IMPLANT
DRAPE UTILITY 15X26 TOWEL STRL (DRAPES) ×8 IMPLANT
DRESSING ALLEVYN LIFE SACRUM (GAUZE/BANDAGES/DRESSINGS) ×4 IMPLANT
DRSG OPSITE POSTOP 4X10 (GAUZE/BANDAGES/DRESSINGS) ×4 IMPLANT
DRSG OPSITE POSTOP 4X8 (GAUZE/BANDAGES/DRESSINGS) IMPLANT
ELECT BLADE 6.5 EXT (BLADE) ×4 IMPLANT
ELECT CAUTERY BLADE 6.4 (BLADE) ×8 IMPLANT
GAUZE PETRO XEROFOAM 1X8 (MISCELLANEOUS) IMPLANT
GLOVE BIO SURGEON STRL SZ 6.5 (GLOVE) ×12 IMPLANT
GLOVE BIO SURGEON STRL SZ7 (GLOVE) ×8 IMPLANT
GLOVE BIO SURGEON STRL SZ7.5 (GLOVE) ×16 IMPLANT
GLOVE BIO SURGEONS STRL SZ 6.5 (GLOVE) ×4
GLOVE INDICATOR 8.0 STRL GRN (GLOVE) ×12 IMPLANT
GOWN STRL REUS W/ TWL LRG LVL3 (GOWN DISPOSABLE) ×16 IMPLANT
GOWN STRL REUS W/ TWL XL LVL3 (GOWN DISPOSABLE) ×4 IMPLANT
GOWN STRL REUS W/TWL LRG LVL3 (GOWN DISPOSABLE) ×16
GOWN STRL REUS W/TWL XL LVL3 (GOWN DISPOSABLE) ×4
GUIDEWIRE GREEN .038 145CM (MISCELLANEOUS) ×4 IMPLANT
HANDLE YANKAUER SUCT BULB TIP (MISCELLANEOUS) ×4 IMPLANT
HEMOSTAT SURGICEL 2X14 (HEMOSTASIS) ×8 IMPLANT
JELLY LUB 2OZ STRL (MISCELLANEOUS) ×2
JELLY LUBE 2OZ STRL (MISCELLANEOUS) ×2 IMPLANT
KIT RM TURNOVER CYSTO AR (KITS) ×4 IMPLANT
LABEL OR SOLS (LABEL) ×4 IMPLANT
LIGASURE 5MM LAPAROSCOPIC (INSTRUMENTS) ×4 IMPLANT
NS IRRIG 1000ML POUR BTL (IV SOLUTION) ×4 IMPLANT
PACK BASIN MAJOR ARMC (MISCELLANEOUS) ×4 IMPLANT
PACK BASIN MINOR ARMC (MISCELLANEOUS) ×4 IMPLANT
PACK COLON CLEAN CLOSURE (MISCELLANEOUS) ×4 IMPLANT
PACK CYSTO AR (MISCELLANEOUS) ×4 IMPLANT
PAD ABD DERMACEA PRESS 5X9 (GAUZE/BANDAGES/DRESSINGS) IMPLANT
PAD GROUND ADULT SPLIT (MISCELLANEOUS) ×4 IMPLANT
PAD PREP 24X41 OB/GYN DISP (PERSONAL CARE ITEMS) ×4 IMPLANT
PENCIL ELECTRO HAND CTR (MISCELLANEOUS) ×4 IMPLANT
PREP PVP WINGED SPONGE (MISCELLANEOUS) ×4 IMPLANT
RETAINER VISCERA MED (MISCELLANEOUS) IMPLANT
SENSORWIRE 0.038 NOT ANGLED (WIRE)
SET CYSTO W/LG BORE CLAMP LF (SET/KITS/TRAYS/PACK) ×4 IMPLANT
SET YANKAUER POOLE SUCT (MISCELLANEOUS) ×4 IMPLANT
SOL .9 NS 3000ML IRR  AL (IV SOLUTION) ×2
SOL .9 NS 3000ML IRR UROMATIC (IV SOLUTION) ×2 IMPLANT
SPONGE LAP 18X18 5 PK (GAUZE/BANDAGES/DRESSINGS) ×12 IMPLANT
STAPLER CUT CVD 40MM BLUE (STAPLE) ×4 IMPLANT
STAPLER PROXIMATE 75MM BLUE (STAPLE) ×4 IMPLANT
STAPLER SKIN PROX 35W (STAPLE) ×8 IMPLANT
STENT URET 6FRX24 CONTOUR (STENTS) IMPLANT
STENT URET 6FRX26 CONTOUR (STENTS) IMPLANT
STRIP CLOSURE SKIN 1/2X4 (GAUZE/BANDAGES/DRESSINGS) IMPLANT
SUT CHROMIC 2 0 SH (SUTURE) ×8 IMPLANT
SUT CHROMIC 3 0 SH 27 (SUTURE) ×12 IMPLANT
SUT CHROMIC BR 1/2CLE 2-0 54IN (SUTURE) IMPLANT
SUT ETHILON 3-0 FS-10 30 BLK (SUTURE) ×8
SUT MAXON ABS #0 GS21 30IN (SUTURE) IMPLANT
SUT NYLON 2-0 (SUTURE) IMPLANT
SUT PDS AB 0 CT1 27 (SUTURE) IMPLANT
SUT PDS AB 1 TP1 96 (SUTURE) ×8 IMPLANT
SUT SILK 0 SH 30 (SUTURE) ×4 IMPLANT
SUT SILK 2 0 (SUTURE) ×2
SUT SILK 2-0 30XBRD TIE 12 (SUTURE) ×2 IMPLANT
SUT SILK 3-0 (SUTURE) ×4 IMPLANT
SUT SILK 3-0 SH-1 18XCR BRD (SUTURE)
SUT SILK 4 0 (SUTURE)
SUT SILK 4-0 18XBRD TIE 12 (SUTURE) IMPLANT
SUT VIC AB 2-0 CT1 27 (SUTURE) ×2
SUT VIC AB 2-0 CT1 TAPERPNT 27 (SUTURE) ×2 IMPLANT
SUT VIC AB 2-0 CT2 27 (SUTURE) ×16 IMPLANT
SUT VIC AB 3-0 54X BRD REEL (SUTURE) ×2 IMPLANT
SUT VIC AB 3-0 BRD 54 (SUTURE) ×2
SUT VIC AB 3-0 SH 27 (SUTURE) ×18
SUT VIC AB 3-0 SH 27X BRD (SUTURE) ×18 IMPLANT
SUT VICRYL PLUS ABS 0 54 (SUTURE) ×12 IMPLANT
SUTURE EHLN 3-0 FS-10 30 BLK (SUTURE) ×4 IMPLANT
SUTURE SILK 3-0 SH-1 18XCR BRD (SUTURE) IMPLANT
SYR 20CC LL (SYRINGE) ×4 IMPLANT
SYR 30ML LL (SYRINGE) ×4 IMPLANT
SYR INFLATION 60ML (SYRINGE) ×4 IMPLANT
SYRINGE IRR TOOMEY STRL 70CC (SYRINGE) IMPLANT
TOWEL OR 17X26 4PK STRL BLUE (TOWEL DISPOSABLE) ×4 IMPLANT
TUBING ART PRESS 48 MALE/FEM (TUBING) ×8 IMPLANT
TUBING CONNECTING 10 (TUBING) ×3 IMPLANT
TUBING CONNECTING 10' (TUBING) ×1
WATER STERILE IRR 1000ML POUR (IV SOLUTION) ×4 IMPLANT
WIRE SENSOR 0.038 NOT ANGLED (WIRE) IMPLANT

## 2014-12-12 NOTE — Anesthesia Preprocedure Evaluation (Addendum)
Anesthesia Evaluation  Patient identified by MRN, date of birth, ID band Patient awake    Reviewed: Allergy & Precautions, H&P , NPO status , Patient's Chart, lab work & pertinent test results, reviewed documented beta blocker date and time   Airway Mallampati: II  TM Distance: >3 FB Neck ROM: limited    Dental  (+) Chipped   Pulmonary neg pulmonary ROS,  breath sounds clear to auscultation  Pulmonary exam normal       Cardiovascular Exercise Tolerance: Poor hypertension, + Peripheral Vascular Disease - Past MI Normal cardiovascular examRhythm:regular Rate:Normal     Neuro/Psych  Neuromuscular disease negative psych ROS   GI/Hepatic Neg liver ROS, GERD-  Controlled,  Endo/Other  diabetes, Well Controlled, Type 2  Renal/GU Renal InsufficiencyRenal disease  negative genitourinary   Musculoskeletal  (+) Arthritis -,   Abdominal   Peds  Hematology negative hematology ROS (+)   Anesthesia Other Findings Past Medical History:   Hypertension                                                 Peripheral vascular disease                                  Hyperlipidemia                                               Diarrhea                                                     Diabetes mellitus without complication                       BPH (benign prostatic hyperplasia)                           Prostate cancer                                              Arthritis                                                    Knee pain, bilateral                                         Rectal cancer                                                Chronic kidney disease  Comment:incontience   IBS (irritable bowel syndrome)                               Neuropathy due to secondary diabetes mellitus                Hx of pulmonary embolus                                        Comment:2011--treated with  Coumadin   Reproductive/Obstetrics negative OB ROS                            Anesthesia Physical Anesthesia Plan  ASA: III  Anesthesia Plan: General   Post-op Pain Management:    Induction:   Airway Management Planned:   Additional Equipment:   Intra-op Plan:   Post-operative Plan:   Informed Consent: I have reviewed the patients History and Physical, chart, labs and discussed the procedure including the risks, benefits and alternatives for the proposed anesthesia with the patient or authorized representative who has indicated his/her understanding and acceptance.   Dental Advisory Given  Plan Discussed with: Anesthesiologist, CRNA and Surgeon  Anesthesia Plan Comments:         Anesthesia Quick Evaluation

## 2014-12-12 NOTE — Op Note (Signed)
Date of procedure: 12/12/2014  Preoperative diagnosis:  1. Rectal adenocarcinoma  Postoperative diagnosis:  1. Rectal adenocarcinoma 2. Right distal ureterectasis  Procedure: 1. Cystoscopy 2. Bilateral retrograde pyelogram 3. Left ureteral stent placement 4. Right ureteral wire placement  Surgeon: Hollice Espy, MD  Anesthesia: General  Complications: None  Intraoperative findings: Right retrograde pyelogram shows approximately 3 cm area of bulb like dilation of the distal right ureter just proximal to the iliacs.  Wire passed easily beyond this area but unable to pass 5 [place] catheter.  Left retrograde and stent placement uncomplicated.  EBL: Minimal  Specimens: None  Drains: 16 French Foley catheter, right Super Stiff ureteral wire, left 5 [place] open-ended ureteral catheter secured to Foley  Indication: Darryl Hooper. is a 81 y.o. patient with T3 N0 distal rectal adenocarcinoma undergoing APR by Dr. Felton Clinton. I was asked to place preoperative ureteral stents to help with ureteral identification.  After reviewing the management options for treatment, he elected to proceed with the above surgical procedure(s). We have discussed the potential benefits and risks of the procedure, side effects of the proposed treatment, the likelihood of the patient achieving the goals of the procedure, and any potential problems that might occur during the procedure or recuperation. Informed consent has been obtained.  Description of procedure:  The patient was taken to the operating room and general anesthesia was induced.  The patient was placed in the dorsal lithotomy position, prepped and draped in the usual sterile fashion, and preoperative antibiotics were administered. A preoperative time-out was performed.   At this point in time, a rigid 42 French cystoscope was advanced per urethra into the bladder. Of note the patient did have an enlarged prostate and is somewhat trabeculated bladder.  There were no obvious bladder lesions noted.  Attention was then turned to the right ureteral orifice which was cannulated using a sensor wire which was able to pass up to the kidney without difficulty under fluoroscopic guidance.  I then attempted to advance a 5 [place] open-ended ureteral catheter over the wire, however, met resistance at the level just beyond the iliacs and the 5 [place] did not past easily. The wire was then withdrawn and a retrograde pyelogram was performed. This revealed a saccular like dilation of the ureter in this area without any obvious filling defects, approximately 3 cm in length. Contrast did pass both proximal and distal to this area up to the kidney which was not hydronephrotic. It was no obvious intraluminal mass in this area on fluoroscopy or any obvious filling defect. Is unclear whether 5 [place] would not advance. I then attempted several other catheters including a more rigid 6 [place] as well as a 5 [place] double-J contour stent, each of which did not pass beyond this ureteral abnormality. Eventually exchange the wire for a superstiff wire which passed easily which is left in place to help with identification of the right ureter.  She was then turned to left ureteral orifice which was cannulated using a 5 [place] open-ended ureteral catheter. At this point in time a retrograde pyelogram was performed and this revealed an absolutely normal ureter without extravasation, filling defects, or any dilation. The proximal collecting system was also normal. [place] open-ended ureteral catheter was then advanced to the level of the renal pelvis under fluoroscopic guidance and the wire was removed.  Finally, the scope was removed and a 22 French Foley catheter was advanced per urethra into the bladder. The balloon was inflated with 10 cc of  sterile water. Both the Super Stiff wire and a 5 [place] open-ended ureteral catheter were secured to the Foley catheter using silk ties. The patient  was then repositioned per the primary team for the remainder of the procedure. Intraoperative findings were discussed with Dr. Felton Clinton.  Plan:    1) Recommend removal of the Super Stiff wire on the right side at the end of the case 2) Plan remove left ureteral stent tomorrow for staged removal 3) Foley catheter may be DC'd thereafter by the primary team    Hollice Espy, M.D.

## 2014-12-12 NOTE — Interval H&P Note (Signed)
Plan for preop ureteral stents to help with ureteral identification.  Discussed with patient this AM.  Aware of risks.    Hollice Espy, MD    History and Physical Interval Note:  12/12/2014 7:27 AM  Darryl Hooper.  has presented today for surgery, with the diagnosis of VASCULAR DISORDER OF LOWER EXTREMITY  The various methods of treatment have been discussed with the patient and family. After consideration of risks, benefits and other options for treatment, the patient has consented to  Procedure(s): ABDOMINAL PERINEAL RESECTION (N/A) CYSTOSCOPY WITH STENT PLACEMENT (Bilateral) as a surgical intervention .  The patient's history has been reviewed, patient examined, no change in status, stable for surgery.  I have reviewed the patient's chart and labs.  Questions were answered to the patient's satisfaction.     Hollice Espy

## 2014-12-12 NOTE — H&P (Signed)
Date of Initial H&P: 11/19/2014  History reviewed, patient examined, no change in status, stable for surgery.  Again reviewed procedure and all questions addressed.  Plan Abdominoperineal reection Ostomy site marked.

## 2014-12-12 NOTE — Transfer of Care (Signed)
Immediate Anesthesia Transfer of Care Note  Patient: Darryl Hooper.  Procedure(s) Performed: Procedure(s): ABDOMINAL PERINEAL RESECTION (N/A) cystoscopy, bilateral retrogrades, left ureteral stent placement, attempted right stent placement. (Bilateral)  Patient Location: PACU  Anesthesia Type:General  Level of Consciousness: awake, alert  and oriented  Airway & Oxygen Therapy: Patient Spontanous Breathing and Patient connected to face mask oxygen  Post-op Assessment: Report given to RN and Post -op Vital signs reviewed and stable  Post vital signs: Reviewed and stable  Last Vitals:  Filed Vitals:   12/12/14 0628  BP: 116/73  Pulse: 61  Temp: 36.5 C  Resp: 16    Complications: No apparent anesthesia complications

## 2014-12-12 NOTE — Op Note (Signed)
12/12/2014  12:41 PM  PATIENT:  Darryl Hooper.  81 y.o. male  PRE-OPERATIVE DIAGNOSIS:  Rectal cancer  POST-OPERATIVE DIAGNOSIS:  Rectal cancer  PROCEDURE:  Procedure(s): ABDOMINAL PERINEAL RESECTION (N/A) cystoscopy, bilateral retrogrades, left ureteral stent placement, attempted right stent placement. (Bilateral) Partial colon resection  SURGEON:  Surgeon(s) and Role:     * [name], MD - Primary  Assistant: Bronson Ing, MD FACS   ANESTHESIA: general  SPECIMEN: rectum and portion of sigmoid colon  EBL: 200 cc  2100 IV  200 CC UOP.  Description of procedure:    With informed consent supine position general endotracheal anesthesia was induced patient was then padded and positioned dorsal lithotomy. Dr. Erlene Quan performed cystoscopy. Ureteral stenting was performed.  The perineum was sterilely prepped and draped with Betadine the abdomen with chloroprep solution and a time out was observed.  Skin incision was fashion from just above the umbilicus to the pubic symphysis with scalpel and carried through muscular fascial layers with electrocautery. Self-retaining abdominal retractor was placed.  The distal sigmoid colon was divided with a fire of the contour stapler. The intervening mesentery and sigmoidal branches were divided with LigaSure apparatus and ties of number oh Vicryls suture. Total mesorectal excision was performed in the standard fashion by obtaining entrance is presacral space utilizing electrocautery and the lateral stalks being divided with LigaSure apparatus. Anteriorly this bladder was adherent to the anterior portion of the rectum and a 2 mm rent was fashioned in the dissection anteriorly with electrocautery which was later repaired with 3-0 vicryl suture.  I then moved to the perineal dissection. A pursestring suture of #0 silk was placed to close the anal canal. An elliptical incision was fashion with electrocautery and scalpel. Dissection was then undertaken.  The anterior aspect of the coccyx was identified. I then entered the pelvisc space from below dividing further lateral stalks using LigaSure apparatus. The rectum and sigmoid colon was then delivered posteriorly through the perineal incision. Anteriorly the tumor was densely adherent to the prostatic capsule which was involved in a significant post-radiation scar. The anterior rectum unfortunately was opened at this time. I then obtained a negative margin grossly by incising the prostatic capsule and taking a portion of the prostatic capsule with the specimen.  The wound was then irrigated and hemostasis was obtained with point cautery LigaSure apparatus as well as figure-of-eight #3-0 silk suture. A 30 French Foley catheter with 30 cc balloon was introduced through the perineal wound and the wound was then closed in multiple layers of 30 in 20 Vicryls suture. Skin edges were reapproximated utilizing skin staples and 30 and 4-0 nylon suture. The balloon was then pulled down into the pelvis and occluded with 30 cc of saline.  The ostomy site was excised in elliptical fashion. Cruciate incisions were fashioned in the anterior and posterior fascial layers with muscle splitting technique of the rectus. Hemostasis was obtained with point cautery. Sigmoid colon was redundant in this area and required an excision of approximately 8 cm of colon with a fire of the GIA-75 stapler with blue application in the intervening mesentery divided between clamps and ties of number 000 Vicryl suture.  The ostomy was then brought up through the ostomy orifice without any undue tension and secured at multiple points with 3-0 silk suture both anteriorly and posteriorly.  The abdominal midline fascia was then reapproximated utilizing running looped #1 PDS suture from the extremes of the wound and tied in the middle. Skin edges  were reapproximated utilizing a skin stapler.  A standard Brooke colostomy was fashioned utilizing 3-0 chromic  suture.  Ostomy appliance was placed.  The patient was then returned supine extubated and taken to the recovery room in stable and satisfactory condition by anesthesia services.

## 2014-12-12 NOTE — Anesthesia Postprocedure Evaluation (Signed)
  Anesthesia Post-op Note  Patient: Darryl Hooper.  Procedure(s) Performed: Procedure(s): ABDOMINAL PERINEAL RESECTION (N/A) cystoscopy, bilateral retrogrades, left ureteral stent placement, attempted right stent placement. (Bilateral)  Anesthesia type:General  Patient location: PACU  Post pain: Pain level controlled  Post assessment: Post-op Vital signs reviewed, Patient's Cardiovascular Status Stable, Respiratory Function Stable, Patent Airway and No signs of Nausea or vomiting  Post vital signs: Reviewed and stable  Last Vitals:  Filed Vitals:   12/12/14 1346  BP: 112/70  Pulse: 66  Temp: 36.6 C  Resp: 13    Level of consciousness: awake, alert  and patient cooperative  Complications: No apparent anesthesia complications

## 2014-12-13 ENCOUNTER — Encounter: Payer: Self-pay | Admitting: Surgery

## 2014-12-13 ENCOUNTER — Inpatient Hospital Stay: Payer: Medicare Other

## 2014-12-13 DIAGNOSIS — R34 Anuria and oliguria: Secondary | ICD-10-CM

## 2014-12-13 DIAGNOSIS — R509 Fever, unspecified: Secondary | ICD-10-CM

## 2014-12-13 LAB — GLUCOSE, CAPILLARY
GLUCOSE-CAPILLARY: 209 mg/dL — AB (ref 65–99)
Glucose-Capillary: 140 mg/dL — ABNORMAL HIGH (ref 65–99)
Glucose-Capillary: 133 mg/dL — ABNORMAL HIGH (ref 65–99)
Glucose-Capillary: 173 mg/dL — ABNORMAL HIGH (ref 65–99)

## 2014-12-13 LAB — BASIC METABOLIC PANEL
Anion gap: 10 (ref 5–15)
BUN: 35 mg/dL — AB (ref 6–20)
CHLORIDE: 105 mmol/L (ref 101–111)
CO2: 18 mmol/L — AB (ref 22–32)
CREATININE: 2.66 mg/dL — AB (ref 0.61–1.24)
Calcium: 7.5 mg/dL — ABNORMAL LOW (ref 8.9–10.3)
GFR calc non Af Amer: 21 mL/min — ABNORMAL LOW (ref >60)
GFR, EST AFRICAN AMERICAN: 24 mL/min — AB (ref >60)
Glucose, Bld: 272 mg/dL — ABNORMAL HIGH (ref 65–99)
POTASSIUM: 5 mmol/L (ref 3.5–5.1)
SODIUM: 133 mmol/L — AB (ref 135–145)

## 2014-12-13 LAB — CREATININE, FLUID (PLEURAL, PERITONEAL, JP DRAINAGE): Creat, Fluid: 2.5 mg/dL

## 2014-12-13 LAB — CBC
HCT: 29.2 % — ABNORMAL LOW (ref 40.0–52.0)
Hemoglobin: 9.7 g/dL — ABNORMAL LOW (ref 13.0–18.0)
MCH: 30.3 pg (ref 26.0–34.0)
MCHC: 33.3 g/dL (ref 32.0–36.0)
MCV: 90.9 fL (ref 80.0–100.0)
Platelets: 170 10*3/uL (ref 150–440)
RBC: 3.21 MIL/uL — AB (ref 4.40–5.90)
RDW: 13.6 % (ref 11.5–14.5)
WBC: 17.8 10*3/uL — ABNORMAL HIGH (ref 3.8–10.6)

## 2014-12-13 LAB — SURGICAL PATHOLOGY

## 2014-12-13 MED ORDER — SODIUM CHLORIDE 0.9 % IV SOLN
INTRAVENOUS | Status: DC
  Administered 2014-12-13 – 2014-12-17 (×12): via INTRAVENOUS
  Administered 2014-12-17: 1000 mL via INTRAVENOUS
  Administered 2014-12-17 – 2014-12-19 (×4): via INTRAVENOUS

## 2014-12-13 MED ORDER — SODIUM CHLORIDE 0.9 % IV BOLUS (SEPSIS)
1000.0000 mL | Freq: Once | INTRAVENOUS | Status: AC
  Administered 2014-12-13: 1000 mL via INTRAVENOUS

## 2014-12-13 NOTE — Progress Notes (Signed)
Notified MD, dr Tammi Klippel of pt low urine output. Continue to monitor output and am lab results.

## 2014-12-13 NOTE — Progress Notes (Signed)
Inpatient Diabetes Program Recommendations  AACE/ADA: New Consensus Statement on Inpatient Glycemic Control (2013)  Target Ranges:  Prepandial:   less than 140 mg/dL      Peak postprandial:   less than 180 mg/dL (1-2 hours)      Critically ill patients:  140 - 180 mg/dL   Results for Darryl Hooper, Darryl Hooper (MRN [id number]) as of 12/13/2014 12:48  Ref. Range 12/12/2014 06:39 12/12/2014 13:08 12/12/2014 16:41 12/12/2014 21:18 12/13/2014 07:58 12/13/2014 11:17  Glucose-Capillary Latest Ref Range: 65-99 mg/dL 137 (H) 220 (H) 224 (H) 276 (H) 209 (H) 140 (H)    Diabetes history: DM2 Outpatient Diabetes medications: Amaryl 4 mg daily Current orders for Inpatient glycemic control: Novolog 0-15 units TID with meals  Inpatient Diabetes Program Recommendations Correction (SSI): Please consider ordeirng Novolog bedtime correction scale.  Thanks, Barnie Alderman, RN, MSN, CCRN, CDE Diabetes Coordinator Inpatient Diabetes Program [phone] (Team Pager from [place] to [place]) [phone] ([facility] office) [phone] [facility] office) [phone] [facility] office)

## 2014-12-13 NOTE — Progress Notes (Signed)
Urology Consult Follow Up  Subjective: POD 1 s/p APR with attempted preop bilateral stent placement with evidence of some degree of right ureteral obstruction (unable to advance stent) of unclear etiology and left stent placement.  Left stent removed at end of case inadvertently.  Low UOP overnight despite irrigation and fluid boluses.  Cr 2.6 this AM.  Foley being flushed intermittently without clots.  Patient denies flank pain.  Anti-infectives: Anti-infectives    Start     Dose/Rate Route Frequency Ordered Stop   12/12/14 0715  levofloxacin (LEVAQUIN) IVPB 500 mg     500 mg 100 mL/hr over 60 Minutes Intravenous  Once 12/12/14 0713 12/12/14 0829   12/12/14 0600  ertapenem (INVANZ) 1 g in sodium chloride 0.9 % 50 mL IVPB  Status:  Discontinued     1 g 100 mL/hr over 30 Minutes Intravenous On call to O.R. 12/11/14 2304 12/12/14 1507      Current Facility-Administered Medications  Medication Dose Route Frequency Provider Last Rate Last Dose  . 0.9 %  sodium chloride infusion   Intravenous Continuous Marlyce Huge, MD      . alvimopan (ENTEREG) capsule 12 mg  12 mg Oral BID [name], MD      . amLODipine (NORVASC) tablet 2.5 mg  2.5 mg Oral Daily [name], MD   2.5 mg at 12/12/14 1515  . aspirin EC tablet 81 mg  81 mg Oral Daily [name], MD   81 mg at 12/12/14 1515  . diphenhydrAMINE (BENADRYL) injection 12.5 mg  12.5 mg Intravenous Q6H PRN [name], MD      . gabapentin (NEURONTIN) capsule 100 mg  100 mg Oral QHS [name], MD   100 mg at 12/12/14 2223  . insulin aspart (novoLOG) injection 0-15 Units  0-15 Units Subcutaneous TID WC [name], MD   5 Units at 12/12/14 1749  . losartan (COZAAR) tablet 100 mg  100 mg Oral Daily [name], MD   100 mg at 12/12/14 1515  . morphine 1 MG/ML PCA injection   Intravenous 6 times per day [name], MD   Stopped at 12/12/14 1605  . naloxone [facility]) injection 0.4 mg  0.4 mg Intravenous PRN [name], MD       And  . sodium chloride 0.9  % injection 9 mL  9 mL Intravenous PRN [name], MD      . ondansetron [facility]) tablet 4 mg  4 mg Oral Q6H PRN [name], MD       Or  . ondansetron [facility]) injection 4 mg  4 mg Intravenous Q6H PRN [name], MD      . sodium chloride 0.9 % bolus 1,000 mL  1,000 mL Intravenous Once Marlyce Huge, MD      . sodium chloride 0.9 % injection 3 mL  3 mL Intravenous Q12H [name], MD   3 mL at 12/12/14 2223     Objective: Vital signs in last 24 hours: Temp:  [97.3 F (36.3 C)-98.6 F (37 C)] 98.6 F (37 C) (07/15 0555) Pulse Rate:  [47-72] 66 (07/15 0555) Resp:  [13-19] 17 (07/15 0755) BP: (97-120)/(50-70) 112/55 mmHg (07/15 0555) SpO2:  [94 %-100 %] 97 % (07/15 0755) FiO2 (%):  [65 %-72 %] 72 % (07/15 0400) Weight:  [233 lb 11 oz (106 kg)-237 lb 4.8 oz (107.639 kg)] 237 lb 4.8 oz (107.639 kg) (07/15 0103)  Intake/Output from previous day: 07/14 0701 - 07/15 0700 In: 4758.5 [I.V.:3919.5; IV Piggyback:619] Out:  885 [Urine:385; Stool:150; Blood:350] Intake/Output this shift: Total I/O In: 317 [I.V.:317] Out: -    Physical Exam  Constitutional: He is well-developed, well-nourished, and in no distress.  HENT:  Head: Normocephalic and atraumatic.  Neck: Normal range of motion. Neck supple.  Pulmonary/Chest: Effort normal.  Abdominal: Soft.  Honeycomb dressing in place.  Ostomy present without output.   Rectal tube in place draining cranberry  Colored fluid.    Genitourinary: Penis normal.  Foley in place draining scan cranberry colored urine.  No CVA tenderness bilaterally.    Skin: Skin is warm and dry.  Psychiatric: Affect normal.    Lab Results:   Recent Labs  12/13/14 0427  WBC 17.8*  HGB 9.7*  HCT 29.2*  PLT 170   BMET  Recent Labs  12/13/14 0427  NA 133*  K 5.0  CL 105  CO2 18*  GLUCOSE 272*  BUN 35*  CREATININE 2.66*  CALCIUM 7.5*   Studies/Results: Dg C-arm 1-60 Min-no Report  12/12/2014   CLINICAL DATA: stent placement   C-ARM 1-60  MINUTES  Fluoroscopy was utilized by the requesting physician.  No radiographic  interpretation.      Assessment: s/p Procedure(s): ABDOMINAL PERINEAL RESECTION cystoscopy, bilateral retrogrades, left ureteral stent placement, attempted right stent placement.  Oliguria with rising Cr 2.6.  Suspect possible partial bilateral ureteral obstruction for edema/ bilateral ureteral manipulation but may be multifactorial.    Plan: 1) RUS this AM to assess for hydronephrosis   2) Drain Cr from RECTAL drain to r/o urethral injury  3) Continue to trend UOP/ Cr  4) If fails to improve, may need intervention  Patient discussed with Dr. Rexene Edison who is caring this patient today.    LOS: 1 day    Hollice Espy 12/13/2014

## 2014-12-13 NOTE — Progress Notes (Signed)
Surgery progress note  S: Low UOP overnight, Cr up this am O: AF/VSS, 200 uop since 7 pm GEN: NAD/A&Ox3 ABD: soft, mild tender, nondistended, bloody catheters  A/P 81 yo s/p APR with increased Cr - IVF - follow up UOP - pain control

## 2014-12-13 NOTE — Progress Notes (Signed)
S:  Called by RN about "low urine output". Approx 3L in / 300 out per EMR. Pt had APR and peri-op ureteral stents. He has no JP drain. His stents are out. There is mention of taking some prostatic capsule duirng APR resection. His SBP's are 100-110s but non tachycardic. Has foley that irrigates quantitatively per RN. Baseline Cr <1.5.  A/P 1 -AM labs coming up in few hours, continue current MIVF, if sig rise in Cr then rec CT abd-pelvis without and then second run with bladder contrast (essentially CT cystogram) to r/o major bladder leak, new hydro (obstruction), or large abd fluid collection (urinoma). Will discuss with Dr. Erlene Quan in AM.

## 2014-12-14 DIAGNOSIS — R934 Abnormal findings on diagnostic imaging of urinary organs: Secondary | ICD-10-CM

## 2014-12-14 DIAGNOSIS — N289 Disorder of kidney and ureter, unspecified: Secondary | ICD-10-CM | POA: Diagnosis not present

## 2014-12-14 DIAGNOSIS — N179 Acute kidney failure, unspecified: Secondary | ICD-10-CM

## 2014-12-14 DIAGNOSIS — C61 Malignant neoplasm of prostate: Secondary | ICD-10-CM

## 2014-12-14 LAB — CBC
HCT: 25.3 % — ABNORMAL LOW (ref 40.0–52.0)
HEMOGLOBIN: 8.2 g/dL — AB (ref 13.0–18.0)
MCH: 29.6 pg (ref 26.0–34.0)
MCHC: 32.5 g/dL (ref 32.0–36.0)
MCV: 91.1 fL (ref 80.0–100.0)
Platelets: 131 10*3/uL — ABNORMAL LOW (ref 150–440)
RBC: 2.77 MIL/uL — AB (ref 4.40–5.90)
RDW: 13.5 % (ref 11.5–14.5)
WBC: 14.5 10*3/uL — ABNORMAL HIGH (ref 3.8–10.6)

## 2014-12-14 LAB — BASIC METABOLIC PANEL
ANION GAP: 8 (ref 5–15)
BUN: 31 mg/dL — ABNORMAL HIGH (ref 6–20)
CO2: 18 mmol/L — AB (ref 22–32)
Calcium: 7.3 mg/dL — ABNORMAL LOW (ref 8.9–10.3)
Chloride: 106 mmol/L (ref 101–111)
Creatinine, Ser: 2.63 mg/dL — ABNORMAL HIGH (ref 0.61–1.24)
GFR calc Af Amer: 24 mL/min — ABNORMAL LOW (ref >60)
GFR, EST NON AFRICAN AMERICAN: 21 mL/min — AB (ref >60)
Glucose, Bld: 158 mg/dL — ABNORMAL HIGH (ref 65–99)
Potassium: 4.3 mmol/L (ref 3.5–5.1)
Sodium: 132 mmol/L — ABNORMAL LOW (ref 135–145)

## 2014-12-14 LAB — GLUCOSE, CAPILLARY
GLUCOSE-CAPILLARY: 156 mg/dL — AB (ref 65–99)
GLUCOSE-CAPILLARY: 155 mg/dL — AB (ref 65–99)
GLUCOSE-CAPILLARY: 133 mg/dL — AB (ref 65–99)
Glucose-Capillary: 174 mg/dL — ABNORMAL HIGH (ref 65–99)

## 2014-12-14 NOTE — Progress Notes (Signed)
Surgery Progress Note  S: No issues.  UOP improved, Cr stable O: Blood pressure 141/64, pulse 71, temperature 98.3 F (36.8 C), temperature source Oral, resp. rate 16, height 6\' 2"  (1.88 m), weight 111.766 kg (246 lb 6.4 oz), SpO2 96 %. GEN: NAD/A&ox3 ABD: soft, min tender, nondistended  Cr 2.63  A/P 81 yo s/p APR, doing well. - IS - resuscitation - OOB to chair

## 2014-12-14 NOTE — Progress Notes (Signed)
Patient ID: Darryl Hooper., male   DOB: 09/01/1929, 81 y.o.   MRN: [id number]    @EN[place]ATE @ 7:36 AM   Darryl Hooper. 29-Oct-1929 [id number]  HPI:  1 - Rt Ureteral Abnormality - Rt ureteral distal abnormality c/w possible stricture noted by Dr. Erlene Quan intra-op durign stent placement 11/2014. All sents out post-op.   2 - Acute Renal Failure - Low UOP and acuter Cr rise noted POD 1 after APR and ureteral stent placement. Renal US POD 1 w/o sig hydro. Cr from rectal drain (connects to peritoneal cavity) 2.4 and same as serum ruling out sig intraperitoneal urine leak.  Improving POD 2 with >1L UOP in 24 hrs.  3 - Prostate Cancer - s/p brachytherapy previously for unknown grade / stage disease. Recent imaging w/o locally advanced or distant disease.   Today "Darryl Hooper" is w/o complaints. Tollerating clears, pain controlled. UOP picking up well with hydration.    PMH: Past Medical History  Diagnosis Date  . Hypertension   . Peripheral vascular disease   . Hyperlipidemia   . Diarrhea   . Diabetes mellitus without complication   . BPH (benign prostatic hyperplasia)   . Prostate cancer   . Arthritis   . Knee pain, bilateral   . Rectal cancer   . Chronic kidney disease     incontience  . IBS (irritable bowel syndrome)   . Neuropathy due to secondary diabetes mellitus   . Hx of pulmonary embolus     2011--treated with Coumadin    Surgical History: Past Surgical History  Procedure Laterality Date  . Prostatectomy    . Joint replacement      left knee  . Colonoscopy N/A 10/23/2014    Procedure: COLONOSCOPY;  Surgeon: Lucilla Lame, MD;  Location: [place];  Service: Gastroenterology;  Laterality: N/A;  . Knee arthroscopy w/ meniscal repair Left 2012  . Eus N/A 11/14/2014    Procedure: LOWER ENDOSCOPIC ULTRASOUND (EUS);  Surgeon: Holly Bodily, MD;  Location: [facility] ENDOSCOPY;  Service: Endoscopy;  Laterality: N/A;  . Eye surgery Bilateral     cataract extraction  .  Tonsillectomy    . Abdominal perineal bowel resection N/A 12/12/2014    Procedure: ABDOMINAL PERINEAL RESECTION;  Surgeon: [name], MD;  Location: [facility] ORS;  Service: General;  Laterality: N/A;  . Cystoscopy with stent placement Bilateral 12/12/2014    Procedure: cystoscopy, bilateral retrogrades, left ureteral stent placement, attempted right stent placement.;  Surgeon: Hollice Espy, MD;  Location: [facility] ORS;  Service: Urology;  Laterality: Bilateral;    Home Medications:    Medication List    ASK your doctor about these medications        acetaminophen 500 MG tablet  Commonly known as:  TYLENOL  Take 1,000 mg by mouth every 6 (six) hours as needed for moderate pain.     amLODipine 2.5 MG tablet  Commonly known as:  NORVASC  Take 1 tablet by mouth daily.     bisacodyl 5 MG EC tablet  Commonly known as:  bisacodyl  Take 4 tablets at 8am, the day before your surgery     cholecalciferol 1000 UNITS tablet  Commonly known as:  VITAMIN D  Take 2,000 Units by mouth daily.     gabapentin 100 MG capsule  Commonly known as:  NEURONTIN  Take 1 capsule (100 mg total) by mouth at bedtime.     glimepiride 4 MG tablet  Commonly known as:  AMARYL  Take 4 mg  by mouth daily.     losartan 100 MG tablet  Commonly known as:  COZAAR  Take 100 mg by mouth daily.     polyethylene glycol powder powder  Commonly known as:  GLYCOLAX/MIRALAX  Mix miralax with 64 oz. Of gatorade and drink one 8 oz. Glass every 15-20 minutes until gone (for surgery prep)        Allergies: No Known Allergies  Family History: Family History  Problem Relation Age of Onset  . Heart disease Mother   . Heart disease Father   . Cancer Father 2    Colon  . Multiple sclerosis Son     Social History:  reports that he has never smoked. He has never used smokeless tobacco. He reports that he does not drink alcohol or use illicit drugs.   Review of Systems     Physical Exam: BP 124/54 mmHg  Pulse 74   Temp(Src) 99.9 F (37.7 C) (Oral)  Resp 14  Ht 6\' 2"  (1.88 m)  Wt 246 lb 6.4 oz (111.766 kg)  BMI 31.62 kg/m2  SpO2 94%  Constitutional:  Alert and oriented, No acute distress. HEENT: [place] AT, moist mucus membranes.  Trachea midline, no masses. Cardiovascular: No clubbing, cyanosis, or edema. Respiratory: Normal respiratory effort, no increased work of breathing. GI: Abdomen is soft, nontender, nondistended, no abdominal masses. Scant colosomty output, colostomy pink.  GU: No CVA tenderness. Foley c/d/i with clearing urine, still with some gross blood, no clots.  Skin: No rashes, bruises or suspicious lesions. Lymph: No cervical or inguinal adenopathy. Neurologic: Grossly intact, no focal deficits, moving all 4 extremities. Psychiatric: Normal mood and affect.  Laboratory Data: Lab Results  Component Value Date   WBC 17.8* 12/13/2014   HGB 9.7* 12/13/2014   HCT 29.2* 12/13/2014   MCV 90.9 12/13/2014   PLT 170 12/13/2014    Lab Results  Component Value Date   CREATININE 2.66* 12/13/2014    Urinalysis No results found for: COLORURINE, APPEARANCEUR, LABSPEC, PHURINE, GLUCOSEU, HGBUR, BILIRUBINUR, KETONESUR, PROTEINUR, UROBILINOGEN, NITRITE, LEUKOCYTESUR  Assessment & Plan:    1 - Rt Ureteral Abnormality - no rt hydro by [place] yesterday, observe.   2 - Acute Renal Failure - UOP improving. Likely multifactorial with some pre-renal and mild post renal (ureteral edema) etiology. [place] and drain Cr favorable, do not recommend additional tubes / drain. Serum Cr today pending, but do not recommend additional intervention at this point as this tents to be somewhat of a lagging indicator.  3 - Prostate Cancer - under satisfactory control clinically.   Will follow.   Alexis Frock, [facility] [address] [place], [place] [address] [phone]

## 2014-12-15 DIAGNOSIS — N179 Acute kidney failure, unspecified: Secondary | ICD-10-CM

## 2014-12-15 DIAGNOSIS — C61 Malignant neoplasm of prostate: Secondary | ICD-10-CM

## 2014-12-15 DIAGNOSIS — R934 Abnormal findings on diagnostic imaging of urinary organs: Secondary | ICD-10-CM

## 2014-12-15 DIAGNOSIS — N289 Disorder of kidney and ureter, unspecified: Secondary | ICD-10-CM

## 2014-12-15 LAB — BASIC METABOLIC PANEL
Anion gap: 8 (ref 5–15)
BUN: 30 mg/dL — ABNORMAL HIGH (ref 6–20)
CALCIUM: 7.1 mg/dL — AB (ref 8.9–10.3)
CO2: 17 mmol/L — ABNORMAL LOW (ref 22–32)
CREATININE: 2.33 mg/dL — AB (ref 0.61–1.24)
Chloride: 105 mmol/L (ref 101–111)
GFR, EST AFRICAN AMERICAN: 28 mL/min — AB (ref >60)
GFR, EST NON AFRICAN AMERICAN: 24 mL/min — AB (ref >60)
Glucose, Bld: 202 mg/dL — ABNORMAL HIGH (ref 65–99)
Potassium: 3.9 mmol/L (ref 3.5–5.1)
Sodium: 130 mmol/L — ABNORMAL LOW (ref 135–145)

## 2014-12-15 LAB — GLUCOSE, CAPILLARY
GLUCOSE-CAPILLARY: 149 mg/dL — AB (ref 65–99)
GLUCOSE-CAPILLARY: 142 mg/dL — AB (ref 65–99)
Glucose-Capillary: 174 mg/dL — ABNORMAL HIGH (ref 65–99)
Glucose-Capillary: 148 mg/dL — ABNORMAL HIGH (ref 65–99)

## 2014-12-15 NOTE — Progress Notes (Signed)
Surgery Progress Note  S: Pain controlled, on liquids, urine clearing up O:  Blood pressure 135/61, pulse 78, temperature 97.8 F (36.6 C), temperature source Oral, resp. rate 14, height 6\' 2"  (1.88 m), weight 113.717 kg (250 lb 11.2 oz), SpO2 95 %. GEN: NAD/A&Ox3 ABD: incision c/d/i, min tender, ostomy pink PERINEUM: incision c/d/i with staples  A/P 81 yo s/p APR, doing well - liquids for now - ambulate - f/u bmp

## 2014-12-15 NOTE — Progress Notes (Signed)
Patient ID: Darryl Lodge., male   DOB: [birth year], 81 y.o.   MRN: [id number]     12/15/2014 12:35  Darryl Anctil. December 25, 1929 [id number]  HPI:  1 - Rt Ureteral Abnormality - Rt ureteral distal abnormality c/w possible stricture noted by Dr. Erlene Quan intra-op durign stent placement 11/2014. All sents out post-op.   2 - Acute Renal Failure - Low UOP and acuter Cr rise noted POD 1 after APR and ureteral stent placement. Renal US POD 1 w/o sig hydro. Cr from rectal drain (connects to peritoneal cavity) 2.4 and same as serum ruling out sig intraperitoneal urine leak.  Improving now with increased UOP and drop in serum Cr.  3 - Prostate Cancer - s/p brachytherapy previously for unknown grade / stage disease. Recent imaging w/o locally advanced or distant disease.   Today "Darryl Hooper" is w/o complaints. Tollerating clears still. Minimal ostomy output. NO flank pain.    PMH: Past Medical History  Diagnosis Date  . Hypertension   . Peripheral vascular disease   . Hyperlipidemia   . Diarrhea   . Diabetes mellitus without complication   . BPH (benign prostatic hyperplasia)   . Prostate cancer   . Arthritis   . Knee pain, bilateral   . Rectal cancer   . Chronic kidney disease     incontience  . IBS (irritable bowel syndrome)   . Neuropathy due to secondary diabetes mellitus   . Hx of pulmonary embolus     2011--treated with Coumadin    Surgical History: Past Surgical History  Procedure Laterality Date  . Prostatectomy    . Joint replacement      left knee  . Colonoscopy N/A 10/23/2014    Procedure: COLONOSCOPY;  Surgeon: Lucilla Lame, MD;  Location: [place];  Service: Gastroenterology;  Laterality: N/A;  . Knee arthroscopy w/ meniscal repair Left 2012  . Eus N/A 11/14/2014    Procedure: LOWER ENDOSCOPIC ULTRASOUND (EUS);  Surgeon: Holly Bodily, MD;  Location: [facility] ENDOSCOPY;  Service: Endoscopy;  Laterality: N/A;  . Eye surgery Bilateral     cataract extraction  .  Tonsillectomy    . Abdominal perineal bowel resection N/A 12/12/2014    Procedure: ABDOMINAL PERINEAL RESECTION;  Surgeon: [name], MD;  Location: [facility] ORS;  Service: General;  Laterality: N/A;  . Cystoscopy with stent placement Bilateral 12/12/2014    Procedure: cystoscopy, bilateral retrogrades, left ureteral stent placement, attempted right stent placement.;  Surgeon: Hollice Espy, MD;  Location: [facility]S;  Service: Urology;  Laterality: Bilateral;    Home Medications:    Medication List    ASK your doctor about these medications        acetaminophen 500 MG tablet  Commonly known as:  TYLENOL  Take 1,000 mg by mouth every 6 (six) hours as needed for moderate pain.     amLODipine 2.5 MG tablet  Commonly known as:  NORVASC  Take 1 tablet by mouth daily.     bisacodyl 5 MG EC tablet  Commonly known as:  bisacodyl  Take 4 tablets at 8am, the day before your surgery     cholecalciferol 1000 UNITS tablet  Commonly known as:  VITAMIN D  Take 2,000 Units by mouth daily.     gabapentin 100 MG capsule  Commonly known as:  NEURONTIN  Take 1 capsule (100 mg total) by mouth at bedtime.     glimepiride 4 MG tablet  Commonly known as:  AMARYL  Take 4 mg by  mouth daily.     losartan 100 MG tablet  Commonly known as:  COZAAR  Take 100 mg by mouth daily.     polyethylene glycol powder powder  Commonly known as:  GLYCOLAX/MIRALAX  Mix miralax with 64 oz. Of gatorade and drink one 8 oz. Glass every 15-20 minutes until gone (for surgery prep)        Allergies: No Known Allergies  Social History:  reports that he has never smoked. He has never used smokeless tobacco. He reports that he does not drink alcohol or use illicit drugs.   Review of Systems Some abd bloating, no emesis    Physical Exam: BP 124/54 mmHg  Pulse 74  Temp(Src) 99.9 F (37.7 C) (Oral)  Resp 14  Ht 6\' 2"  (1.88 m)  Wt 246 lb 6.4 oz (111.766 kg)  BMI 31.62 kg/m2  SpO2 94%  Constitutional:  Alert and  oriented, No acute distress. HEENT: Darryl Hooper AT, moist mucus membranes.  Trachea midline, no masses. Cardiovascular: No clubbing, cyanosis, or edema. Respiratory: Normal respiratory effort, no increased work of breathing. GI: Abdomen is soft, nontender, nondistended, no abdominal masses. Scant colosomty output, colostomy pink.  GU: No CVA tenderness. Foley c/d/i with clearing urine, still clear yellow urine.  Skin: No rashes, bruises or suspicious lesions. Lymph: No cervical or inguinal adenopathy. Neurologic: Grossly intact, no focal deficits, moving all 4 extremities. Psychiatric: Normal mood and affect.  Laboratory Data: Cr 2.33   Assessment & Plan:    1 - Rt Ureteral Abnormality - no rt hydro by [place] POD1, observe.   2 - Acute Renal Failure - UOP improving. Likely multifactorial with some pre-renal and mild post renal (ureteral edema) etiology. [place] and drain Cr favorable and improving, do not recommend additional tubes / drain.   3 - Prostate Cancer - under satisfactory control clinically.   Will follow.

## 2014-12-16 LAB — GLUCOSE, CAPILLARY
GLUCOSE-CAPILLARY: 158 mg/dL — AB (ref 65–99)
GLUCOSE-CAPILLARY: 137 mg/dL — AB (ref 65–99)
Glucose-Capillary: 170 mg/dL — ABNORMAL HIGH (ref 65–99)
Glucose-Capillary: 131 mg/dL — ABNORMAL HIGH (ref 65–99)

## 2014-12-16 LAB — CEA

## 2014-12-16 MED ORDER — ACETAMINOPHEN 325 MG PO TABS
650.0000 mg | ORAL_TABLET | Freq: Four times a day (QID) | ORAL | Status: DC | PRN
  Administered 2014-12-16 – 2014-12-17 (×2): 650 mg via ORAL
  Filled 2014-12-16 (×2): qty 2

## 2014-12-16 NOTE — Care Management (Signed)
Spoke with patient and son Darnell Level who is at the bedside. Patient is from home and reports that he was driving himself up until this surgery. Patient lives with spouse and she drives as well. Has Cane and walker at home. PCP is Dr Luan Pulling Rocco Serene. No difficulty with obtaining medications. Stated that he was told by MD that he would need SNF/ STR at discharge. Needs PT evaluation and Colostomy wound care nurse orders. Informed nursing staff.

## 2014-12-16 NOTE — Evaluation (Signed)
Physical Therapy Evaluation Patient Details Name: Darryl Hooper. MRN: [id number] DOB: 06/30/1929 Today's Date: 12/16/2014   History of Present Illness  Pt is s/p APR surgery for rectal CA. At baseline he is fully independent with a single point cane. Pt reports 3-4 falls over the last 12 months. Per RN, pt was +3 earlier to transfer from bed to recliner.   Clinical Impression  Pt demonstrates severe weakness and deconditioning during evaluation. He requires maxA+2 for bed mobility and transfers and is unable to ambulate at this time. Pt will require SNF placement at discharge in order to return to prior level of function at home. Pt will benefit from skilled PT services to address deficits in strength, balance, and mobility in order to return to full function at home.     Follow Up Recommendations SNF    Equipment Recommendations  None recommended by PT (To be assessed at next location)    Recommendations for Other Services       Precautions / Restrictions Precautions Precautions: Fall Restrictions Weight Bearing Restrictions: No      Mobility  Bed Mobility Overal bed mobility: Needs Assistance;+2 for physical assistance Bed Mobility: Supine to Sit;Sit to Supine     Supine to sit: Max assist;+2 for physical assistance Sit to supine: Max assist;+2 for safety/equipment   General bed mobility comments: Poor sequencing and strength. Poor trunk control initially upon sitting but improves over time  Transfers Overall transfer level: Needs assistance Equipment used: Rolling walker (2 wheeled) Transfers: Sit to/from Stand Sit to Stand: Max assist;+2 physical assistance         General transfer comment: Cues for hand placement, sequencing, and weight transfer. Pt with poor anterior weight shifting and continual LOB posterior once upright in standing. Pt locks knee due to decreased quad strength. Poor LE power and increased time required to perform transfer  Ambulation/Gait              General Gait Details: Unable to attempt at this time due to balance, fatigue, and weakness. Pt unsafe to ambulate  Stairs            Wheelchair Mobility    Modified Rankin (Stroke Patients Only)       Balance Overall balance assessment: Needs assistance   Sitting balance-Leahy Scale: Fair       Standing balance-Leahy Scale: Poor                               Pertinent Vitals/Pain Pain Assessment: 0-10 Pain Score: 5  Pain Location: "Butt" Pain Intervention(s): Limited activity within patient's tolerance;Monitored during session;PCA encouraged    Home Living Family/patient expects to be discharged to:: Private residence Living Arrangements: Spouse/significant other Available Help at Discharge: Family Type of Home: House Home Access: Stairs to enter Entrance Stairs-Rails: Left Entrance Stairs-Number of Steps: 4 steps Home Layout: One level Home Equipment: [place] - 2 wheels;Cane - single point;Bedside commode (Three point cane)      Prior Function Level of Independence: Independent with assistive device(s)         Comments: spc     Hand Dominance        Extremity/Trunk Assessment   Upper Extremity Assessment: Generalized weakness (Less thant 3/5)           Lower Extremity Assessment: Generalized weakness (At least 3+/5. Limited due to pain)         Communication   Communication: No difficulties  Cognition Arousal/Alertness: Lethargic Behavior During Therapy: WFL for tasks assessed/performed Overall Cognitive Status: Within Functional Limits for tasks assessed                      General Comments      Exercises Total Joint Exercises Ankle Circles/Pumps: Both;5 reps;Supine (Pt educated about bed exercises) Quad Sets: Strengthening;Both;Supine;5 reps Gluteal Sets: Strengthening;Both;5 reps;Supine Hip ABduction/ADduction: Strengthening;Both;5 reps;Supine Straight Leg Raises: Strengthening;Both;5  reps;Supine      Assessment/Plan    PT Assessment Patient needs continued PT services  PT Diagnosis Difficulty walking;Abnormality of gait;Generalized weakness;Acute pain   PT Problem List Decreased strength;Decreased activity tolerance;Decreased balance;Decreased mobility;Decreased knowledge of use of DME;Decreased knowledge of precautions;Pain  PT Treatment Interventions DME instruction;Gait training;Stair training;Functional mobility training;Therapeutic activities;Balance training;Neuromuscular re-education;Therapeutic exercise   PT Goals (Current goals can be found in the Care Plan section) Acute Rehab PT Goals Patient Stated Goal: "I'm just so tired." Pt would like to return to prior level of function PT Goal Formulation: With patient Time For Goal Achievement: 12/30/14 Potential to Achieve Goals: Good    Frequency Min 2X/week   Barriers to discharge Inaccessible home environment 4 stairs to enter    Co-evaluation               End of Session Equipment Utilized During Treatment: Gait belt;Oxygen Activity Tolerance: Patient limited by fatigue;Patient limited by lethargy;Patient limited by pain Patient left: in bed;with bed alarm set;with call bell/phone within reach;with family/visitor present Nurse Communication: Other (comment) (IV alarm sounding)         Time:  -      Charges:   PT Evaluation $Initial PT Evaluation Tier I: 1 Procedure PT Treatments $Therapeutic Activity: 8-22 mins   PT G Codes:       Lyndel Safe Deagen Krass PT, DPT   Kierstyn Baranowski 12/16/2014, 4:20 PM

## 2014-12-16 NOTE — Care Management Important Message (Signed)
Important Message  Patient Details  Name: Darryl Hooper. MRN: [id number] Date of Birth: 03/26/1930   Medicare Important Message Given:  Yes-second notification given    [name] 12/16/2014, 1:55 PM

## 2014-12-16 NOTE — Progress Notes (Signed)
4 Days Post-Op  Subjective: Status post APR. Patient doing very well but has not passed any material into his colostomy at. Multiple drains are present. Discussed with Dr. Marina Gravel the management of the drains.  Objective: Vital signs in last 24 hours: Temp:  [97.9 F (36.6 C)-99.4 F (37.4 C)] 99.1 F (37.3 C) (07/18 0757) Pulse Rate:  [71-81] 72 (07/18 0757) Resp:  [10-21] 17 (07/18 0757) BP: (119-156)/(54-69) 133/54 mmHg (07/18 0757) SpO2:  [95 %-99 %] 96 % (07/18 0757) Weight:  [254 lb 11.2 oz (115.531 kg)] 254 lb 11.2 oz (115.531 kg) (07/18 0617) Last BM Date: 12/14/14  Intake/Output from previous day: 07/17 0701 - 07/18 0700 In: 2577 [I.V.:2577] Out: 2745 [MVEHM:0947; Stool:300] Intake/Output this shift: Total I/O In: 283.8 [I.V.:283.8] Out: 100 [Urine:100]  Physical exam:  Soft nontender abdomen wound is clean and dressed ostomy is not functional but pink. Multiple drains present.  Lab Results: CBC   Recent Labs  12/14/14 0738  WBC 14.5*  HGB 8.2*  HCT 25.3*  PLT 131*   BMET  Recent Labs  12/14/14 0738 12/15/14 0912  NA 132* 130*  K 4.3 3.9  CL 106 105  CO2 18* 17*  GLUCOSE 158* 202*  BUN 31* 30*  CREATININE 2.63* 2.33*  CALCIUM 7.3* 7.1*   PT/INR No results for input(s): LABPROT, INR in the last 72 hours. ABG No results for input(s): PHART, HCO3 in the last 72 hours.  Invalid input(s): PCO2, PO2  Studies/Results: No results found.  Anti-infectives: Anti-infectives    Start     Dose/Rate Route Frequency Ordered Stop   12/12/14 0715  levofloxacin (LEVAQUIN) IVPB 500 mg     500 mg 100 mL/hr over 60 Minutes Intravenous  Once 12/12/14 0713 12/12/14 0829   12/12/14 0600  ertapenem (INVANZ) 1 g in sodium chloride 0.9 % 50 mL IVPB  Status:  Discontinued     1 g 100 mL/hr over 30 Minutes Intravenous On call to O.R. 12/11/14 2304 12/12/14 1507      Assessment/Plan: s/p Procedure(s): ABDOMINAL PERINEAL RESECTION cystoscopy, bilateral  retrogrades, left ureteral stent placement, attempted right stent placement.   Patient doing very well at this point recommend continue drains as is discussed with Dr. Marina Gravel. Will advance diet to clears wants his ostomy output has progressed.  Florene Glen, MD, FACS  12/16/2014

## 2014-12-17 DIAGNOSIS — R34 Anuria and oliguria: Secondary | ICD-10-CM | POA: Insufficient documentation

## 2014-12-17 LAB — COMPREHENSIVE METABOLIC PANEL
ALK PHOS: 49 U/L (ref 38–126)
ALT: 22 U/L (ref 17–63)
AST: 29 U/L (ref 15–41)
Albumin: 2.3 g/dL — ABNORMAL LOW (ref 3.5–5.0)
Anion gap: 6 (ref 5–15)
BUN: 26 mg/dL — AB (ref 6–20)
CO2: 17 mmol/L — ABNORMAL LOW (ref 22–32)
CREATININE: 1.84 mg/dL — AB (ref 0.61–1.24)
Calcium: 7.3 mg/dL — ABNORMAL LOW (ref 8.9–10.3)
Chloride: 110 mmol/L (ref 101–111)
GFR calc Af Amer: 37 mL/min — ABNORMAL LOW (ref >60)
GFR, EST NON AFRICAN AMERICAN: 32 mL/min — AB (ref >60)
GLUCOSE: 136 mg/dL — AB (ref 65–99)
Potassium: 3.9 mmol/L (ref 3.5–5.1)
Sodium: 133 mmol/L — ABNORMAL LOW (ref 135–145)
Total Bilirubin: 0.7 mg/dL (ref 0.3–1.2)
Total Protein: 4.8 g/dL — ABNORMAL LOW (ref 6.5–8.1)

## 2014-12-17 LAB — CBC WITH DIFFERENTIAL/PLATELET
BASOS ABS: 0 10*3/uL (ref 0–0.1)
Basophils Relative: 0 %
EOS PCT: 1 %
Eosinophils Absolute: 0.1 10*3/uL (ref 0–0.7)
HCT: 23.9 % — ABNORMAL LOW (ref 40.0–52.0)
Hemoglobin: 7.8 g/dL — ABNORMAL LOW (ref 13.0–18.0)
Lymphocytes Relative: 3 %
Lymphs Abs: 0.4 10*3/uL — ABNORMAL LOW (ref 1.0–3.6)
MCH: 30 pg (ref 26.0–34.0)
MCHC: 32.8 g/dL (ref 32.0–36.0)
MCV: 91.7 fL (ref 80.0–100.0)
MONO ABS: 1 10*3/uL (ref 0.2–1.0)
Monocytes Relative: 7 %
NEUTROS ABS: 12.1 10*3/uL — AB (ref 1.4–6.5)
Neutrophils Relative %: 89 %
PLATELETS: 169 10*3/uL (ref 150–440)
RBC: 2.61 MIL/uL — AB (ref 4.40–5.90)
RDW: 13.7 % (ref 11.5–14.5)
WBC: 13.7 10*3/uL — ABNORMAL HIGH (ref 3.8–10.6)

## 2014-12-17 LAB — GLUCOSE, CAPILLARY
GLUCOSE-CAPILLARY: 149 mg/dL — AB (ref 65–99)
GLUCOSE-CAPILLARY: 148 mg/dL — AB (ref 65–99)
GLUCOSE-CAPILLARY: 142 mg/dL — AB (ref 65–99)
GLUCOSE-CAPILLARY: 154 mg/dL — AB (ref 65–99)
Glucose-Capillary: 172 mg/dL — ABNORMAL HIGH (ref 65–99)

## 2014-12-17 MED ORDER — MORPHINE SULFATE 1 MG/ML IV SOLN
INTRAVENOUS | Status: DC
  Administered 2014-12-17: 7.5 mg via INTRAVENOUS
  Administered 2014-12-17 (×2): 1.5 mg via INTRAVENOUS
  Administered 2014-12-17: 15 mg via INTRAVENOUS
  Administered 2014-12-18: 1 mg via INTRAVENOUS
  Administered 2014-12-18: 0 mL via INTRAVENOUS
  Administered 2014-12-18 (×2): 1 mg via INTRAVENOUS
  Filled 2014-12-17: qty 25

## 2014-12-17 MED ORDER — PIPERACILLIN-TAZOBACTAM 3.375 G IVPB
3.3750 g | Freq: Three times a day (TID) | INTRAVENOUS | Status: DC
  Administered 2014-12-17 – 2014-12-30 (×39): 3.375 g via INTRAVENOUS
  Filled 2014-12-17 (×43): qty 50

## 2014-12-17 MED ORDER — NALOXONE HCL 0.4 MG/ML IJ SOLN: 0.4000 mg | INTRAMUSCULAR | Status: DC | PRN

## 2014-12-17 MED ORDER — ENOXAPARIN SODIUM 30 MG/0.3ML ~~LOC~~ SOLN
30.0000 mg | SUBCUTANEOUS | Status: DC
  Administered 2014-12-17: 30 mg via SUBCUTANEOUS
  Filled 2014-12-17: qty 0.3

## 2014-12-17 MED ORDER — DIPHENHYDRAMINE HCL 50 MG/ML IJ SOLN: 12.5000 mg | Freq: Four times a day (QID) | INTRAMUSCULAR | Status: DC | PRN

## 2014-12-17 MED ORDER — DIPHENHYDRAMINE HCL 12.5 MG/5ML PO ELIX: 12.5000 mg | ORAL_SOLUTION | Freq: Four times a day (QID) | ORAL | Status: DC | PRN

## 2014-12-17 MED ORDER — SODIUM CHLORIDE 0.9 % IJ SOLN: 9.0000 mL | INTRAMUSCULAR | Status: DC | PRN

## 2014-12-17 MED ORDER — ONDANSETRON HCL 4 MG/2ML IJ SOLN: 4.0000 mg | Freq: Four times a day (QID) | INTRAMUSCULAR | Status: DC | PRN

## 2014-12-17 NOTE — Progress Notes (Signed)
ANTIBIOTIC CONSULT NOTE - INITIAL  Pharmacy Consult for Zosyn Indication: cellulitis/post op fever  No Known Allergies  Patient Measurements: Height: 6\' 2"  (188 cm) Weight: 251 lb 3.2 oz (113.944 kg) IBW/kg (Calculated) : 82.2 Adjusted Body Weight:   Vital Signs: Temp: 98 F (36.7 C) (07/19 1148) Temp Source: Oral (07/19 1148) BP: 154/62 mmHg (07/19 1148) Pulse Rate: 75 (07/19 1148) Intake/Output from previous day: 07/18 0701 - 07/19 0700 In: 3453.9 [P.O.:480; I.V.:2973.9] Out: 2135 [Urine:1825; Stool:310] Intake/Output from this shift: Total I/O In: 480 [P.O.:480] Out: 450 [Urine:450]  Labs:  Recent Labs  12/15/14 0912 12/17/14 0425  WBC  --  13.7*  HGB  --  7.8*  PLT  --  169  CREATININE 2.33* 1.84*   Estimated Creatinine Clearance: 40.8 mL/min (by C-G formula based on Cr of 1.84). No results for input(s): VANCOTROUGH, VANCOPEAK, VANCORANDOM, GENTTROUGH, GENTPEAK, GENTRANDOM, TOBRATROUGH, TOBRAPEAK, TOBRARND, AMIKACINPEAK, AMIKACINTROU, AMIKACIN in the last 72 hours.   Microbiology: No results found for this or any previous visit (from the past 720 hour(s)).  Medical History: Past Medical History  Diagnosis Date  . Hypertension   . Peripheral vascular disease   . Hyperlipidemia   . Diarrhea   . Diabetes mellitus without complication   . BPH (benign prostatic hyperplasia)   . Prostate cancer   . Arthritis   . Knee pain, bilateral   . Rectal cancer   . Chronic kidney disease     incontience  . IBS (irritable bowel syndrome)   . Neuropathy due to secondary diabetes mellitus   . Hx of pulmonary embolus     2011--treated with Coumadin    Medications:  Scheduled:  . alvimopan  12 mg Oral BID  . amLODipine  2.5 mg Oral Daily  . aspirin EC  81 mg Oral Daily  . enoxaparin (LOVENOX) injection  30 mg Subcutaneous Q24H  . gabapentin  100 mg Oral QHS  . insulin aspart  0-15 Units Subcutaneous TID WC  . losartan  100 mg Oral Daily  . morphine    Intravenous 6 times per day  . sodium chloride  3 mL Intravenous Q12H   Assessment: Patient is a 81 year old man post op ostomy with possible earl cellulitis and fever  Goal of Therapy:  Resolution of infection  Plan:  pip/tazo 3.375mg  q 8hr ei dosing  Sibbie Flammia D Latica Hohmann 12/17/2014,1:58 PM

## 2014-12-17 NOTE — Care Management Note (Signed)
Case Management Note  Patient Details  Name: Darryl Hooper. MRN: [id number] Date of Birth: 10/15/1929  Subjective/Objective:  Rectal tube, Morphine PCA, , telemetry, clear diet.                   Action/Plan: SNF at discharge.   Expected Discharge Date:                  Expected Discharge Plan:     In-House Referral:     Discharge planning Services     Post Acute Care Choice:    Choice offered to:     DME Arranged:    DME Agency:     HH Arranged:    [place] Agency:     Status of Service:     Medicare Important Message Given:  Yes-second notification given Date Medicare IM Given:    Medicare IM give by:    Date Additional Medicare IM Given:    Additional Medicare Important Message give by:     If discussed at [place] of Stay Meetings, dates discussed:    Additional Comments:  Jolly Mango, RN 12/17/2014, 3:05 PM

## 2014-12-17 NOTE — Progress Notes (Signed)
Initial Nutrition Assessment       INTERVENTION:   Meals and snacks: await diet progression per MD order Nutrition supplement therapy: If unable to progress diet within next 24-48 hr recommend adding boost breeze TID for added nutrition  NUTRITION DIAGNOSIS:   Inadequate oral intake related to altered GI function as evidenced by  (NPO/CL day 5).    GOAL:   Patient will meet greater than or equal to 90% of their needs    MONITOR:    (Energy intake, digestive system)  REASON FOR ASSESSMENT:   NPO/Clear Liquid Diet    ASSESSMENT:   Pt s/p parital colon resection, colostomy  Past Medical History  Diagnosis Date  . Hypertension   . Peripheral vascular disease   . Hyperlipidemia   . Diarrhea   . Diabetes mellitus without complication   . BPH (benign prostatic hyperplasia)   . Prostate cancer   . Arthritis   . Knee pain, bilateral   . Rectal cancer   . Chronic kidney disease     incontience  . IBS (irritable bowel syndrome)   . Neuropathy due to secondary diabetes mellitus   . Hx of pulmonary embolus     2011--treated with Coumadin    Current Nutrition: clear liquid tray at bedside, pt sleeping.  Per I and O sheet intake 75-100% of meals  Food/Nutrition-Related History: unsure intake prior to admission   Medications: NS at 166ml/hr, aspart  Electrolyte/Renal Profile and Glucose Profile:   Recent Labs Lab 12/14/14 0738 12/15/14 0912 12/17/14 0425  NA 132* 130* 133*  K 4.3 3.9 3.9  CL 106 105 110  CO2 18* 17* 17*  BUN 31* 30* 26*  CREATININE 2.63* 2.33* 1.84*  CALCIUM 7.3* 7.1* 7.3*  GLUCOSE 158* 202* 136*   Protein Profile:  Recent Labs Lab 12/17/14 0425  ALBUMIN 2.3*    Gastrointestinal Profile: Rectal tube pouch 333ml output last 24 hr    Nutrition-Focused Physical Exam Findings:  Unable to complete Nutrition-Focused physical exam at this time.     Weight Change: no weight loss per wt encounters    Diet Order:  Diet clear  liquid Room service appropriate?: Yes; Fluid consistency:: Thin  Skin:   reviewed     Height:   Ht Readings from Last 1 Encounters:  12/12/14 6\' 2"  (1.88 m)    Weight:   Wt Readings from Last 1 Encounters:  12/17/14 251 lb 3.2 oz (113.944 kg)         Wt Readings from Last 10 Encounters:  12/17/14 251 lb 3.2 oz (113.944 kg)  12/03/14 220 lb (99.791 kg)  11/19/14 222 lb 3.2 oz (100.789 kg)  11/15/14 221 lb 0.2 oz (100.25 kg)  11/08/14 223 lb 6.4 oz (101.334 kg)  11/08/14 222 lb 7.1 oz (100.9 kg)  11/05/14 224 lb (101.606 kg)  10/23/14 212 lb (96.163 kg)    BMI:  Body mass index is 32.24 kg/(m^2).  Estimated Nutritional Needs:   Kcal:  Using IBW of 86 kg BEE 1624 kcals (IF 1.0-1.2, AF 1.2) 3893-7342 kcals/d.   Protein:  Using IBW of 86kg (1.2-1.5 g/kg) 103-129 g/d  Fluid:  Using IBW of 86kg (25-17ml/kg) 2150-253ml/d  EDUCATION NEEDS:   No education needs identified at this time  MODERATE Care Level Darryl Hooper B. Darryl Hooper, [place], [place] (pager)

## 2014-12-17 NOTE — Consult Note (Signed)
WOC ostomy consult note Stoma type/location: LLQ Colostomy.  No output since surgery.  First pouch change today with daughter at bedside.  (Wife is at home with shingles) Stomal assessment/size: 1 1/2" slightly oval, with os pointing downward.  Pink and moist.  Slightly budded.  Appears viable.  Peristomal assessment: Intact with deep creasing 4 cm above stoma.  Will use a barrier ring to "caulk" in this defect.  Treatment options for stomal/peristomal skin: Barrier ring Output Slight bloody liquid in pouch, no stool output.  Ostomy pouching: 2pc. 2 1/4" pouch with barrier ring  Education provided: Daughter at bedside.  Pouch change performed. Discussed emptying when 1/3 full and changing pouch twice weekly.  Demonstrated rationale for barrier ring due to deep creasing.  Education will be ongoing.  Written materials left at bedside.    Enrolled patient in [place] program: No Will await bowel function to assess supply needs.  [place] team will remain available to patient, family, medical and nursing teams.  Domenic Moras RN BSN [place] Pager [phone]

## 2014-12-17 NOTE — Progress Notes (Signed)
Urology Consult Follow Up  Subjective: [name] is an 81 year old white male who underwent  left ureteral stent placement and attempted right ureteral stent placement by Dr. Erlene Hooper on 12/12/2014.  He was found to have a right distal abnormality with a possible ureteral stricture during the intra-op stent placement.  All stents were removed post-op.  He had an increase in his Cr POD 1.  RUS POD 1 w/o significant hydro.  Intraperitoneal urine leak was ruled out.    His Cr (1.84) continues to improve, as well as his UOP.  Today, the patient complains of extreme fatigue.  He is afebrile.  UOP >1L.  Urine is clear and yellow.   Anti-infectives: Anti-infectives    Start     Dose/Rate Route Frequency Ordered Stop   12/12/14 0715  levofloxacin (LEVAQUIN) IVPB 500 mg     500 mg 100 mL/hr over 60 Minutes Intravenous  Once 12/12/14 0713 12/12/14 0829   12/12/14 0600  ertapenem (INVANZ) 1 g in sodium chloride 0.9 % 50 mL IVPB  Status:  Discontinued     1 g 100 mL/hr over 30 Minutes Intravenous On call to O.R. 12/11/14 2304 12/12/14 1507      Current Facility-Administered Medications  Medication Dose Route Frequency Provider Last Rate Last Dose  . 0.9 %  sodium chloride infusion   Intravenous Continuous Darryl Huge, MD 125 mL/hr at 12/16/14 2359    . acetaminophen (TYLENOL) tablet 650 mg  650 mg Oral Q6H PRN Darryl Glen, MD   650 mg at 12/16/14 2121  . alvimopan (ENTEREG) capsule 12 mg  12 mg Oral BID Darryl Rad, MD   12 mg at 12/16/14 2116  . amLODipine (NORVASC) tablet 2.5 mg  2.5 mg Oral Daily Darryl Rad, MD   2.5 mg at 12/16/14 1010  . aspirin EC tablet 81 mg  81 mg Oral Daily Darryl Rad, MD   81 mg at 12/16/14 1010  . diphenhydrAMINE (BENADRYL) injection 12.5 mg  12.5 mg Intravenous Q6H PRN Darryl Rad, MD      . gabapentin (NEURONTIN) capsule 100 mg  100 mg Oral QHS Darryl Rad, MD   100 mg at 12/16/14 2116  . insulin aspart (novoLOG) injection 0-15 Units  0-15 Units Subcutaneous  TID WC Darryl Rad, MD   3 Units at 12/16/14 1217  . losartan (COZAAR) tablet 100 mg  100 mg Oral Daily Darryl Rad, MD   100 mg at 12/16/14 1010  . morphine 1 MG/ML PCA injection   Intravenous 6 times per day Darryl Rad, MD   9 mg at 12/17/14 0002  . naloxone [facility]) injection 0.4 mg  0.4 mg Intravenous PRN Darryl Rad, MD       And  . sodium chloride 0.9 % injection 9 mL  9 mL Intravenous PRN Darryl Rad, MD      . ondansetron [facility]) tablet 4 mg  4 mg Oral Q6H PRN Darryl Rad, MD       Or  . ondansetron [facility]) injection 4 mg  4 mg Intravenous Q6H PRN Darryl Rad, MD      . sodium chloride 0.9 % injection 3 mL  3 mL Intravenous Q12H Darryl Rad, MD   3 mL at 12/16/14 2116     Objective: Vital signs in last 24 hours: Temp:  [98.2 F (36.8 C)-101.7 F (38.7 C)] 98.2 F (36.8 C) (07/19 0559) Pulse Rate:  [69-77] 71 (07/19 0559) Resp:  [12-19] 16 (07/19 0559) BP: (133-156)/(53-65) 141/61 mmHg (  07/19 0559) SpO2:  [22 %-100 %] 99 % (07/19 0559) Weight:  [251 lb 3.2 oz (113.944 kg)] 251 lb 3.2 oz (113.944 kg) (07/19 0521)  Intake/Output from previous day: 07/18 0701 - 07/19 0700 In: 3453.9 [P.O.:480; I.V.:2973.9] Out: 2135 [Urine:1825; Stool:310] Intake/Output this shift:     Physical Exam Constitutional: Alert and oriented. Well appearing and in no acute distress. Cardiovascular: Normal rate, regular rhythm. Grossly normal heart sounds. Respiratory: Normal respiratory effort. No retractions. Lungs CTAB. Genitourinary: Foley in place draining clear yellow urine Musculoskeletal: No lower extremity tenderness nor edema. No joint effusions. Neurologic: Normal speech and language. No gross focal neurologic deficits are appreciated. Speech is normal. Skin: Skin is warm, dry and intact. No rash noted. Psychiatric: Mood and affect are normal. Speech and behavior are normal.  Lab Results:   Recent Labs  12/14/14 0738 12/17/14 0425  WBC 14.5* 13.7*  HGB 8.2* 7.8*  HCT 25.3* 23.9*  PLT  131* 169   BMET  Recent Labs  12/15/14 0912 12/17/14 0425  NA 130* 133*  K 3.9 3.9  CL 105 110  CO2 17* 17*  GLUCOSE 202* 136*  BUN 30* 26*  CREATININE 2.33* 1.84*  CALCIUM 7.1* 7.3*   PT/INR No results for input(s): LABPROT, INR in the last 72 hours. ABG No results for input(s): PHART, HCO3 in the last 72 hours.  Invalid input(s): PCO2, PO2  Studies/Results: No results found.   Assessment: s/p Procedure(s): ABDOMINAL PERINEAL RESECTION cystoscopy, bilateral retrogrades, left ureteral stent placement, attempted right stent placement.  1 - Rt Ureteral Abnormality - Rt ureteral distal abnormality c/w possible stricture noted by Dr. Erlene Hooper intra-op during stent placement 11/2014. All stents out post-op. No hydro on RUS.  Continue to observe.   2 - Acute Renal Failure -  Continuing to improve with today's Cr 1.84 down from 2.33 yesterday  3 - Prostate Cancer - clinically controlled.    Plan: Will follow  CC:     LOS: 5 days    Darryl Hooper 12/17/2014    Case discussed Darryl Hooper.  Chart reviewed.  Agree with above. Darryl Espy, MD

## 2014-12-17 NOTE — Clinical Social Work Note (Signed)
Clinical Social Work Assessment  Patient Details  Name: Darryl Hooper. MRN: [id number] Date of Birth: 04-29-1930  Date of referral:  12/17/14               Reason for consult:  Facility Placement                Permission sought to share information with:  Family Supports Permission granted to share information::  Yes, Verbal Permission Granted  Name::        Agency::     Relationship::     Contact Information:     Housing/Transportation Living arrangements for the past 2 months:   (home) Source of Information:  Patient, Adult Children Patient Interpreter Needed:  None Criminal Activity/Legal Involvement Pertinent to Current Situation/Hospitalization:  No - Comment as needed Significant Relationships:  Adult Children, Spouse Lives with:  Spouse Do you feel safe going back to the place where you live?  Yes Need for family participation in patient care:  No (Coment)  Care giving concerns:  None   Facilities manager / plan:  CSW met with patient and his daughter this morning to discuss physical therapy recommendations for STR. Patient's daughter explained that patient's wife is at home and has shingles and that is why she has been unable to be at the hospital. Patient expressed that he is willing to go to rehab at discharge and that he has been twice previously to [facility]. Patient and daughter expressed that they might want to consider [facility] this time as it is much closer to patient's wife. Patient expressed he is hopeful he will feel better soon. CSW to initiate bedsearch.  Employment status:  Retired Nurse, adult PT Recommendations:  [place] / Referral to community resources:  [place]  Patient/Family's Response to care:  Agreeable to [facility] Patient/Family's Understanding of and Emotional Response to Diagnosis, Current Treatment, and Prognosis:  Patient and daughter expressed appreciation for CSW  assistance.   Emotional Assessment Appearance:  Appears stated age Attitude/Demeanor/Rapport:   (Tired) Affect (typically observed):  Accepting, Adaptable, Appropriate Orientation:  Oriented to Self, Oriented to Place, Oriented to  Time, Oriented to Situation Alcohol / Substance use:  Not Applicable Psych involvement (Current and /or in the community):  No (Comment)  Discharge Needs  Concerns to be addressed:  Care Coordination Readmission within the last 30 days:  No Current discharge risk:  None Barriers to Discharge:  No Barriers Identified   Shela Leff, LCSW 12/17/2014, 12:18 PM

## 2014-12-17 NOTE — Progress Notes (Addendum)
Surgery  Postoperative day #5.  Patient is somewhat lethargic.  I discussed with Dr. Burt Knack the nature and character of pelvic drainage.  Large amount of Surgicel and Amatine was placed in the pelvis for hemostatic purposes. There was no serosal injuries during the operation. Drainage is noted to be dark looks like old blood. 24-hour rectal tube output has been 310 cc.  The patient had 24 mg of morphine yesterday on the PCA.  He is awake and alert. Daughter at bedside. MAXIMUM TEMPERATURE last night was 101.7. uop 1825 Cr improving.  Physical exam demonstrates:   A frail-appearing 81 year old male. Ostomy is pink and viable appliances empty. Midline wound appears to be clean with no significant drainage. Foley demonstrates slightly green tinged urine. Rectal tube as noted above. Perineal wound appears to be intact with possible early cellulitis no drainage noted. The wound appears to be intact.  5 cc of 30 cc pelvic Foley balloon were removed.  CBC Latest Ref Rng 12/17/2014 12/14/2014 12/13/2014  WBC 3.8 - 10.6 K/uL 13.7(H) 14.5(H) 17.8(H)  Hemoglobin 13.0 - 18.0 g/dL 7.8(L) 8.2(L) 9.7(L)  Hematocrit 40.0 - 52.0 % 23.9(L) 25.3(L) 29.2(L)  Platelets 150 - 440 K/uL 169 131(L) 170    CMP Latest Ref Rng 12/17/2014 12/15/2014 12/14/2014  Glucose 65 - 99 mg/dL 136(H) 202(H) 158(H)  BUN 6 - 20 mg/dL 26(H) 30(H) 31(H)  Creatinine 0.61 - 1.24 mg/dL 1.84(H) 2.33(H) 2.63(H)  Sodium 135 - 145 mmol/L 133(L) 130(L) 132(L)  Potassium 3.5 - 5.1 mmol/L 3.9 3.9 4.3  Chloride 101 - 111 mmol/L 110 105 106  CO2 22 - 32 mmol/L 17(L) 17(L) 18(L)  Calcium 8.9 - 10.3 mg/dL 7.3(L) 7.1(L) 7.3(L)  Total Protein 6.5 - 8.1 g/dL 4.8(L) - -  Total Bilirubin 0.3 - 1.2 mg/dL 0.7 - -  Alkaline Phos 38 - 126 U/L 49 - -  AST 15 - 41 U/L 29 - -  ALT 17 - 63 U/L 22 - -   Pathology results reviewed with the patient and her daughter. Dr. Shari Heritage will see the patient later this week.  Impression making slow progress  postop day 5. I attribute the nature of the rectal drainage to the Avitene and Surgicel placed in the pelvis at the time of closure.   Plan:  We will start decreasing his PCA dose. Continue physical therapy. We will start 30 mg subcutaneous on Lovenox once a day. In addition he will continue his SCDs and a PA and lateral chest x-ray and urinalysis will be obtained due to the postoperative day #4 fever.  I will start Zosyn and dose per pharmacy given elevated creatinine.

## 2014-12-17 NOTE — Progress Notes (Signed)
5 Days Post-Op  Subjective: Postop APR is planning physical therapy this morning. He had a fever last night to 101.7 but afebrile since. Colostomy output yet  Objective: Vital signs in last 24 hours: Temp:  [98.2 F (36.8 C)-101.7 F (38.7 C)] 99.6 F (37.6 C) (07/19 0819) Pulse Rate:  [69-77] 74 (07/19 0819) Resp:  [12-19] 18 (07/19 0819) BP: (134-156)/(53-65) 142/58 mmHg (07/19 0819) SpO2:  [22 %-100 %] 99 % (07/19 0819) FiO2 (%):  [98 %] 98 % (07/19 0818) Weight:  [251 lb 3.2 oz (113.944 kg)] 251 lb 3.2 oz (113.944 kg) (07/19 0521) Last BM Date: 12/14/14  Intake/Output from previous day: 07/18 0701 - 07/19 0700 In: 3453.9 [P.O.:480; I.V.:2973.9] Out: 2135 [Urine:1825; Stool:310] Intake/Output this shift: Total I/O In: 480 [P.O.:480] Out: 450 [Urine:450]  Physical exam:  No ostomy output wound is clean abdomen is soft and nontender drains are present  Lab Results: CBC   Recent Labs  12/17/14 0425  WBC 13.7*  HGB 7.8*  HCT 23.9*  PLT 169   BMET  Recent Labs  12/15/14 0912 12/17/14 0425  NA 130* 133*  K 3.9 3.9  CL 105 110  CO2 17* 17*  GLUCOSE 202* 136*  BUN 30* 26*  CREATININE 2.33* 1.84*  CALCIUM 7.1* 7.3*   PT/INR No results for input(s): LABPROT, INR in the last 72 hours. ABG No results for input(s): PHART, HCO3 in the last 72 hours.  Invalid input(s): PCO2, PO2  Studies/Results: No results found.  Anti-infectives: Anti-infectives    Start     Dose/Rate Route Frequency Ordered Stop   12/12/14 0715  levofloxacin (LEVAQUIN) IVPB 500 mg     500 mg 100 mL/hr over 60 Minutes Intravenous  Once 12/12/14 0713 12/12/14 0829   12/12/14 0600  ertapenem (INVANZ) 1 g in sodium chloride 0.9 % 50 mL IVPB  Status:  Discontinued     1 g 100 mL/hr over 30 Minutes Intravenous On call to O.R. 12/11/14 2304 12/12/14 1507      Assessment/Plan: s/p Procedure(s): ABDOMINAL PERINEAL RESECTION cystoscopy, bilateral retrogrades, left ureteral stent  placement, attempted right stent placement.   Will discuss with Dr. Marina Hooper patient's creatinine is better his white blood cell count is 13.7 and he was febrile last night he has some output from one of his drains which appears feculent in nature and I will review the expected output with Dr. Marina Hooper. Continue nothing by mouth for now.Darryl Glen, MD, FACS  12/17/2014

## 2014-12-18 ENCOUNTER — Inpatient Hospital Stay: Payer: Medicare Other

## 2014-12-18 LAB — COMPREHENSIVE METABOLIC PANEL
ALK PHOS: 44 U/L (ref 38–126)
ALT: 26 U/L (ref 17–63)
ANION GAP: 8 (ref 5–15)
AST: 33 U/L (ref 15–41)
Albumin: 1.9 g/dL — ABNORMAL LOW (ref 3.5–5.0)
BILIRUBIN TOTAL: 0.6 mg/dL (ref 0.3–1.2)
BUN: 26 mg/dL — ABNORMAL HIGH (ref 6–20)
CALCIUM: 7.3 mg/dL — AB (ref 8.9–10.3)
CO2: 16 mmol/L — ABNORMAL LOW (ref 22–32)
Chloride: 111 mmol/L (ref 101–111)
Creatinine, Ser: 1.84 mg/dL — ABNORMAL HIGH (ref 0.61–1.24)
GFR, EST AFRICAN AMERICAN: 37 mL/min — AB (ref >60)
GFR, EST NON AFRICAN AMERICAN: 32 mL/min — AB (ref >60)
Glucose, Bld: 134 mg/dL — ABNORMAL HIGH (ref 65–99)
Potassium: 3.5 mmol/L (ref 3.5–5.1)
SODIUM: 135 mmol/L (ref 135–145)
TOTAL PROTEIN: 4.4 g/dL — AB (ref 6.5–8.1)

## 2014-12-18 LAB — URINALYSIS COMPLETE WITH MICROSCOPIC (ARMC ONLY)
Bilirubin Urine: NEGATIVE
Glucose, UA: 50 mg/dL — AB
Nitrite: NEGATIVE
Protein, ur: NEGATIVE mg/dL
Specific Gravity, Urine: 1.013 (ref 1.005–1.030)
pH: 5 (ref 5.0–8.0)

## 2014-12-18 LAB — CBC WITH DIFFERENTIAL/PLATELET
Basophils Absolute: 0 10*3/uL (ref 0–0.1)
Basophils Relative: 0 %
EOS PCT: 1 %
Eosinophils Absolute: 0.1 10*3/uL (ref 0–0.7)
HEMATOCRIT: 22.1 % — AB (ref 40.0–52.0)
HEMOGLOBIN: 7.1 g/dL — AB (ref 13.0–18.0)
LYMPHS PCT: 4 %
Lymphs Abs: 0.4 10*3/uL — ABNORMAL LOW (ref 1.0–3.6)
MCH: 29.5 pg (ref 26.0–34.0)
MCHC: 32.3 g/dL (ref 32.0–36.0)
MCV: 91.3 fL (ref 80.0–100.0)
MONOS PCT: 5 %
Monocytes Absolute: 0.6 10*3/uL (ref 0.2–1.0)
NEUTROS ABS: 11.3 10*3/uL — AB (ref 1.4–6.5)
Neutrophils Relative %: 90 %
PLATELETS: 176 10*3/uL (ref 150–440)
RBC: 2.42 MIL/uL — AB (ref 4.40–5.90)
RDW: 14 % (ref 11.5–14.5)
WBC: 12.5 10*3/uL — ABNORMAL HIGH (ref 3.8–10.6)

## 2014-12-18 LAB — GLUCOSE, CAPILLARY
GLUCOSE-CAPILLARY: 151 mg/dL — AB (ref 65–99)
Glucose-Capillary: 143 mg/dL — ABNORMAL HIGH (ref 65–99)
Glucose-Capillary: 107 mg/dL — ABNORMAL HIGH (ref 65–99)
Glucose-Capillary: 149 mg/dL — ABNORMAL HIGH (ref 65–99)

## 2014-12-18 LAB — PREPARE RBC (CROSSMATCH)

## 2014-12-18 MED ORDER — MORPHINE SULFATE 2 MG/ML IJ SOLN
2.0000 mg | INTRAMUSCULAR | Status: DC | PRN
  Administered 2014-12-19 – 2014-12-27 (×6): 2 mg via INTRAVENOUS
  Filled 2014-12-18 (×7): qty 1

## 2014-12-18 MED ORDER — ENOXAPARIN SODIUM 40 MG/0.4ML ~~LOC~~ SOLN
40.0000 mg | SUBCUTANEOUS | Status: DC
Start: 2014-12-18 — End: 2014-12-31
  Administered 2014-12-18 – 2014-12-30 (×13): 40 mg via SUBCUTANEOUS
  Filled 2014-12-18 (×13): qty 0.4

## 2014-12-18 MED ORDER — ACETAMINOPHEN 325 MG PO TABS
650.0000 mg | ORAL_TABLET | Freq: Once | ORAL | Status: AC
  Administered 2014-12-18: 650 mg via ORAL
  Filled 2014-12-18: qty 2

## 2014-12-18 MED ORDER — FUROSEMIDE 10 MG/ML IJ SOLN
20.0000 mg | Freq: Once | INTRAMUSCULAR | Status: AC
  Administered 2014-12-18: 20 mg via INTRAVENOUS
  Filled 2014-12-18: qty 2

## 2014-12-18 MED ORDER — HYDROCODONE-ACETAMINOPHEN 5-325 MG PO TABS
1.0000 | ORAL_TABLET | Freq: Four times a day (QID) | ORAL | Status: DC | PRN
Start: 2014-12-18 — End: 2014-12-31
  Administered 2014-12-21 – 2014-12-31 (×23): 1 via ORAL
  Filled 2014-12-18 (×24): qty 1

## 2014-12-18 MED ORDER — SODIUM CHLORIDE 0.9 % IV SOLN
Freq: Once | INTRAVENOUS | Status: AC
  Administered 2014-12-18: 16:00:00 via INTRAVENOUS

## 2014-12-18 NOTE — Consult Note (Signed)
Patient with T2 N0 M0 rectal cancer. Recovering from surgery. He did not receive neoadjuvant treatment given his history of prostate cancer and radiation, but will benefit from adjuvant chemotherapy once he fully recovers. If patient is agreeable, would recommend port placement prior to discharge.  Appreciate consult, full consult to follow.

## 2014-12-18 NOTE — Progress Notes (Signed)
Asked by nurse to evaluate drainage from lower portion of midline  Wound.  Report of large amount of foul smelling drainage noted earlier today soaking gown and bed.  Fever to 101 noted yesterday late afternoon. The patient has been somewhat somnolent today.  The nurse reports minimal output from pelvic drain. An additional 10 cc was removed today at the bedside. Character of the drainage is unchanged.  Emanation of his midline wound demonstrates purulent foul-smelling drainage from the lower midline portion. Multiple staples were removed. I suctioned a large amount of foul anaerobic smelling purulent fluid. The wound was then packed.  Perineal wound looks clean except around drainage catheter exit site. Dressing change orders were implemented.  If drainage remains low will discontinue drain soon.

## 2014-12-18 NOTE — Clinical Social Work Note (Signed)
CSW has extended bed offers to patient and his daughter this morning. They are going to discuss and then let CSW know their decision.  Shela Leff MSW,LCSW [phone]

## 2014-12-18 NOTE — Care Management (Signed)
Discharge plan continues to be SNF.  Followed by CSW.

## 2014-12-18 NOTE — Consult Note (Signed)
WOC ostomy follow up Stoma type/location: LLQ COlostomy.  Bedside nurse reports the stoma is now functioning.  Pouch has been removed and peristomal skin is cleansed.  Midline surgical incision with staples and some brown drainage noted at distal aspect of staple line.   Stomal assessment/size: 1 1/2" slightly oval.  Os at center. Slightly budded Stoma is slightly dusky at 3 o'clock, otherwise pink moist and appears viable.  Nurse relays that ostomy has begun to function. Unclear if pouch leaked or abdominal incision leaked into peristomal area causing a leak.   Peristomal assessment: Intact.  Creasing above stoma is less pronounced today, will use barrier ring around stoma to promote seal.  Treatment options for stomal/peristomal skin: Barrier ring Output Brown effluent, per bedside nurse Ostomy pouching: 2pc. 2 1/4" pouch with barrier ring.  Education provided: Patient asleep with no family at bedside.   Enrolled patient in [place] Start Discharge program: No WOC nurse will continue to follow and remain available to patient, medical and nursing teams.  Domenic Moras RN BSN [place] Pager [phone]

## 2014-12-18 NOTE — Progress Notes (Signed)
6 Days Post-Op  Subjective: Status post APR. Patient was somewhat difficult to arouse earlier this morning but is now awake. There is also some report of some dark drainage from his wound which I did not notice on the dressing today. Was not able to elicit any drainage. He had ostomy output last night.  Objective: Vital signs in last 24 hours: Temp:  [97.5 F (36.4 C)-101.1 F (38.4 C)] 98.3 F (36.8 C) (07/20 0800) Pulse Rate:  [62-76] 64 (07/20 0800) Resp:  [14-23] 20 (07/20 0800) BP: (100-154)/(46-62) 131/54 mmHg (07/20 0800) SpO2:  [93 %-100 %] 97 % (07/20 0800) FiO2 (%):  [92 %-97 %] 96 % (07/20 0359) Weight:  [252 lb 12.8 oz (114.669 kg)] 252 lb 12.8 oz (114.669 kg) (07/20 0107) Last BM Date: 12/18/14  Intake/Output from previous day: 07/19 0701 - 07/20 0700 In: 3300 [P.O.:480; I.V.:2520; IV Piggyback:50] Out: 1850 [Urine:1750; Stool:100] Intake/Output this shift: Total I/O In: 445 [I.V.:416; IV Piggyback:29] Out: 750 [Urine:450; Stool:300]  Physical exam:  More awake when I saw him then reported earlier today. Basically back to his baseline as I know him. He is present. It is soft and nontender wound is clean I see no drainage and no expressible drainage staples are intact. Ostomy has no stool in the bag but it was just changed. Nontender. Drains are in place  Lab Results: CBC   Recent Labs  12/17/14 0425 12/18/14 0431  WBC 13.7* 12.5*  HGB 7.8* 7.1*  HCT 23.9* 22.1*  PLT 169 176   BMET  Recent Labs  12/17/14 0425 12/18/14 0431  NA 133* 135  K 3.9 3.5  CL 110 111  CO2 17* 16*  GLUCOSE 136* 134*  BUN 26* 26*  CREATININE 1.84* 1.84*  CALCIUM 7.3* 7.3*   PT/INR No results for input(s): LABPROT, INR in the last 72 hours. ABG No results for input(s): PHART, HCO3 in the last 72 hours.  Invalid input(s): PCO2, PO2  Studies/Results: No results found.  Anti-infectives: Anti-infectives    Start     Dose/Rate Route Frequency Ordered Stop   12/17/14  1415  piperacillin-tazobactam (ZOSYN) IVPB 3.375 g     3.375 g 12.5 mL/hr over 240 Minutes Intravenous 3 times per day 12/17/14 1402     12/12/14 0715  levofloxacin (LEVAQUIN) IVPB 500 mg     500 mg 100 mL/hr over 60 Minutes Intravenous  Once 12/12/14 0713 12/12/14 0829   12/12/14 0600  ertapenem (INVANZ) 1 g in sodium chloride 0.9 % 50 mL IVPB  Status:  Discontinued     1 g 100 mL/hr over 30 Minutes Intravenous On call to O.R. 12/11/14 2304 12/12/14 1507      Assessment/Plan: s/p Procedure(s): ABDOMINAL PERINEAL RESECTION cystoscopy, bilateral retrogrades, left ureteral stent placement, attempted right stent placement.   Status post APR his lobe and has dropped consistently and he is at a low level at this point where transfusion needs to be considered. However he is several liters positive and I have given him a dose of this morning to see if that raises his hemoglobin at all a H&H will be repeated this afternoon and consideration of transfusion will be entertained later today he is currently stable. He's been discussed with Dr. Marina Gravel as well.Florene Glen, MD, FACS  12/18/2014

## 2014-12-18 NOTE — Progress Notes (Signed)
Spoke with Dr. Burt Knack via [facility] - colostomy now functioning, has leaked into honeycomb surgical dressing.  Orders received to replace honeycomb with dry gauze dressing.

## 2014-12-18 NOTE — Care Management Important Message (Signed)
Important Message  Patient Details  Name: Darryl Hooper. MRN: [id number] Date of Birth: 1929/10/11   Medicare Important Message Given:  Yes-third notification given    [name] 12/18/2014, 10:21 AM

## 2014-12-18 NOTE — Progress Notes (Signed)
Spoke with Dr. Burt Knack in person this morning on the floor regarding patient's lethargy this a.m., as well as hemoglobin values and drainage from incision.

## 2014-12-19 DIAGNOSIS — E1140 Type 2 diabetes mellitus with diabetic neuropathy, unspecified: Secondary | ICD-10-CM

## 2014-12-19 DIAGNOSIS — N189 Chronic kidney disease, unspecified: Secondary | ICD-10-CM

## 2014-12-19 DIAGNOSIS — Z79899 Other long term (current) drug therapy: Secondary | ICD-10-CM

## 2014-12-19 DIAGNOSIS — R5383 Other fatigue: Secondary | ICD-10-CM

## 2014-12-19 DIAGNOSIS — Z7982 Long term (current) use of aspirin: Secondary | ICD-10-CM

## 2014-12-19 DIAGNOSIS — C20 Malignant neoplasm of rectum: Principal | ICD-10-CM

## 2014-12-19 DIAGNOSIS — M1990 Unspecified osteoarthritis, unspecified site: Secondary | ICD-10-CM

## 2014-12-19 DIAGNOSIS — Z9889 Other specified postprocedural states: Secondary | ICD-10-CM

## 2014-12-19 DIAGNOSIS — Z86711 Personal history of pulmonary embolism: Secondary | ICD-10-CM

## 2014-12-19 DIAGNOSIS — N400 Enlarged prostate without lower urinary tract symptoms: Secondary | ICD-10-CM

## 2014-12-19 DIAGNOSIS — E785 Hyperlipidemia, unspecified: Secondary | ICD-10-CM

## 2014-12-19 DIAGNOSIS — I129 Hypertensive chronic kidney disease with stage 1 through stage 4 chronic kidney disease, or unspecified chronic kidney disease: Secondary | ICD-10-CM

## 2014-12-19 LAB — BASIC METABOLIC PANEL
ANION GAP: 7 (ref 5–15)
BUN: 27 mg/dL — ABNORMAL HIGH (ref 6–20)
CHLORIDE: 111 mmol/L (ref 101–111)
CO2: 18 mmol/L — AB (ref 22–32)
CREATININE: 1.93 mg/dL — AB (ref 0.61–1.24)
Calcium: 7.3 mg/dL — ABNORMAL LOW (ref 8.9–10.3)
GFR calc non Af Amer: 30 mL/min — ABNORMAL LOW (ref >60)
GFR, EST AFRICAN AMERICAN: 35 mL/min — AB (ref >60)
Glucose, Bld: 152 mg/dL — ABNORMAL HIGH (ref 65–99)
POTASSIUM: 3.1 mmol/L — AB (ref 3.5–5.1)
Sodium: 136 mmol/L (ref 135–145)

## 2014-12-19 LAB — TYPE AND SCREEN
ABO/RH(D): A POS
Antibody Screen: NEGATIVE
Unit division: 0
Unit division: 0

## 2014-12-19 LAB — GLUCOSE, CAPILLARY
Glucose-Capillary: 150 mg/dL — ABNORMAL HIGH (ref 65–99)
Glucose-Capillary: 182 mg/dL — ABNORMAL HIGH (ref 65–99)
Glucose-Capillary: 175 mg/dL — ABNORMAL HIGH (ref 65–99)
Glucose-Capillary: 187 mg/dL — ABNORMAL HIGH (ref 65–99)

## 2014-12-19 LAB — CBC WITH DIFFERENTIAL/PLATELET
Basophils Absolute: 0 10*3/uL (ref 0–0.1)
Basophils Relative: 0 %
Eosinophils Absolute: 0.2 10*3/uL (ref 0–0.7)
Eosinophils Relative: 1 %
HCT: 27.6 % — ABNORMAL LOW (ref 40.0–52.0)
HEMOGLOBIN: 9.2 g/dL — AB (ref 13.0–18.0)
LYMPHS ABS: 0.4 10*3/uL — AB (ref 1.0–3.6)
MCH: 29.9 pg (ref 26.0–34.0)
MCHC: 33.4 g/dL (ref 32.0–36.0)
MCV: 89.5 fL (ref 80.0–100.0)
Monocytes Absolute: 0.5 10*3/uL (ref 0.2–1.0)
Neutro Abs: 16 10*3/uL — ABNORMAL HIGH (ref 1.4–6.5)
Neutrophils Relative %: 93 %
PLATELETS: 192 10*3/uL (ref 150–440)
RBC: 3.08 MIL/uL — ABNORMAL LOW (ref 4.40–5.90)
RDW: 14 % (ref 11.5–14.5)
WBC: 17.2 10*3/uL — AB (ref 3.8–10.6)

## 2014-12-19 NOTE — Progress Notes (Signed)
7 Days Post-Op  Subjective: Status post APR. Patient had been transfused yesterday 2 units packed red cells. He feels better he also had the midline wound opened with purulent drainage. Currently he feels better overall. He is tolerating a full liquid diet.  Objective: Vital signs in last 24 hours: Temp:  [97.7 F (36.5 C)-100.1 F (37.8 C)] 97.9 F (36.6 C) (07/21 0817) Pulse Rate:  [53-65] 59 (07/21 0817) Resp:  [16-21] 17 (07/21 0817) BP: (120-150)/(45-68) 146/60 mmHg (07/21 0817) SpO2:  [93 %-99 %] 99 % (07/21 0817) Weight:  [254 lb 9.6 oz (115.486 kg)] 254 lb 9.6 oz (115.486 kg) (07/21 0459) Last BM Date: 12/18/14  Intake/Output from previous day: 07/20 0701 - 07/21 0700 In: 3167.1 [P.O.:360; I.V.:1960.1; Blood:729; IV Piggyback:118] Out: 2825 [Urine:2425; Stool:400] Intake/Output this shift: Total I/O In: 305 [P.O.:120; I.V.:185] Out: 0   Physical exam:  Wound with some purulence and minimal erythema. Simi minimally functional. Drains in place Foley catheter has been decreased in diameter in the peroneal drain by Dr. Marina Gravel yesterday.   Lab Results: CBC   Recent Labs  12/18/14 0431 12/19/14 0139  WBC 12.5* 17.2*  HGB 7.1* 9.2*  HCT 22.1* 27.6*  PLT 176 192   BMET  Recent Labs  12/18/14 0431 12/19/14 0139  NA 135 136  K 3.5 3.1*  CL 111 111  CO2 16* 18*  GLUCOSE 134* 152*  BUN 26* 27*  CREATININE 1.84* 1.93*  CALCIUM 7.3* 7.3*   PT/INR No results for input(s): LABPROT, INR in the last 72 hours. ABG No results for input(s): PHART, HCO3 in the last 72 hours.  Invalid input(s): PCO2, PO2  Studies/Results: Dg Chest 2 View  12/18/2014   CLINICAL DATA:  Fever.  EXAM: CHEST  2 VIEW  COMPARISON:  12/10/2008  FINDINGS: Moderate pneumoperitoneum in this patient status post partial colon resection 12/12/2014.  Small bilateral pleural effusion. Opacity over the elevated right diaphragm is linear and consistent with atelectasis. Mild cardiomegaly. Negative  aortic and hilar contours for technique.  IMPRESSION: 1. Small bilateral pleural effusion with mild basilar atelectasis. 2. Moderate pneumoperitoneum.   Electronically Signed   By: Monte Fantasia M.D.   On: 12/18/2014 07:50    Anti-infectives: Anti-infectives    Start     Dose/Rate Route Frequency Ordered Stop   12/17/14 1415  piperacillin-tazobactam (ZOSYN) IVPB 3.375 g     3.375 g 12.5 mL/hr over 240 Minutes Intravenous 3 times per day 12/17/14 1402     12/12/14 0715  levofloxacin (LEVAQUIN) IVPB 500 mg     500 mg 100 mL/hr over 60 Minutes Intravenous  Once 12/12/14 0713 12/12/14 0829   12/12/14 0600  ertapenem (INVANZ) 1 g in sodium chloride 0.9 % 50 mL IVPB  Status:  Discontinued     1 g 100 mL/hr over 30 Minutes Intravenous On call to O.R. 12/11/14 2304 12/12/14 1507      Assessment/Plan: s/p Procedure(s): ABDOMINAL PERINEAL RESECTION cystoscopy, bilateral retrogrades, left ureteral stent placement, attempted right stent placement.   Patient doing better will advance diet today. He is having his Foley catheter decreased slightly every other day. He was transfused and is doing better today recommend continuing antibiotic since starting dressing changes on the abdominal wound.  Florene Glen, MD, FACS  12/19/2014

## 2014-12-19 NOTE — Consult Note (Signed)
[place]  Telephone:([phone] [phone]  ID: Darryl Hooper. OB: 02-06-1930  MR#: [id number]  QJF#:[id number]  Patient Care Team: Arlis Porta., MD as PCP - General (Family Medicine) Clent Jacks, RN as Registered Nurse  CHIEF COMPLAINT: No chief complaint on file.   INTERVAL HISTORY: Patient is an 81 year old male admitted for surgery for his known rectal adenocarcinoma. She tolerated this procedure well. He is slightly lethargic today, but improved since yesterday by report. He does not report any pain currently. Patient offers no specific complaints.  REVIEW OF SYSTEMS:   Review of Systems  Constitutional: Negative for fever.  Respiratory: Negative.   Cardiovascular: Negative.   Gastrointestinal: Negative.     As per HPI. Otherwise, a complete review of systems is negatve.  PAST MEDICAL HISTORY: Past Medical History  Diagnosis Date  . Hypertension   . Peripheral vascular disease   . Hyperlipidemia   . Diarrhea   . Diabetes mellitus without complication   . BPH (benign prostatic hyperplasia)   . Prostate cancer   . Arthritis   . Knee pain, bilateral   . Rectal cancer   . Chronic kidney disease     incontience  . IBS (irritable bowel syndrome)   . Neuropathy due to secondary diabetes mellitus   . Hx of pulmonary embolus     2011--treated with Coumadin    PAST SURGICAL HISTORY: Past Surgical History  Procedure Laterality Date  . Prostatectomy    . Joint replacement      left knee  . Colonoscopy N/A 10/23/2014    Procedure: COLONOSCOPY;  Surgeon: Lucilla Lame, MD;  Location: [facility];  Service: Gastroenterology;  Laterality: N/A;  . Knee arthroscopy w/ meniscal repair Left 2012  . Eus N/A 11/14/2014    Procedure: LOWER ENDOSCOPIC ULTRASOUND (EUS);  Surgeon: Holly Bodily, MD;  Location: [facility];  Service: Endoscopy;  Laterality: N/A;  . Eye surgery Bilateral     cataract extraction  .  Tonsillectomy    . Abdominal perineal bowel resection N/A 12/12/2014    Procedure: ABDOMINAL PERINEAL RESECTION;  Surgeon: [name], MD;  Location: [facility] ORS;  Service: General;  Laterality: N/A;  . Cystoscopy with stent placement Bilateral 12/12/2014    Procedure: cystoscopy, bilateral retrogrades, left ureteral stent placement, attempted right stent placement.;  Surgeon: Hollice Espy, MD;  Location: [facility] ORS;  Service: Urology;  Laterality: Bilateral;    FAMILY HISTORY Family History  Problem Relation Age of Onset  . Heart disease Mother   . Heart disease Father   . Cancer Father 42    Colon  . Multiple sclerosis Son        ADVANCED DIRECTIVES:    HEALTH MAINTENANCE: History  Substance Use Topics  . Smoking status: Never Smoker   . Smokeless tobacco: Never Used  . Alcohol Use: No     Colonoscopy:  PAP:  Bone density:  Lipid panel:  No Known Allergies  Current Facility-Administered Medications  Medication Dose Route Frequency Provider Last Rate Last Dose  . acetaminophen (TYLENOL) tablet 650 mg  650 mg Oral Q6H PRN Florene Glen, MD   650 mg at 12/17/14 2051  . alvimopan (ENTEREG) capsule 12 mg  12 mg Oral BID [name], MD   12 mg at 12/19/14 0939  . amLODipine (NORVASC) tablet 2.5 mg  2.5 mg Oral Daily [name], MD   2.5 mg at 12/19/14 0940  . aspirin EC tablet 81 mg  81  mg Oral Daily [name], MD   81 mg at 12/19/14 0174  . enoxaparin (LOVENOX) injection 40 mg  40 mg Subcutaneous Q24H [name], MD   40 mg at 12/18/14 2028  . gabapentin (NEURONTIN) capsule 100 mg  100 mg Oral QHS [name], MD   100 mg at 12/18/14 2108  . HYDROcodone-acetaminophen (NORCO/VICODIN) 5-325 MG per tablet 1 tablet  1 tablet Oral Q6H PRN [name], MD      . insulin aspart (novoLOG) injection 0-15 Units  0-15 Units Subcutaneous TID WC [name], MD   3 Units at 12/19/14 1638  . losartan (COZAAR) tablet 100 mg  100 mg Oral Daily [name], MD   100 mg at 12/19/14 0939  . morphine 2  MG/ML injection 2 mg  2 mg Intravenous Q2H PRN [name], MD   2 mg at 12/19/14 1159  . ondansetron (ZOFRAN) tablet 4 mg  4 mg Oral Q6H PRN [name], MD       Or  . ondansetron [facility]) injection 4 mg  4 mg Intravenous Q6H PRN [name], MD   4 mg at 12/17/14 1343  . piperacillin-tazobactam (ZOSYN) IVPB 3.375 g  3.375 g Intravenous 3 times per day Ramond Dial, RPH   3.375 g at 12/19/14 1453  . sodium chloride 0.9 % injection 3 mL  3 mL Intravenous Q12H [name], MD   3 mL at 12/19/14 1000    OBJECTIVE: Filed Vitals:   12/19/14 1543  BP: 144/54  Pulse: 64  Temp: 98.6 F (37 C)  Resp: 20     Body mass index is 32.67 kg/(m^2).    ECOG FS:1 - Symptomatic but completely ambulatory  General: Well-developed, well-nourished, no acute distress. Eyes: Pink conjunctiva, anicteric sclera. HEENT: Normocephalic, moist mucous membranes, clear oropharnyx. Lungs: Clear to auscultation bilaterally. Heart: Regular rate and rhythm. No rubs, murmurs, or gallops. Abdomen: Soft, nontender, nondistended. No organomegaly noted, normoactive bowel sounds. Musculoskeletal: No edema, cyanosis, or clubbing. Neuro: Alert, answering all questions appropriately. Cranial nerves grossly intact. Skin: No rashes or petechiae noted. Psych: Normal affect. Lymphatics: No cervical, calvicular, axillary or inguinal LAD.   LAB RESULTS:  Lab Results  Component Value Date   NA 136 12/19/2014   K 3.1* 12/19/2014   CL 111 12/19/2014   CO2 18* 12/19/2014   GLUCOSE 152* 12/19/2014   BUN 27* 12/19/2014   CREATININE 1.93* 12/19/2014   CALCIUM 7.3* 12/19/2014   PROT 4.4* 12/18/2014   ALBUMIN 1.9* 12/18/2014   AST 33 12/18/2014   ALT 26 12/18/2014   ALKPHOS 44 12/18/2014   BILITOT 0.6 12/18/2014   GFRNONAA 30* 12/19/2014   GFRAA 35* 12/19/2014    Lab Results  Component Value Date   WBC 17.2* 12/19/2014   NEUTROABS 16.0* 12/19/2014   HGB 9.2* 12/19/2014   HCT 27.6* 12/19/2014   MCV 89.5 12/19/2014   PLT  192 12/19/2014     STUDIES: Dg Chest 2 View  12/18/2014   CLINICAL DATA:  Fever.  EXAM: CHEST  2 VIEW  COMPARISON:  12/10/2008  FINDINGS: Moderate pneumoperitoneum in this patient status post partial colon resection 12/12/2014.  Small bilateral pleural effusion. Opacity over the elevated right diaphragm is linear and consistent with atelectasis. Mild cardiomegaly. Negative aortic and hilar contours for technique.  IMPRESSION: 1. Small bilateral pleural effusion with mild basilar atelectasis. 2. Moderate pneumoperitoneum.   Electronically Signed   By: Monte Fantasia M.D.   On: 12/18/2014 07:50   US Renal  12/13/2014   CLINICAL DATA:  Oliguria  EXAM: RENAL / URINARY TRACT ULTRASOUND COMPLETE  COMPARISON:  None.  FINDINGS: Right Kidney:  Length: 11.0 cm.  No mass or hydronephrosis.  Left Kidney:  Length: 11.4 cm.  Mild fullness of the renal collecting system.  Bladder:  Not visualized/decompressed by indwelling Foley catheter.  IMPRESSION: Mild fullness of the left renal collecting system without frank hydronephrosis.  Bladder decompressed by indwelling Foley catheter.   Electronically Signed   By: Julian Hy M.D.   On: 12/13/2014 10:39   Dg C-arm 1-60 Min-no Report  12/12/2014   CLINICAL DATA: stent placement   C-ARM 1-60 MINUTES  Fluoroscopy was utilized by the requesting physician.  No radiographic  interpretation.     ASSESSMENT: Pathologic stage I rectal carcinoma.  PLAN:    1. Rectal cancer:  Final pathology results noted. Previously, PET scan did not reveal any metastatic disease. Patient did not receive neoadjuvant treatment given his history of brachytherapy to his prostate. Although he is only stage I, he still will benefit from adjuvant chemotherapy with FOLFOX. Would not initiate treatment for 4-6 weeks after his surgery. If patient wishes to proceed with chemotherapy, he will require port placement in the future. Return to clinic in approximately 3 weeks for further evaluation  and treatment planning.  Appreciate consult, call with questions.    Lloyd Huger, MD   12/19/2014 5:42 PM

## 2014-12-19 NOTE — Progress Notes (Signed)
Physical Therapy Treatment Patient Details Name: Darryl Hooper. MRN: [id number] DOB: 05-Feb-1930 Today's Date: 12/19/2014    History of Present Illness Pt is an 81 yo male admitted to the hospital s/p APR surgery for rectal cancer.     PT Comments    Pt still presents with generalized weakness, unable to fully perform transfers. He does, however, participate in therapy and is improving his repetitions with therapeutic exercise. Due to deconditioning and general weakness, pt will continue to benefit from skilled PT in order to address above deficits and return to premorbid state. All mobility today was performed on 3L O2. Pre-activity vitals: O2sat: 99%, HR: 65. Post-activity vitals: O2sat: 97%, HR: 72.   Follow Up Recommendations  SNF     Equipment Recommendations  None recommended by PT    Recommendations for Other Services       Precautions / Restrictions Precautions Precautions: Fall Restrictions Weight Bearing Restrictions: No    Mobility  Bed Mobility Overal bed mobility: Needs Assistance;+2 for physical assistance Bed Mobility: Supine to Sit;Sit to Supine     Supine to sit: Max assist;+2 for physical assistance Sit to supine: Max assist;+2 for safety/equipment   General bed mobility comments: Pt requires body weight support assist as well as assist with bilateral LEs secondary to general deconditioning.   Transfers Overall transfer level: Needs assistance Equipment used: Rolling walker (2 wheeled) Transfers: Sit to/from Stand Sit to Stand: Max assist;+2 safety/equipment;From elevated surface         General transfer comment: Pt attempted 3 sit-to-stands requiring tactile cueing on posterior hips to facilitate hip extension. He requires significant bodyweight support even with the HOB elevated. Never could quite get into full extension.  Ambulation/Gait             General Gait Details: Unable to attempt at this time due to balance, fatigue, and  weakness. Pt unsafe to ambulate   Stairs            Wheelchair Mobility    Modified Rankin (Stroke Patients Only)       Balance Overall balance assessment: Needs assistance Sitting-balance support: Feet supported Sitting balance-Leahy Scale: Fair       Standing balance-Leahy Scale: Zero Standing balance comment: Not able to reach full standing                     Cognition Arousal/Alertness: Awake/alert Behavior During Therapy: WFL for tasks assessed/performed Overall Cognitive Status: Within Functional Limits for tasks assessed                      Exercises Total Joint Exercises Ankle Circles/Pumps: Both;15 reps;AROM Quad Sets: Both;AROM;15 reps Gluteal Sets: Both;15 reps;AROM Hip ABduction/ADduction: Both;15 reps;AROM Straight Leg Raises: Both;AROM;15 reps General Exercises - Lower Extremity Heel Slides: Both;15 reps;AROM    General Comments        Pertinent Vitals/Pain Pain Assessment: No/denies pain    Home Living                      Prior Function            PT Goals (current goals can now be found in the care plan section) Acute Rehab PT Goals Patient Stated Goal: To try to participate in therapy  PT Goal Formulation: With patient/family Time For Goal Achievement: 12/30/14 Potential to Achieve Goals: Good Progress towards PT goals: Progressing toward goals    Frequency  Min 2X/week  PT Plan Current plan remains appropriate    Co-evaluation             End of Session Equipment Utilized During Treatment: Gait belt;Oxygen Activity Tolerance: Patient limited by fatigue Patient left: in bed;with call bell/phone within reach;with nursing/sitter in room;with family/visitor present (Nurses stated they would handle bed alarm)     Time: 8333-8329 PT Time Calculation (min) (ACUTE ONLY): 30 min  Charges:                       G CodesJanyth Contes 01/09/2015, 12:16 PM  Janyth Contes,  SPT. [phone]

## 2014-12-19 NOTE — Consult Note (Signed)
WOC ostomy consult note Stoma type/location: LLQ Colostomy Patient is much more alert today, was talking on phone to wife.  Color improved.   Output Only stoma sweat in pouch today.  No stool noted.  Ostomy pouching: 2pc. 2 1/4" with barrier ring.  Intact  Education provided: Son at bedside.  Interested in advancing diet and stated that MD has advanced to soft diet.  Will check with bedside RN no pouch change needed today.  Will change tomorrow, family plans to be there, hopeful for ostomy function.  Enrolled patient in [place] program:No [place] team will continue to follow and remain available to pateint, medical and nursing teams.   Domenic Moras RN BSN [place] Pager [phone]

## 2014-12-19 NOTE — Care Management Note (Signed)
Case Management Note  Patient Details  Name: Darryl Hooper. MRN: [id number] Date of Birth: [birth year]  Subjective/Objective:                    Action/Plan:   Expected Discharge Date:                  Expected Discharge Plan:  Skilled Nursing Facility  In-House Referral:  Clinical Social Work  Discharge planning Services     Post Acute Care Choice:    Choice offered to:     DME Arranged:    DME Agency:     HH Arranged:    [place] Agency:     Status of Service:  Completed, signed off  Medicare Important Message Given:  Yes-third notification given Date Medicare IM Given:    Medicare IM give by:    Date Additional Medicare IM Given:    Additional Medicare Important Message give by:     If discussed at [place] of Stay Meetings, dates discussed:    Additional Comments:  Alvie Heidelberg, RN 12/19/2014, 2:06 PM

## 2014-12-20 LAB — GLUCOSE, CAPILLARY
GLUCOSE-CAPILLARY: 159 mg/dL — AB (ref 65–99)
GLUCOSE-CAPILLARY: 207 mg/dL — AB (ref 65–99)
GLUCOSE-CAPILLARY: 215 mg/dL — AB (ref 65–99)
Glucose-Capillary: 177 mg/dL — ABNORMAL HIGH (ref 65–99)

## 2014-12-20 NOTE — Progress Notes (Signed)
8 Days Post-Op  Subjective: Status post abdominal perineal resection. Tolerating a regular diet with good ostomy output. Low-grade fever persists.  Objective: Vital signs in last 24 hours: Temp:  [98.6 F (37 C)-99.2 F (37.3 C)] 98.8 F (37.1 C) (07/22 0808) Pulse Rate:  [57-64] 57 (07/22 0808) Resp:  [18-20] 18 (07/22 0808) BP: (137-144)/(54-62) 137/61 mmHg (07/22 0808) SpO2:  [98 %-100 %] 98 % (07/22 0808) Weight:  [252 lb 1.6 oz (114.352 kg)] 252 lb 1.6 oz (114.352 kg) (07/22 0500) Last BM Date: 12/18/14  Intake/Output from previous day: 07/21 0701 - 07/22 0700 In: 955 [P.O.:360; I.V.:495; IV Piggyback:100] Out: 750 [Urine:750] Intake/Output this shift:    Physical exam:  Some purulent drainage but dressing changes are more frequent now and improving no erythema ostomy is functional but minimal. 5 cc's removed from the perineal drain Foley balloon.  Lab Results: CBC   Recent Labs  12/18/14 0431 12/19/14 0139  WBC 12.5* 17.2*  HGB 7.1* 9.2*  HCT 22.1* 27.6*  PLT 176 192   BMET  Recent Labs  12/18/14 0431 12/19/14 0139  NA 135 136  K 3.5 3.1*  CL 111 111  CO2 16* 18*  GLUCOSE 134* 152*  BUN 26* 27*  CREATININE 1.84* 1.93*  CALCIUM 7.3* 7.3*   PT/INR No results for input(s): LABPROT, INR in the last 72 hours. ABG No results for input(s): PHART, HCO3 in the last 72 hours.  Invalid input(s): PCO2, PO2  Studies/Results: No results found.  Anti-infectives: Anti-infectives    Start     Dose/Rate Route Frequency Ordered Stop   12/17/14 1415  piperacillin-tazobactam (ZOSYN) IVPB 3.375 g     3.375 g 12.5 mL/hr over 240 Minutes Intravenous 3 times per day 12/17/14 1402     12/12/14 0715  levofloxacin (LEVAQUIN) IVPB 500 mg     500 mg 100 mL/hr over 60 Minutes Intravenous  Once 12/12/14 0713 12/12/14 0829   12/12/14 0600  ertapenem (INVANZ) 1 g in sodium chloride 0.9 % 50 mL IVPB  Status:  Discontinued     1 g 100 mL/hr over 30 Minutes Intravenous On  call to O.R. 12/11/14 2304 12/12/14 1507      Assessment/Plan: s/p Procedure(s): ABDOMINAL PERINEAL RESECTION cystoscopy, bilateral retrogrades, left ureteral stent placement, attempted right stent placement.   Disease to improve drain balloon is decreased today continue current therapy with wound dressings and IV anabiotic's.Florene Glen, MD, FACS  12/20/2014

## 2014-12-20 NOTE — Clinical Social Work Note (Signed)
Patient's wife and daughter are going to tour [facility] and will call CSW with their decision. Patient not yet medically ready for discharge.  Shela Leff  [phone]

## 2014-12-20 NOTE — Clinical Social Work Note (Signed)
Bed accepted at [facility] by family. CSW has informed [facility]. Patient will have to have his rectal tube discontinued prior to discharge. Shela Leff MSW,LCSW [phone]

## 2014-12-20 NOTE — Progress Notes (Signed)
Nutrition Follow-up     INTERVENTION:   Meals and snacks: Cater to pt preferences Nutrition diet education: Discussed new colostomy Medical Nutrition therapy with pt and wife at bedside.  Verbalized understanding  NUTRITION DIAGNOSIS:   Inadequate oral intake related to altered GI function as evidenced by  (NPO/CL day 5), improving as on solid foods    GOAL:   Patient will meet greater than or equal to 90% of their needs  Coming close to meeting nutritional needs  MONITOR:    (Energy intake, digestive system)  REASON FOR ASSESSMENT:   NPO/Clear Liquid Diet    ASSESSMENT:   Pt s/p parital colon resection, colostomy   Current Nutrition: eating lunch during visit and tolerating well. Reports ate 100% supper and breakfast this am (first solid meals)  Wife reports good appetite prior to admission as well   Gastrointestinal Profile:  Last BM: 0 output in colostomy   Medications: reviewed  Electrolyte/Renal Profile and Glucose Profile:   Recent Labs Lab 12/17/14 0425 12/18/14 0431 12/19/14 0139  NA 133* 135 136  K 3.9 3.5 3.1*  CL 110 111 111  CO2 17* 16* 18*  BUN 26* 26* 27*  CREATININE 1.84* 1.84* 1.93*  CALCIUM 7.3* 7.3* 7.3*  GLUCOSE 136* 134* 152*   Protein Profile:  Recent Labs Lab 12/17/14 0425 12/18/14 0431  ALBUMIN 2.3* 1.9*       Weight Trend since Admission: Filed Weights   12/18/14 0107 12/19/14 0459 12/20/14 0500  Weight: 252 lb 12.8 oz (114.669 kg) 254 lb 9.6 oz (115.486 kg) 252 lb 1.6 oz (114.352 kg)      Diet Order:  DIET SOFT Room service appropriate?: Yes; Fluid consistency:: Thin  Skin:   reviewed  Height:   Ht Readings from Last 1 Encounters:  12/12/14 6\' 2"  (1.88 m)    Weight:   Wt Readings from Last 1 Encounters:  12/20/14 252 lb 1.6 oz (114.352 kg)       Wt Readings from Last 10 Encounters:  12/20/14 252 lb 1.6 oz (114.352 kg)  12/03/14 220 lb (99.791 kg)  11/19/14 222 lb 3.2 oz (100.789 kg)   11/15/14 221 lb 0.2 oz (100.25 kg)  11/08/14 223 lb 6.4 oz (101.334 kg)  11/08/14 222 lb 7.1 oz (100.9 kg)  11/05/14 224 lb (101.606 kg)  10/23/14 212 lb (96.163 kg)    BMI:  Body mass index is 32.35 kg/(m^2).  Estimated Nutritional Needs:   Kcal:  Using IBW of 86 kg BEE 1624 kcals (IF 1.0-1.2, AF 1.2) 6283-6629 kcals/d.   Protein:  Using IBW of 86kg (1.2-1.5 g/kg) 103-129 g/d  Fluid:  Using IBW of 86kg (25-42ml/kg) 2150-2524ml/d  EDUCATION NEEDS:   No education needs identified at this time  LOW Care Level  Alika Saladin B. Zenia Resides, [place], [place] (pager)

## 2014-12-20 NOTE — Progress Notes (Signed)
Physical Therapy Treatment Patient Details Name: Darryl Hooper. MRN: [id number] DOB: [birth year] Today's Date: 12/20/2014    History of Present Illness Pt is an 81 yo male admitted to the hospital s/p APR surgery for rectal cancer.     PT Comments    Pt making progress towards goals and remains very willing to participate in therapy despite fatigue and pain. Today, pt noted quite a bit of pain in his L knee (as he states, from a prior TKR). Pain limited pt's ability to perform transfer activities, but he was able to make progress with sitting balance, including bilateral reaching activities x 10 overhead and min assist. Pt will continue to benefit from skilled PT in order to recover strength and activity tolerance for eventual safe return home.   Follow Up Recommendations  SNF     Equipment Recommendations  None recommended by PT    Recommendations for Other Services       Precautions / Restrictions Precautions Precautions: Fall Restrictions Weight Bearing Restrictions: No    Mobility  Bed Mobility Overal bed mobility: Needs Assistance;+2 for physical assistance Bed Mobility: Supine to Sit;Sit to Supine     Supine to sit: Max assist;+2 for physical assistance Sit to supine: Max assist;+2 for safety/equipment   General bed mobility comments:  (Needs cueing on where to place his hands )  Transfers                 General transfer comment: Not assessed today secondary to pt complaint of bad knee pain. Therapist simulated a sit-to-stand with a leg press into the therapist's hands and the pt had a hard time tolerating that pain.   Ambulation/Gait             General Gait Details: Unable to attempt at this time due to balance, fatigue, pain, and weakness. Pt unsafe to ambulate   Stairs            Wheelchair Mobility    Modified Rankin (Stroke Patients Only)       Balance Overall balance assessment: Needs assistance Sitting-balance support: Feet  supported Sitting balance-Leahy Scale: Fair                              Cognition Arousal/Alertness: Awake/alert Behavior During Therapy: WFL for tasks assessed/performed Overall Cognitive Status: Within Functional Limits for tasks assessed                      Exercises Total Joint Exercises Ankle Circles/Pumps: Both;AROM Quad Sets: Strengthening;Both;Supine;5 reps Gluteal Sets: Both;10 reps;AROM Hip ABduction/ADduction: Strengthening;Both;5 reps;Supine Straight Leg Raises: Strengthening;Both;5 reps;Supine General Exercises - Lower Extremity Heel Slides: Both;15 reps;AROM Other Exercises Other Exercises: Pt performed 10 reps of leg press into therapist's hands. Pt noted knee pain with most therapeutic exercise this date.  Other Exercises: Pt performed 5 min sitting balance with LAQx 10 bilaterally requiring supervision for technique. His balance was maintained with bilateral hand support and occasional min assist for balance correction (pt tends to lean posterior and to his left)    General Comments        Pertinent Vitals/Pain Pain Assessment: 0-10 Pain Score: 5  Pain Location: L knee (Pt complains of bad L knee pain) Pain Intervention(s): Limited activity within patient's tolerance;Utilized relaxation techniques;Monitored during session;Other (comment) (Nurse was notified of pain. )    Home Living  Prior Function            PT Goals (current goals can now be found in the care plan section) Acute Rehab PT Goals Patient Stated Goal: To do what he can PT Goal Formulation: With patient/family Time For Goal Achievement: 12/30/14 Potential to Achieve Goals: Good Progress towards PT goals: Progressing toward goals    Frequency  Min 2X/week    PT Plan Current plan remains appropriate    Co-evaluation             End of Session Equipment Utilized During Treatment: Gait belt;Oxygen Activity Tolerance: Patient  limited by pain Patient left: in bed;with call bell/phone within reach;with nursing/sitter in room;with family/visitor present     Time: 1779-3903 PT Time Calculation (min) (ACUTE ONLY): 30 min  Charges:                       G CodesJanyth Contes 2015/01/02, 4:10 PM  Janyth Contes, SPT. [phone]

## 2014-12-20 NOTE — Progress Notes (Signed)
ANTIBIOTIC CONSULT NOTE - Follow up  Pharmacy Consult for Zosyn Indication: cellulitis/post op fever  No Known Allergies  Patient Measurements: Height: 6\' 2"  (188 cm) Weight: 252 lb 1.6 oz (114.352 kg) IBW/kg (Calculated) : 82.2 Adjusted Body Weight:   Vital Signs: Temp: 98.8 F (37.1 C) (07/22 0808) Temp Source: Oral (07/22 0808) BP: 137/61 mmHg (07/22 0808) Pulse Rate: 57 (07/22 0808) Intake/Output from previous day: 07/21 0701 - 07/22 0700 In: 955 [P.O.:360; I.V.:495; IV Piggyback:100] Out: 750 [Urine:750] Intake/Output from this shift: Total I/O In: 120 [P.O.:120] Out: -   Labs:  Recent Labs  12/18/14 0431 12/19/14 0139  WBC 12.5* 17.2*  HGB 7.1* 9.2*  PLT 176 192  CREATININE 1.84* 1.93*   Estimated Creatinine Clearance: 39 mL/min (by C-G formula based on Cr of 1.93). No results for input(s): VANCOTROUGH, VANCOPEAK, VANCORANDOM, GENTTROUGH, GENTPEAK, GENTRANDOM, TOBRATROUGH, TOBRAPEAK, TOBRARND, AMIKACINPEAK, AMIKACINTROU, AMIKACIN in the last 72 hours.   Microbiology: No results found for this or any previous visit (from the past 720 hour(s)).  Medical History: Past Medical History  Diagnosis Date  . Hypertension   . Peripheral vascular disease   . Hyperlipidemia   . Diarrhea   . Diabetes mellitus without complication   . BPH (benign prostatic hyperplasia)   . Prostate cancer   . Arthritis   . Knee pain, bilateral   . Rectal cancer   . Chronic kidney disease     incontience  . IBS (irritable bowel syndrome)   . Neuropathy due to secondary diabetes mellitus   . Hx of pulmonary embolus     2011--treated with Coumadin    Medications:  Scheduled:  . amLODipine  2.5 mg Oral Daily  . aspirin EC  81 mg Oral Daily  . enoxaparin (LOVENOX) injection  40 mg Subcutaneous Q24H  . gabapentin  100 mg Oral QHS  . insulin aspart  0-15 Units Subcutaneous TID WC  . losartan  100 mg Oral Daily  . piperacillin-tazobactam (ZOSYN)  IV  3.375 g Intravenous 3  times per day  . sodium chloride  3 mL Intravenous Q12H   Assessment: Patient is a 81 year old man post op ostomy with possible earl cellulitis and fever Renal function stable  Goal of Therapy:  Resolution of infection  Plan:  Continue pip/tazo 3.375mg  q 8hr ei dosing  Karah Caruthers D Jamal Pavon 12/20/2014,11:57 AM

## 2014-12-20 NOTE — Progress Notes (Signed)
Physical Therapy Treatment Patient Details Name: Darryl Hooper. MRN: [id number] DOB: 1929-12-20 Today's Date: 12/20/2014    History of Present Illness Pt is an 81 yo male admitted to the hospital s/p APR surgery for rectal cancer.     PT Comments    Pt making progress towards goals and remains very willing to participate in therapy despite fatigue and pain. Today, pt noted quite a bit of pain in his L knee (as he states, from a prior TKR). Pain limited pt's ability to perform transfer activities, but he was able to make progress with sitting balance, including bilateral reaching activities x 10 overhead and min assist. Pt will continue to benefit from skilled PT in order to recover strength and activity tolerance for eventual safe return home. All activity performed today was performed on 3L O2. Pre-activity vitals = 97% O2sat, HR of 66. Post-activity vitals = 95% O2sat, HR of 75. At no point during mobility did the pt's O2sat fall below 91%.  Follow Up Recommendations  SNF     Equipment Recommendations  None recommended by PT    Recommendations for Other Services       Precautions / Restrictions Precautions Precautions: Fall Restrictions Weight Bearing Restrictions: No    Mobility  Bed Mobility Overal bed mobility: Needs Assistance;+2 for physical assistance Bed Mobility: Supine to Sit;Sit to Supine     Supine to sit: Max assist;+2 for physical assistance Sit to supine: Max assist;+2 for safety/equipment   General bed mobility comments:  (Needs cueing on where to place his hands )  Transfers                 General transfer comment: Not assessed today secondary to pt complaint of bad knee pain. Therapist simulated a sit-to-stand with a leg press into the therapist's hands and the pt had a hard time tolerating that pain.   Ambulation/Gait             General Gait Details: Unable to attempt at this time due to balance, fatigue, pain, and weakness. Pt unsafe  to ambulate   Stairs            Wheelchair Mobility    Modified Rankin (Stroke Patients Only)       Balance Overall balance assessment: Needs assistance Sitting-balance support: Feet supported Sitting balance-Leahy Scale: Fair                              Cognition Arousal/Alertness: Awake/alert Behavior During Therapy: WFL for tasks assessed/performed Overall Cognitive Status: Within Functional Limits for tasks assessed                      Exercises Total Joint Exercises Ankle Circles/Pumps: Both;AROM Quad Sets: Strengthening;Both;Supine;5 reps Gluteal Sets: Both;10 reps;AROM Hip ABduction/ADduction: Strengthening;Both;5 reps;Supine Straight Leg Raises: Strengthening;Both;5 reps;Supine General Exercises - Lower Extremity Heel Slides: Both;15 reps;AROM Other Exercises Other Exercises: Pt performed 10 reps of leg press into therapist's hands. Pt noted knee pain with most therapeutic exercise this date.  Other Exercises: Pt performed 5 min sitting balance with LAQx 10 bilaterally requiring supervision for technique. His balance was maintained with bilateral hand support and occasional min assist for balance correction (pt tends to lean posterior and to his left)    General Comments        Pertinent Vitals/Pain Pain Assessment: 0-10 Pain Score: 5  Pain Location: L knee (Pt complains of bad L  knee pain) Pain Intervention(s): Limited activity within patient's tolerance;Utilized relaxation techniques;Monitored during session;Other (comment) (Nurse was notified of pain. )    Home Living                      Prior Function            PT Goals (current goals can now be found in the care plan section) Acute Rehab PT Goals Patient Stated Goal: To do what he can PT Goal Formulation: With patient/family Time For Goal Achievement: 12/30/14 Potential to Achieve Goals: Good Progress towards PT goals: Progressing toward goals     Frequency  Min 2X/week    PT Plan Current plan remains appropriate    Co-evaluation             End of Session Equipment Utilized During Treatment: Gait belt;Oxygen Activity Tolerance: Patient limited by pain Patient left: in bed;with call bell/phone within reach;with nursing/sitter in room;with family/visitor present     Time: 3235-5732 PT Time Calculation (min) (ACUTE ONLY): 30 min  Charges:                       G CodesJanyth Contes 01-15-15, 4:12 PM Janyth Contes, SPT. [phone]

## 2014-12-20 NOTE — Care Management Important Message (Signed)
Important Message  Patient Details  Name: Darryl Hooper. MRN: [id number] Date of Birth: 05/30/1930   Medicare Important Message Given:  Yes-fourth notification given    [name] 12/20/2014, 9:39 AM

## 2014-12-21 LAB — CBC WITH DIFFERENTIAL/PLATELET
BASOS ABS: 0 10*3/uL (ref 0–0.1)
BASOS PCT: 0 %
EOS ABS: 0.3 10*3/uL (ref 0–0.7)
Eosinophils Relative: 3 %
HCT: 25.8 % — ABNORMAL LOW (ref 40.0–52.0)
HEMOGLOBIN: 8.5 g/dL — AB (ref 13.0–18.0)
LYMPHS PCT: 5 %
Lymphs Abs: 0.5 10*3/uL — ABNORMAL LOW (ref 1.0–3.6)
MCH: 29.3 pg (ref 26.0–34.0)
MCHC: 33.1 g/dL (ref 32.0–36.0)
MCV: 88.7 fL (ref 80.0–100.0)
MONOS PCT: 4 %
Monocytes Absolute: 0.5 10*3/uL (ref 0.2–1.0)
NEUTROS ABS: 10.1 10*3/uL — AB (ref 1.4–6.5)
Neutrophils Relative %: 88 %
Platelets: 222 10*3/uL (ref 150–440)
RBC: 2.91 MIL/uL — AB (ref 4.40–5.90)
RDW: 14.3 % (ref 11.5–14.5)
WBC: 11.5 10*3/uL — ABNORMAL HIGH (ref 3.8–10.6)

## 2014-12-21 LAB — BASIC METABOLIC PANEL
Anion gap: 6 (ref 5–15)
BUN: 26 mg/dL — AB (ref 6–20)
CO2: 20 mmol/L — ABNORMAL LOW (ref 22–32)
CREATININE: 1.9 mg/dL — AB (ref 0.61–1.24)
Calcium: 7.4 mg/dL — ABNORMAL LOW (ref 8.9–10.3)
Chloride: 110 mmol/L (ref 101–111)
GFR calc Af Amer: 36 mL/min — ABNORMAL LOW (ref >60)
GFR, EST NON AFRICAN AMERICAN: 31 mL/min — AB (ref >60)
GLUCOSE: 172 mg/dL — AB (ref 65–99)
Potassium: 3.1 mmol/L — ABNORMAL LOW (ref 3.5–5.1)
SODIUM: 136 mmol/L (ref 135–145)

## 2014-12-21 LAB — GLUCOSE, CAPILLARY
GLUCOSE-CAPILLARY: 165 mg/dL — AB (ref 65–99)
GLUCOSE-CAPILLARY: 214 mg/dL — AB (ref 65–99)
Glucose-Capillary: 219 mg/dL — ABNORMAL HIGH (ref 65–99)
Glucose-Capillary: 211 mg/dL — ABNORMAL HIGH (ref 65–99)

## 2014-12-21 NOTE — Progress Notes (Signed)
9 Days Post-Op  Subjective: Patient feels well today is tolerating a regular diet.  Objective: Vital signs in last 24 hours: Temp:  [98.2 F (36.8 C)-99.2 F (37.3 C)] 98.2 F (36.8 C) (07/23 0744) Pulse Rate:  [54-64] 54 (07/23 0744) Resp:  [17-24] 17 (07/23 0744) BP: (133-160)/(49-61) 133/49 mmHg (07/23 0744) SpO2:  [98 %-100 %] 99 % (07/23 0744) Weight:  [249 lb 9 oz (113.2 kg)] 249 lb 9 oz (113.2 kg) (07/23 0441) Last BM Date: 12/18/14  Intake/Output from previous day: 07/22 0701 - 07/23 0700 In: 341 [P.O.:300; I.V.:3; IV Piggyback:38] Out: 1400 [Urine:1200; Stool:200] Intake/Output this shift: Total I/O In: 200 [Other:150; IV Piggyback:50] Out: 170 [Urine:150; Stool:20]  Physical exam:  Soft nontender abdomen ostomy is functional wound shows minimal purulence with no erythema.  Lab Results: CBC   Recent Labs  12/19/14 0139 12/21/14 0715  WBC 17.2* 11.5*  HGB 9.2* 8.5*  HCT 27.6* 25.8*  PLT 192 222   BMET  Recent Labs  12/19/14 0139 12/21/14 0715  NA 136 136  K 3.1* 3.1*  CL 111 110  CO2 18* 20*  GLUCOSE 152* 172*  BUN 27* 26*  CREATININE 1.93* 1.90*  CALCIUM 7.3* 7.4*   PT/INR No results for input(s): LABPROT, INR in the last 72 hours. ABG No results for input(s): PHART, HCO3 in the last 72 hours.  Invalid input(s): PCO2, PO2  Studies/Results: No results found.  Anti-infectives: Anti-infectives    Start     Dose/Rate Route Frequency Ordered Stop   12/17/14 1415  piperacillin-tazobactam (ZOSYN) IVPB 3.375 g     3.375 g 12.5 mL/hr over 240 Minutes Intravenous 3 times per day 12/17/14 1402     12/12/14 0715  levofloxacin (LEVAQUIN) IVPB 500 mg     500 mg 100 mL/hr over 60 Minutes Intravenous  Once 12/12/14 0713 12/12/14 0829   12/12/14 0600  ertapenem (INVANZ) 1 g in sodium chloride 0.9 % 50 mL IVPB  Status:  Discontinued     1 g 100 mL/hr over 30 Minutes Intravenous On call to O.R. 12/11/14 2304 12/12/14 1507       Assessment/Plan: s/p Procedure(s): ABDOMINAL PERINEAL RESECTION cystoscopy, bilateral retrogrades, left ureteral stent placement, attempted right stent placement.   5 cc removed from the Foley balloon drain catheter. This was discussed with Dr. Marina Gravel this morning. Will need placement at some point but should be ready to go home soon.Florene Glen, MD, FACS  12/21/2014

## 2014-12-22 LAB — GLUCOSE, CAPILLARY
GLUCOSE-CAPILLARY: 160 mg/dL — AB (ref 65–99)
Glucose-Capillary: 166 mg/dL — ABNORMAL HIGH (ref 65–99)
Glucose-Capillary: 222 mg/dL — ABNORMAL HIGH (ref 65–99)
Glucose-Capillary: 227 mg/dL — ABNORMAL HIGH (ref 65–99)

## 2014-12-22 MED ORDER — POTASSIUM CHLORIDE CRYS ER 20 MEQ PO TBCR
20.0000 meq | EXTENDED_RELEASE_TABLET | Freq: Two times a day (BID) | ORAL | Status: DC
  Administered 2014-12-22 – 2014-12-31 (×16): 20 meq via ORAL
  Filled 2014-12-22 (×16): qty 1

## 2014-12-22 MED ORDER — FUROSEMIDE 10 MG/ML IJ SOLN
20.0000 mg | Freq: Every day | INTRAMUSCULAR | Status: DC
  Administered 2014-12-22 – 2014-12-24 (×2): 20 mg via INTRAVENOUS
  Filled 2014-12-22 (×2): qty 2

## 2014-12-22 NOTE — Discharge Summary (Signed)
Physician Discharge Summary  Patient ID: Darryl Hooper. MRN: [id number] DOB/AGE: September 17, 1929 81 y.o.  Admit date: 12/12/2014 Discharge date: 12/22/2014  Admission Diagnoses: Rectal Cancer   Discharge Diagnoses:  Rectal cancer Oliguria-resolved.   Discharged Condition: Stable and improving.  Hospital Course:   This represents an interim discharge summary.    The patient had an APR for rectal cancer day of admission,  postoperatively he had on postop day 1 and 2 low urine output and elevated creatinine. Rectal tube drainage was checked for creatinine and this was found to rule out an intraperitoneal leak. Retroperitoneal ultrasound demonstrated slight right hydronephrosis which will be addressed at a later time by urology. The time of his initial operation the patient had a stent placed on the left side which was subsequently removed at the end of the operation however a stent could not be placed on the right side due to possible intrinsic stenosis. His postoperative course was marred by delayed return of bowel function for probably 5-6 days. Pain control was achieved with a morphine PCA. He tolerated this well. Physical therapy did see him and he made some progress. The pelvic drain was able to be slowly deflated over the course of several days and was eventually removed on postoperative day #10. There was a fibrinous tract which made require some debridement of the near future. The patient did have fevers to 101 during his hospitalization and on July 20 a lower midline wound infection was opened at the bedside and packed.  He was given a 5 day course of intravenous Zosyn. He was maintained also on Lovenox during his hospitalization for DVT prophylaxis.  His ostomy began functioning and his diet was able to be advanced to regular diet and he tolerated this well. He did receive a 2 unit blood transfusion for a low hemoglobin and overall weakness. He also received some Lasix towards the end of  his hospitalization as he was 9 L up on his I's and O's.   case management and social worker arrange for him to be transferred to a significant with rehabilitation. Dr. Shari Heritage of oncology did see him with the initial plan of adjuvant chemotherapy once he is rehabilitated.    Consults: Urology  oncology   Significant Diagnostic Studies: Ultrasound retroperitoneum, multiple chest xrays.   Disposition: SNF and Rehab.    Medication List    ASK your doctor about these medications        acetaminophen 500 MG tablet  Commonly known as:  TYLENOL  Take 1,000 mg by mouth every 6 (six) hours as needed for moderate pain.     amLODipine 2.5 MG tablet  Commonly known as:  NORVASC  Take 1 tablet by mouth daily.     bisacodyl 5 MG EC tablet  Commonly known as:  bisacodyl  Take 4 tablets at 8am, the day before your surgery     cholecalciferol 1000 UNITS tablet  Commonly known as:  VITAMIN D  Take 2,000 Units by mouth daily.     gabapentin 100 MG capsule  Commonly known as:  NEURONTIN  Take 1 capsule (100 mg total) by mouth at bedtime.     glimepiride 4 MG tablet  Commonly known as:  AMARYL  Take 4 mg by mouth daily.     losartan 100 MG tablet  Commonly known as:  COZAAR  Take 100 mg by mouth daily.     polyethylene glycol powder powder  Commonly known as:  GLYCOLAX/MIRALAX  Mix miralax with  64 oz. Of gatorade and drink one 8 oz. Glass every 15-20 minutes until gone (for surgery prep)         Signed: [name] 12/22/2014, 7:55 PM

## 2014-12-22 NOTE — Progress Notes (Signed)
Patient ID: Darryl Hooper., male   DOB: 13-Jun-1929, 81 y.o.   MRN: [id number]   Surgery Progress Note.  POD 10 Zosyn Day 5 S/P APR  He is without complaints, pelvic foley removed today,   Anticipating SNF transfer this week.    Filed Vitals:   12/22/14 0500 12/22/14 0725 12/22/14 1612 12/22/14 1613  BP:  136/64  167/68  Pulse:  55 65 64  Temp:  98.6 F (37 C) 98.3 F (36.8 C)   TempSrc:  Oral Oral   Resp:   20   Height:      Weight: 251 lb 9.6 oz (114.125 kg)     SpO2:  98% 100% 100%    PE  Ostomy functional, marked whole body edema perienum intact staple and suture line, drain exit site with fibrinous debris.   abd soft Lungs clear.   Labs  CBC Latest Ref Rng 12/21/2014 12/19/2014 12/18/2014  WBC 3.8 - 10.6 K/uL 11.5(H) 17.2(H) 12.5(H)  Hemoglobin 13.0 - 18.0 g/dL 8.5(L) 9.2(L) 7.1(L)  Hematocrit 40.0 - 52.0 % 25.8(L) 27.6(L) 22.1(L)  Platelets 150 - 440 K/uL 222 192 176   CMP Latest Ref Rng 12/21/2014 12/19/2014 12/18/2014  Glucose 65 - 99 mg/dL 172(H) 152(H) 134(H)  BUN 6 - 20 mg/dL 26(H) 27(H) 26(H)  Creatinine 0.61 - 1.24 mg/dL 1.90(H) 1.93(H) 1.84(H)  Sodium 135 - 145 mmol/L 136 136 135  Potassium 3.5 - 5.1 mmol/L 3.1(L) 3.1(L) 3.5  Chloride 101 - 111 mmol/L 110 111 111  CO2 22 - 32 mmol/L 20(L) 18(L) 16(L)  Calcium 8.9 - 10.3 mg/dL 7.4(L) 7.3(L) 7.3(L)  Total Protein 6.5 - 8.1 g/dL - - 4.4(L)  Total Bilirubin 0.3 - 1.2 mg/dL - - 0.6  Alkaline Phos 38 - 126 U/L - - 44  AST 15 - 41 U/L - - 33  ALT 17 - 63 U/L - - 26    I/O last 3 completed shifts: In: 1823.4 [P.O.:1380; Other:150; IV Piggyback:293.4] Out: 2605 [Urine:1885; Stool:720]   He is up about 9 liters since admission.  IMP  Making slow progress, will need to watch perineal wound closely as may need local debridement of tract.  Continue dressing changes  Would benefit from some lasix 20 mg qday for 3 doses.  Plan:  Anticipate SNF transfer this week, replete K with Kdur and IV K (as  needed). Will not likely tolerate any chemotherapy until much stronger and healed. If discharged will need office follow up in 7-10 days.

## 2014-12-22 NOTE — Progress Notes (Signed)
10 Days Post-Op  Subjective: Status post APR for low cancer. Is doing very well having no problems.  Objective: Vital signs in last 24 hours: Temp:  [98.6 F (37 C)-99.2 F (37.3 C)] 98.6 F (37 C) (07/24 0725) Pulse Rate:  [55-66] 55 (07/24 0725) Resp:  [16-18] 16 (07/24 0042) BP: (123-151)/(56-69) 136/64 mmHg (07/24 0725) SpO2:  [98 %-100 %] 98 % (07/24 0725) Weight:  [251 lb 9.6 oz (114.125 kg)] 251 lb 9.6 oz (114.125 kg) (07/24 0500) Last BM Date: 12/22/14  Intake/Output from previous day: 07/23 0701 - 07/24 0700 In: 1062 [P.O.:720; IV Piggyback:192] Out: 1320 [Urine:1050; Stool:270] Intake/Output this shift: Total I/O In: 0  Out: 500 [Urine:250; Stool:250]  Physical exam:  The dressing had not been changed since yesterday and there was purulence on the dressing. A dressing was removed and inspected granulation tissue is present. Ostomy is functional. Abdomen is otherwise soft and nontender. Perineal drain is removed at this point. And there is no erythema.  Lab Results: CBC   Recent Labs  12/21/14 0715  WBC 11.5*  HGB 8.5*  HCT 25.8*  PLT 222   BMET  Recent Labs  12/21/14 0715  NA 136  K 3.1*  CL 110  CO2 20*  GLUCOSE 172*  BUN 26*  CREATININE 1.90*  CALCIUM 7.4*   PT/INR No results for input(s): LABPROT, INR in the last 72 hours. ABG No results for input(s): PHART, HCO3 in the last 72 hours.  Invalid input(s): PCO2, PO2  Studies/Results: No results found.  Anti-infectives: Anti-infectives    Start     Dose/Rate Route Frequency Ordered Stop   12/17/14 1415  piperacillin-tazobactam (ZOSYN) IVPB 3.375 g     3.375 g 12.5 mL/hr over 240 Minutes Intravenous 3 times per day 12/17/14 1402     12/12/14 0715  levofloxacin (LEVAQUIN) IVPB 500 mg     500 mg 100 mL/hr over 60 Minutes Intravenous  Once 12/12/14 0713 12/12/14 0829   12/12/14 0600  ertapenem (INVANZ) 1 g in sodium chloride 0.9 % 50 mL IVPB  Status:  Discontinued     1 g 100 mL/hr over  30 Minutes Intravenous On call to O.R. 12/11/14 2304 12/12/14 1507      Assessment/Plan: s/p Procedure(s): ABDOMINAL PERINEAL RESECTION cystoscopy, bilateral retrogrades, left ureteral stent placement, attempted right stent placement.   Perineal drain removed today doing very well could be able to be transferred to  SNF in the next 1-2 days.Florene Glen, MD, FACS  12/22/2014

## 2014-12-23 ENCOUNTER — Encounter: Payer: Self-pay | Admitting: Surgery

## 2014-12-23 ENCOUNTER — Encounter
Admission: RE | Disposition: A | Discharge status: Skilled Nursing Facility | Payer: Self-pay | Source: Ambulatory Visit | Attending: Surgery

## 2014-12-23 ENCOUNTER — Inpatient Hospital Stay: Payer: Medicare Other | Admitting: Anesthesiology

## 2014-12-23 DIAGNOSIS — T814XXA Infection following a procedure, initial encounter: Secondary | ICD-10-CM

## 2014-12-23 HISTORY — PX: IRRIGATION AND DEBRIDEMENT ABSCESS: SHX5252

## 2014-12-23 LAB — BASIC METABOLIC PANEL
Anion gap: 9 (ref 5–15)
BUN: 27 mg/dL — ABNORMAL HIGH (ref 6–20)
CHLORIDE: 105 mmol/L (ref 101–111)
CO2: 21 mmol/L — AB (ref 22–32)
CREATININE: 1.96 mg/dL — AB (ref 0.61–1.24)
Calcium: 7.5 mg/dL — ABNORMAL LOW (ref 8.9–10.3)
GFR calc Af Amer: 35 mL/min — ABNORMAL LOW (ref >60)
GFR calc non Af Amer: 30 mL/min — ABNORMAL LOW (ref >60)
Glucose, Bld: 183 mg/dL — ABNORMAL HIGH (ref 65–99)
Potassium: 3.4 mmol/L — ABNORMAL LOW (ref 3.5–5.1)
Sodium: 135 mmol/L (ref 135–145)

## 2014-12-23 LAB — CBC
HCT: 27.7 % — ABNORMAL LOW (ref 40.0–52.0)
Hemoglobin: 9.8 g/dL — ABNORMAL LOW (ref 13.0–18.0)
MCH: 31.3 pg (ref 26.0–34.0)
MCHC: 35.4 g/dL (ref 32.0–36.0)
MCV: 88.2 fL (ref 80.0–100.0)
PLATELETS: 244 10*3/uL (ref 150–440)
RBC: 3.14 MIL/uL — AB (ref 4.40–5.90)
RDW: 14.3 % (ref 11.5–14.5)
WBC: 10.3 10*3/uL (ref 3.8–10.6)

## 2014-12-23 LAB — GLUCOSE, CAPILLARY
GLUCOSE-CAPILLARY: 156 mg/dL — AB (ref 65–99)
GLUCOSE-CAPILLARY: 233 mg/dL — AB (ref 65–99)
Glucose-Capillary: 205 mg/dL — ABNORMAL HIGH (ref 65–99)
Glucose-Capillary: 137 mg/dL — ABNORMAL HIGH (ref 65–99)
Glucose-Capillary: 308 mg/dL — ABNORMAL HIGH (ref 65–99)

## 2014-12-23 SURGERY — IRRIGATION AND DEBRIDEMENT ABSCESS
Anesthesia: General | Wound class: Dirty or Infected

## 2014-12-23 MED ORDER — HYDROMORPHONE HCL 1 MG/ML IJ SOLN: 0.2500 mg | INTRAMUSCULAR | Status: DC | PRN

## 2014-12-23 MED ORDER — LIDOCAINE HCL (CARDIAC) 20 MG/ML IV SOLN
INTRAVENOUS | Status: DC | PRN
  Administered 2014-12-23: 80 mg via INTRAVENOUS

## 2014-12-23 MED ORDER — FENTANYL CITRATE (PF) 100 MCG/2ML IJ SOLN: 25.0000 ug | INTRAMUSCULAR | Status: DC | PRN

## 2014-12-23 MED ORDER — LACTATED RINGERS IV SOLN
INTRAVENOUS | Status: DC | PRN
  Administered 2014-12-23: 13:00:00 via INTRAVENOUS

## 2014-12-23 MED ORDER — ONDANSETRON HCL 4 MG/2ML IJ SOLN: 4.0000 mg | Freq: Once | INTRAMUSCULAR | Status: DC | PRN

## 2014-12-23 MED ORDER — PROPOFOL 10 MG/ML IV BOLUS
INTRAVENOUS | Status: DC | PRN
  Administered 2014-12-23: 100 mg via INTRAVENOUS

## 2014-12-23 MED ORDER — DEXAMETHASONE SODIUM PHOSPHATE 10 MG/ML IJ SOLN
INTRAMUSCULAR | Status: DC | PRN
  Administered 2014-12-23: 10 mg via INTRAVENOUS

## 2014-12-23 MED ORDER — FENTANYL CITRATE (PF) 100 MCG/2ML IJ SOLN
INTRAMUSCULAR | Status: DC | PRN
  Administered 2014-12-23 (×2): 25 ug via INTRAVENOUS

## 2014-12-23 SURGICAL SUPPLY — 24 items
BLADE SURG SZ11 CARB STEEL (BLADE) ×3 IMPLANT
BNDG GAUZE 4.5X4.1 6PLY STRL (MISCELLANEOUS) ×3 IMPLANT
BRIEF STRETCH MATERNITY 2XLG (MISCELLANEOUS) ×3 IMPLANT
CANISTER SUCT 1200ML W/VALVE (MISCELLANEOUS) ×3 IMPLANT
DRAIN PENROSE 1/4X12 LTX (DRAIN) IMPLANT
DRAIN PENROSE 5/8X12 LTX STRL (DRAIN) IMPLANT
DRAPE LAPAROTOMY 100X77 ABD (DRAPES) ×3 IMPLANT
DRAPE LEGGINS SURG 28X43 STRL (DRAPES) ×3 IMPLANT
DRAPE UNDER BUTTOCK W/FLU (DRAPES) ×3 IMPLANT
GAUZE SPONGE 4X4 12PLY STRL (GAUZE/BANDAGES/DRESSINGS) IMPLANT
GLOVE BIO SURGEON STRL SZ7.5 (GLOVE) ×6 IMPLANT
GLOVE INDICATOR 8.0 STRL GRN (GLOVE) ×3 IMPLANT
GOWN STRL REUS W/ TWL LRG LVL3 (GOWN DISPOSABLE) ×2 IMPLANT
GOWN STRL REUS W/TWL LRG LVL3 (GOWN DISPOSABLE) ×4
KIT RM TURNOVER CYSTO AR (KITS) ×3 IMPLANT
LABEL OR SOLS (LABEL) ×3 IMPLANT
NS IRRIG 500ML POUR BTL (IV SOLUTION) ×3 IMPLANT
PACK BASIN MINOR ARMC (MISCELLANEOUS) ×3 IMPLANT
PAD ABD DERMACEA PRESS 5X9 (GAUZE/BANDAGES/DRESSINGS) ×3 IMPLANT
PAD PREP 24X41 OB/GYN DISP (PERSONAL CARE ITEMS) ×3 IMPLANT
SOL PREP PVP 2OZ (MISCELLANEOUS) ×6
SOLUTION PREP PVP 2OZ (MISCELLANEOUS) ×2 IMPLANT
SPONGE LAP 18X18 5 PK (GAUZE/BANDAGES/DRESSINGS) ×3 IMPLANT
SWAB CULTURE AMIES ANAERIB BLU (MISCELLANEOUS) IMPLANT

## 2014-12-23 NOTE — Progress Notes (Signed)
Physical Therapy Treatment Patient Details Name: Darryl Hooper. MRN: [id number] DOB: Jun 19, 1929 Today's Date: 12/23/2014    History of Present Illness Pt is an 81 yo male admitted to the hospital s/p APR surgery for rectal cancer.     PT Comments    Pt making gradual progression towards goals, however he remains limited by pain in R knee and his significant deconditioning. Bed exercises and sitting balance are the extent to which the pt can tolerate this date. He remains motivated to participate in therapy, and will continue to benefit from skilled PT in order to restore strength and endurance for eventual safe return home.   Follow Up Recommendations  SNF     Equipment Recommendations  None recommended by PT    Recommendations for Other Services       Precautions / Restrictions Precautions Precautions: Fall Restrictions Weight Bearing Restrictions: No    Mobility  Bed Mobility Overal bed mobility: Needs Assistance;+2 for physical assistance Bed Mobility: Supine to Sit;Sit to Supine     Supine to sit: Max assist;+2 for physical assistance Sit to supine: Max assist;+2 for safety/equipment   General bed mobility comments: Pt requires body weight support assist as well as assist with bilateral LEs secondary to general deconditioning.   Transfers                 General transfer comment: Not assessed today secondary to general weakness and acute pain in knee and abdomen    Ambulation/Gait             General Gait Details: Unable to attempt at this time due to balance, fatigue, pain, and weakness. Pt unsafe to ambulate   Stairs            Wheelchair Mobility    Modified Rankin (Stroke Patients Only)       Balance Overall balance assessment: Needs assistance Sitting-balance support: Feet supported;No upper extremity supported Sitting balance-Leahy Scale: Fair       Standing balance-Leahy Scale: Zero Standing balance comment: Not able to  stand secondary to weakness and balance impairments                    Cognition Arousal/Alertness: Awake/alert Behavior During Therapy: WFL for tasks assessed/performed Overall Cognitive Status: Within Functional Limits for tasks assessed                      Exercises Other Exercises Other Exercises: Pt performed 15 reps of leg press into therapist's hands. Other Exercises: Pt performed 5 min sitting balance with LAQx 15 bilaterally requiring supervision for technique. His balance was maintained with bilateral hand support and occasional min assist for balance correction (pt tends to lean posterior and to his left) (Requires VC to lift chest and look upward)    General Comments        Pertinent Vitals/Pain Pain Assessment: 0-10 Pain Score: 6  Pain Location: L knee Pain Intervention(s): Limited activity within patient's tolerance;Monitored during session;Utilized relaxation techniques;Premedicated before session    Home Living                      Prior Function            PT Goals (current goals can now be found in the care plan section) Acute Rehab PT Goals Patient Stated Goal: To do exercises PT Goal Formulation: With patient/family Time For Goal Achievement: 12/30/14 Potential to Achieve Goals: Good Progress towards PT  goals: Progressing toward goals    Frequency  Min 2X/week    PT Plan Current plan remains appropriate    Co-evaluation             End of Session Equipment Utilized During Treatment: Gait belt;Oxygen Activity Tolerance: Patient limited by fatigue;Patient limited by pain Patient left: in bed;with call bell/phone within reach;with bed alarm set;with SCD's reapplied     Time: 9326-7124 PT Time Calculation (min) (ACUTE ONLY): 26 min  Charges:                       G CodesJanyth Contes 01-22-15, 10:40 AM  Janyth Contes, SPT. [phone]

## 2014-12-23 NOTE — Progress Notes (Signed)
Noted staples to abd and colostomy bag with green  Stool noted from prior surgery

## 2014-12-23 NOTE — Progress Notes (Signed)
Surgery Progress Note  S:  No acute issues O: Blood pressure 149/53, pulse 57, temperature 98.9 F (37.2 C), temperature source Oral, resp. rate 20, height 6\' 2"  (1.88 m), weight 113.2 kg (249 lb 9 oz), SpO2 98 %. GEN: NAD/A&Ox3 ABD: incision c/d/i, small area of open wound with good granulation Perineum: significant necrosis to perineum with staples  A/P 81 yo M s/p APR with perineal necrosis/infection - to OR debridement of perineum

## 2014-12-23 NOTE — Anesthesia Preprocedure Evaluation (Addendum)
Anesthesia Evaluation  Patient identified by MRN, date of birth, ID band Patient awake    Reviewed: Allergy & Precautions, H&P , NPO status , Patient's Chart, lab work & pertinent test results, reviewed documented beta blocker date and time   Airway Mallampati: II  TM Distance: >3 FB Neck ROM: full    Dental   Pulmonary          Cardiovascular hypertension, + Peripheral Vascular Disease Normal cardiovascular examRate:Normal     Neuro/Psych  Neuromuscular disease    GI/Hepatic GERD-  ,  Endo/Other  diabetes  Renal/GU Renal disease     Musculoskeletal   Abdominal   Peds  Hematology   Anesthesia Other Findings   Reproductive/Obstetrics                            Anesthesia Physical Anesthesia Plan  ASA: III  Anesthesia Plan: General LMA   Post-op Pain Management:    Induction:   Airway Management Planned:   Additional Equipment:   Intra-op Plan:   Post-operative Plan:   Informed Consent: I have reviewed the patients History and Physical, chart, labs and discussed the procedure including the risks, benefits and alternatives for the proposed anesthesia with the patient or authorized representative who has indicated his/her understanding and acceptance.     Plan Discussed with: CRNA  Anesthesia Plan Comments:        Anesthesia Quick Evaluation

## 2014-12-23 NOTE — Transfer of Care (Signed)
Immediate Anesthesia Transfer of Care Note  Patient: Darryl Hooper.  Procedure(s) Performed: Procedure(s): IRRIGATION AND DEBRIDEMENT ABSCESS/ PERINEAL WOUND DEBRIDMENT (N/A)  Patient Location: PACU  Anesthesia Type:General  Level of Consciousness: awake, alert  and oriented  Airway & Oxygen Therapy: face mask  Post-op Assessment: Report given to RN and Post -op Vital signs reviewed and stable  Post vital signs: Reviewed and stable  Last Vitals:  Filed Vitals:   12/23/14 0743  BP: 149/53  Pulse: 57  Temp: 37.2 C  Resp: 20    Complications: No apparent anesthesia complications

## 2014-12-23 NOTE — Brief Op Note (Signed)
12/12/2014 - 12/23/2014  1:43 PM  PATIENT:  [name]  81 y.o. male  PRE-OPERATIVE DIAGNOSIS:  PERINEAL WOUND with purulence and necrosis s/p APR  POST-OPERATIVE DIAGNOSIS:  Same  PROCEDURE:  Procedure(s): IRRIGATION AND DEBRIDEMENT ABSCESS/ PERINEAL WOUND DEBRIDMENT (N/A)  8 x 3 x 5 cm  SURGEON:  Surgeon(s) and Role:    * Marlyce Huge, MD - Primary  PHYSICIAN ASSISTANT:   ASSISTANTS: none   ANESTHESIA:   general  EBL:  Total I/O In: 0  Out: 800 [Urine:800]  BLOOD ADMINISTERED:none  DRAINS: none   LOCAL MEDICATIONS USED:  NONE  SPECIMEN:  No Specimen  DISPOSITION OF SPECIMEN:  N/A  COUNTS:  YES  TOURNIQUET:  * No tourniquets in log *  DICTATION: .Note written in EPIC  PLAN OF CARE: Admit to inpatient   PATIENT DISPOSITION:  PACU - hemodynamically stable.   Delay start of Pharmacological VTE agent (>24hrs) due to surgical blood loss or risk of bleeding: no

## 2014-12-23 NOTE — Care Management Important Message (Signed)
Important Message  Patient Details  Name: Darryl Hooper. MRN: [id number] Date of Birth: 1930-01-15   Medicare Important Message Given:  Yes-fourth notification given    [name] 12/23/2014, 10:24 AM

## 2014-12-23 NOTE — Anesthesia Postprocedure Evaluation (Signed)
  Anesthesia Post-op Note  Patient: Darryl Hooper.  Procedure(s) Performed: Procedure(s): IRRIGATION AND DEBRIDEMENT ABSCESS/ PERINEAL WOUND DEBRIDMENT (N/A)  Anesthesia type:General LMA  Patient location: PACU  Post pain: Pain level controlled  Post assessment: Post-op Vital signs reviewed, Patient's Cardiovascular Status Stable, Respiratory Function Stable, Patent Airway and No signs of Nausea or vomiting  Post vital signs: Reviewed and stable  Last Vitals:  Filed Vitals:   12/23/14 1428  BP:   Pulse: 50  Temp: 37.2 C  Resp: 11    Level of consciousness: awake, alert  and patient cooperative  Complications: No apparent anesthesia complications

## 2014-12-23 NOTE — Progress Notes (Signed)
Inpatient Diabetes Program Recommendations  AACE/ADA: New Consensus Statement on Inpatient Glycemic Control (2013)  Target Ranges:  Prepandial:   less than 140 mg/dL      Peak postprandial:   less than 180 mg/dL (1-2 hours)      Critically ill patients:  140 - 180 mg/dL   Results for CAYSEN, WHANG (MRN [id number]) as of 12/23/2014 09:19  Ref. Range 12/22/2014 07:25 12/22/2014 11:16 12/22/2014 16:39 12/22/2014 21:08 12/23/2014 07:41  Glucose-Capillary Latest Ref Range: 65-99 mg/dL 160 (H) 166 (H) 222 (H) 227 (H) 205 (H)    Current orders for Inpatient glycemic control: Novolog 0-15 units TID with meals  Inpatient Diabetes Program Recommendations Correction (SSI): Please consider ordeirng Novolog bedtime correction scale. Insulin - Meal Coverage: While inpatient and when diet is resumed, please consider ordering Novolog 3 units TID with meals for meal coverage if patient eats at least 50% of meals.  Thanks, Barnie Alderman, RN, MSN, CCRN, CDE Diabetes Coordinator Inpatient Diabetes Program [phone] (Team Pager from [place] to [place]) [phone] ([place] office) [phone] [facility] office) [phone] [facility] office)

## 2014-12-23 NOTE — Op Note (Signed)
Preoperative diagnosis: Perineal wound dehiscence with purulence and infection Postoperative diagnosis: Same Procedure performed: Debridemen of perineal wound 8 x 3 x 5 cm  Surgeon: Yeudiel Mateo EBL: 58ml Anesthesia: LMA, general Specimen: None Complications: None  Indication for surgery: [name] is a pleasant 81 yo M who had recently underwent APR.  He developed a large draining perineal wound with significant necrosis which required operative debridement  Details of procedure: Informed consent was obtained.  Mr. Earnest was brought to the or suite and laid supine on the OR table.  He was induced, LMA placed and general anesthesia was administered.  His legs were placed in stirrups to allow access to the perineum.  His perineum was then prepped and draped.  A timeout was then performed correctly identifying patient name, operative site and procedure to be performed.  I then proceded to examine his perineum.  There were multiple areas of necrosis on the skin which were loosely held in place with staples and sutures.  These were then removed.  The perineum was debrided of any necrotic tissue to healthy bleeding tissue.  The wound was then packed with betadyne soaked gauze.  A sterile dressing was placed over the wound.  The patient was then awoken, LMA was removed and he was brought to PACU.  There were no immediate complications.  The needle, sponge and instrument count was correct at the end of the procedure.

## 2014-12-24 DIAGNOSIS — T8131XA Disruption of external operation (surgical) wound, not elsewhere classified, initial encounter: Secondary | ICD-10-CM

## 2014-12-24 LAB — GLUCOSE, CAPILLARY
GLUCOSE-CAPILLARY: 249 mg/dL — AB (ref 65–99)
GLUCOSE-CAPILLARY: 209 mg/dL — AB (ref 65–99)
Glucose-Capillary: 249 mg/dL — ABNORMAL HIGH (ref 65–99)
Glucose-Capillary: 224 mg/dL — ABNORMAL HIGH (ref 65–99)

## 2014-12-24 MED ORDER — SODIUM CHLORIDE 0.9 % IV SOLN
INTRAVENOUS | Status: DC
  Administered 2014-12-25 – 2014-12-26 (×4): via INTRAVENOUS
  Administered 2014-12-26: 1 mL via INTRAVENOUS
  Administered 2014-12-27 – 2014-12-31 (×8): via INTRAVENOUS

## 2014-12-24 NOTE — Clinical Social Work Note (Signed)
Surgeon states that patient will need a wound vac at discharge. CSW has informed Broadus John at [employer] to prepare for a wound vac for when patient is ready to discharge. Shela Leff MSW,LCSW [phone]

## 2014-12-24 NOTE — Progress Notes (Signed)
Inpatient Diabetes Program Recommendations  AACE/ADA: New Consensus Statement on Inpatient Glycemic Control (2013)  Target Ranges:  Prepandial:   less than 140 mg/dL      Peak postprandial:   less than 180 mg/dL (1-2 hours)      Critically ill patients:  140 - 180 mg/dL   Results for Darryl Hooper, Darryl Hooper (MRN [id number]) as of 12/24/2014 11:50  Ref. Range 12/23/2014 07:41 12/23/2014 11:59 12/23/2014 13:52 12/23/2014 16:44 12/23/2014 21:26 12/24/2014 07:41 12/24/2014 11:33  Glucose-Capillary Latest Ref Range: 65-99 mg/dL 205 (H) 156 (H) 137 (H) 233 (H) 308 (H) 249 (H) 249 (H)    Current orders for Inpatient glycemic control: Novolog 0-15 units TID with meals  Inpatient Diabetes Program Recommendations Correction (SSI): Please consider ordering Novolog bedtime correction scale. Insulin - Meal Coverage: Please consider ordering Novolog 3 units TID with meals for meal coverage if patient eats at least 50% of meals since post prandial glucose is consistently elevated.  Note: In reviewing the chart, noted patient received a one time dose of Decadron 10 mg on 7/25 at 13:09 which is contributing to hyperglycemia.  Thanks, Barnie Alderman, RN, MSN, CCRN, CDE Diabetes Coordinator Inpatient Diabetes Program [phone] (Team Pager from [place] to [place]) [phone] ([place] office) [phone] [facility] office) [phone] [facility] office)

## 2014-12-24 NOTE — Consult Note (Signed)
WOC ostomy follow up Stoma type/location: LLQ Colostomy Stomal assessment/size: 1 1/2" oval slightly budded Peristomal assessment: Not assessed.  Pouch was changed earlier.   Treatment options for stomal/peristomal skin: Barrier ring used for creasing.  Output Soft brown stool Ostomy pouching: 2pc. 2 1'4" with barrier ring Education provided: No family at bedside.  Would like to coordiante one more pouch change with family present.  Patient to be discharged to Rehab facility.   Enrolled patient in [place] Start Discharge program: No  Surgery is following for perineal abscess.  Please consult if nursing assistance needed for [facility] dressing changes post operatively.  Will not follow at this time.  Please re-consult if needed.  Domenic Moras RN BSN [place] Pager [phone]

## 2014-12-24 NOTE — Progress Notes (Signed)
PT Cancellation Note  Patient Details Name: Darryl Hooper. MRN: [id number] DOB: 03-01-1930   Cancelled Treatment:    Reason Eval/Treat Not Completed: Other (comment) (See PT note for further details) Due to I & D procedure performed yesterday, new PT orders will need to be issued by MD for PT to resume. Once new orders are acquired, PT will continue as soon as possible.    Janyth Contes 12/24/2014, 12:32 PM  Janyth Contes, SPT. [phone]

## 2014-12-24 NOTE — Care Management (Signed)
Discussed patient at quality collaboratives meeting today and with attending physician. Patient for surgical procedure tomorrow. Wound VAC placement.

## 2014-12-24 NOTE — Progress Notes (Signed)
Surgery Progress Note  S: Doing well.  No acute issues O:Blood pressure 160/61, pulse 56, temperature 98.4 F (36.9 C), temperature source Oral, resp. rate 18, height 6\' 2"  (1.88 m), weight 116.937 kg (257 lb 12.8 oz), SpO2 97 %. GEN: NAD/A&Ox3 ABD: wound c/d/i Perineum: dressing with betadyne  A/P 81 yo s/p debridement of perineal wound - to OR tomorrow for further debridement and vac placement

## 2014-12-24 NOTE — Clinical Documentation Improvement (Signed)
12/23/14 Operative Note.Marland KitchenMarland Kitchen"The perineum was debrided of any necrotic tissue to healthy bleeding tissue.".Marland KitchenMarland Kitchen  Please clarify documentation in the medical record of "debridement." Thank you  Please document the below (4) key elements in a progress note: ? Type of instrument used? ? Depth of debridement/type of tissue removed? (e.g., skin, subcutaneous tissue, fascia, muscle, bone, other) ? Did the debridement extend outside or beyond the wound margins? ? Excisional vs. nonexcisional? ? Irrigation of wound (e.g, Versajet)? ? Location of wound? ? Document the size of the debridement.    Thank You, Ermelinda Das, RN, BSN, CCDS Certified Clinical Documentation Specialist Pager: [place]: Health Information Management

## 2014-12-24 NOTE — Anesthesia Preprocedure Evaluation (Deleted)
Anesthesia Evaluation    Airway        Dental   Pulmonary PE         Cardiovascular hypertension, + Peripheral Vascular Disease     Neuro/Psych    GI/Hepatic GERD-  ,  Endo/Other  diabetes, Well Controlled  Renal/GU      Musculoskeletal  (+) Arthritis -,   Abdominal   Peds  Hematology   Anesthesia Other Findings Prostate cancer IBS  Reproductive/Obstetrics                            Anesthesia Physical Anesthesia Plan Anesthesia Quick Evaluation

## 2014-12-25 ENCOUNTER — Encounter
Admission: RE | Disposition: A | Discharge status: Skilled Nursing Facility | Payer: Self-pay | Source: Ambulatory Visit | Attending: Surgery

## 2014-12-25 ENCOUNTER — Inpatient Hospital Stay: Payer: Medicare Other | Admitting: Anesthesiology

## 2014-12-25 ENCOUNTER — Encounter: Payer: Self-pay | Admitting: Anesthesiology

## 2014-12-25 DIAGNOSIS — L02215 Cutaneous abscess of perineum: Secondary | ICD-10-CM

## 2014-12-25 HISTORY — PX: APPLICATION OF WOUND VAC: SHX5189

## 2014-12-25 HISTORY — PX: IRRIGATION AND DEBRIDEMENT ABSCESS: SHX5252

## 2014-12-25 LAB — GLUCOSE, CAPILLARY
Glucose-Capillary: 175 mg/dL — ABNORMAL HIGH (ref 65–99)
Glucose-Capillary: 140 mg/dL — ABNORMAL HIGH (ref 65–99)
Glucose-Capillary: 193 mg/dL — ABNORMAL HIGH (ref 65–99)

## 2014-12-25 SURGERY — IRRIGATION AND DEBRIDEMENT ABSCESS
Anesthesia: General | Site: Rectum | Wound class: Dirty or Infected

## 2014-12-25 MED ORDER — PROPOFOL 10 MG/ML IV BOLUS
INTRAVENOUS | Status: DC | PRN
  Administered 2014-12-25: 100 mg via INTRAVENOUS

## 2014-12-25 MED ORDER — FENTANYL CITRATE (PF) 100 MCG/2ML IJ SOLN
25.0000 ug | INTRAMUSCULAR | Status: DC | PRN
  Administered 2014-12-25 (×2): 25 ug via INTRAVENOUS
  Administered 2014-12-30: 50 ug via INTRAVENOUS

## 2014-12-25 MED ORDER — LIDOCAINE HCL (CARDIAC) 20 MG/ML IV SOLN
INTRAVENOUS | Status: DC | PRN
  Administered 2014-12-25: 30 mg via INTRAVENOUS

## 2014-12-25 MED ORDER — FENTANYL CITRATE (PF) 100 MCG/2ML IJ SOLN
INTRAMUSCULAR | Status: DC | PRN
Start: 2014-12-25 — End: 2014-12-25
  Administered 2014-12-25 (×2): 25 ug via INTRAVENOUS

## 2014-12-25 MED ORDER — EPHEDRINE SULFATE 50 MG/ML IJ SOLN
INTRAMUSCULAR | Status: DC | PRN
  Administered 2014-12-25: 10 mg via INTRAVENOUS

## 2014-12-25 MED ORDER — OXYCODONE HCL 5 MG PO TABS: 5.0000 mg | ORAL_TABLET | Freq: Once | ORAL | Status: AC | PRN

## 2014-12-25 MED ORDER — OXYCODONE HCL 5 MG/5ML PO SOLN: 5.0000 mg | Freq: Once | ORAL | Status: AC | PRN

## 2014-12-25 MED ORDER — HYDRALAZINE HCL 20 MG/ML IJ SOLN
10.0000 mg | Freq: Four times a day (QID) | INTRAMUSCULAR | Status: DC | PRN
Start: 2014-12-25 — End: 2014-12-31
  Filled 2014-12-25: qty 1

## 2014-12-25 SURGICAL SUPPLY — 32 items
BLADE SURG SZ11 CARB STEEL (BLADE) ×3 IMPLANT
BNDG GAUZE 4.5X4.1 6PLY STRL (MISCELLANEOUS) ×3 IMPLANT
BRIEF STRETCH MATERNITY 2XLG (MISCELLANEOUS) ×6 IMPLANT
CANISTER SUCT 1200ML W/VALVE (MISCELLANEOUS) ×3 IMPLANT
DRAIN PENROSE 1/4X12 LTX (DRAIN) IMPLANT
DRAIN PENROSE 5/8X12 LTX STRL (DRAIN) IMPLANT
DRAPE LAPAROTOMY 100X77 ABD (DRAPES) ×3 IMPLANT
DRAPE LEGGINS SURG 28X43 STRL (DRAPES) ×3 IMPLANT
DRAPE UNDER BUTTOCK W/FLU (DRAPES) ×3 IMPLANT
DRSG VAC ATS MED SENSATRAC (GAUZE/BANDAGES/DRESSINGS) ×3 IMPLANT
GAUZE SPONGE 4X4 12PLY STRL (GAUZE/BANDAGES/DRESSINGS) ×3 IMPLANT
GLOVE BIO SURGEON STRL SZ7.5 (GLOVE) ×3 IMPLANT
GLOVE INDICATOR 7.5 STRL GRN (GLOVE) ×3 IMPLANT
GLOVE INDICATOR 8.0 STRL GRN (GLOVE) ×3 IMPLANT
GLOVE SURG LX 7.5 STRW (GLOVE) ×2
GLOVE SURG LX STRL 7.5 STRW (GLOVE) ×1 IMPLANT
GOWN STRL REUS W/ TWL LRG LVL3 (GOWN DISPOSABLE) ×2 IMPLANT
GOWN STRL REUS W/TWL LRG LVL3 (GOWN DISPOSABLE) ×4
KIT RM TURNOVER CYSTO AR (KITS) ×3 IMPLANT
KIT RM TURNOVER STRD PROC AR (KITS) ×3 IMPLANT
LABEL OR SOLS (LABEL) IMPLANT
NS IRRIG 1000ML POUR BTL (IV SOLUTION) ×3 IMPLANT
NS IRRIG 500ML POUR BTL (IV SOLUTION) ×3 IMPLANT
PACK BASIN MINOR ARMC (MISCELLANEOUS) ×3 IMPLANT
PAD ABD DERMACEA PRESS 5X9 (GAUZE/BANDAGES/DRESSINGS) ×6 IMPLANT
PAD PREP 24X41 OB/GYN DISP (PERSONAL CARE ITEMS) ×3 IMPLANT
SOL PREP PVP 2OZ (MISCELLANEOUS) ×6
SOLUTION PREP PVP 2OZ (MISCELLANEOUS) ×2 IMPLANT
SPONGE XRAY 4X4 16PLY STRL (MISCELLANEOUS) ×3 IMPLANT
SWAB CULTURE AMIES ANAERIB BLU (MISCELLANEOUS) IMPLANT
SYR BULB IRRIG 60ML STRL (SYRINGE) ×3 IMPLANT
WND VAC CANISTER 500ML (MISCELLANEOUS) ×3 IMPLANT

## 2014-12-25 NOTE — Anesthesia Procedure Notes (Signed)
Procedure Name: LMA Insertion Date/Time: 12/25/2014 3:00 PM Performed by: Allean Found Pre-anesthesia Checklist: Patient identified, Emergency Drugs available, Suction available, Patient being monitored and Timeout performed Patient Re-evaluated:Patient Re-evaluated prior to inductionOxygen Delivery Method: Circle system utilized Preoxygenation: Pre-oxygenation with 100% oxygen Intubation Type: IV induction LMA: LMA inserted LMA Size: 4.0 Number of attempts: 1

## 2014-12-25 NOTE — Transfer of Care (Signed)
Immediate Anesthesia Transfer of Care Note  Patient: Darryl Hooper.  Procedure(s) Performed: Procedure(s): IRRIGATION AND DEBRIDEMENT ABSCESS (N/A) APPLICATION OF WOUND VAC (N/A)  Patient Location: PACU  Anesthesia Type:General  Level of Consciousness: awake  Airway & Oxygen Therapy: Patient Spontanous Breathing and Patient connected to face mask oxygen  Post-op Assessment: Report given to RN and Post -op Vital signs reviewed and stable  Post vital signs: Reviewed and stable  Last Vitals:  Filed Vitals:   12/25/14 1545  BP: 151/64  Pulse: 59  Temp: 36.8 C  Resp: 15    Complications: No apparent anesthesia complications

## 2014-12-25 NOTE — Anesthesia Preprocedure Evaluation (Addendum)
Anesthesia Evaluation  Patient identified by MRN, date of birth, ID band Patient awake    Reviewed: Allergy & Precautions, H&P , NPO status , Patient's Chart, lab work & pertinent test results, reviewed documented beta blocker date and time   History of Anesthesia Complications Negative for: history of anesthetic complications  Airway Mallampati: III  TM Distance: >3 FB Neck ROM: limited    Dental  (+) Chipped, Poor Dentition   Pulmonary neg pulmonary ROS,  breath sounds clear to auscultation        Cardiovascular hypertension, + Peripheral Vascular Disease Normal cardiovascular examRate:Normal     Neuro/Psych  Neuromuscular disease    GI/Hepatic Neg liver ROS, GERD-  ,  Endo/Other  diabetes, Type 2  Renal/GU Renal disease     Musculoskeletal  (+) Arthritis -,   Abdominal   Peds  Hematology   Anesthesia Other Findings Past Medical History:   Hypertension                                                 Peripheral vascular disease                                  Hyperlipidemia                                               Diarrhea                                                     Diabetes mellitus without complication                       BPH (benign prostatic hyperplasia)                           Prostate cancer                                              Arthritis                                                    Knee pain, bilateral                                         Rectal cancer                                                Chronic kidney disease  Comment:incontience   IBS (irritable bowel syndrome)                               Neuropathy due to secondary diabetes mellitus                Hx of pulmonary embolus                                        Comment:2011--treated with Coumadin   Reproductive/Obstetrics                             Anesthesia Physical  Anesthesia Plan  ASA: III  Anesthesia Plan: General LMA   Post-op Pain Management:    Induction:   Airway Management Planned:   Additional Equipment:   Intra-op Plan:   Post-operative Plan:   Informed Consent: I have reviewed the patients History and Physical, chart, labs and discussed the procedure including the risks, benefits and alternatives for the proposed anesthesia with the patient or authorized representative who has indicated his/her understanding and acceptance.   Dental Advisory Given  Plan Discussed with: CRNA, Anesthesiologist and Surgeon  Anesthesia Plan Comments:        Anesthesia Quick Evaluation

## 2014-12-25 NOTE — Progress Notes (Signed)
Surgery Progress Note  S: No acute issues. O: Blood pressure 153/63, pulse 56, temperature 98.4 F (36.9 C), temperature source Oral, resp. rate 16, height 6\' 2"  (1.88 m), weight 112.22 kg (247 lb 6.4 oz), SpO2 97 %. GEN: NAD/A&Ox3 Perineum: dressing with betadyne  A/P 81 yo s/p APR, now with perineal infection - to OR today for further debridement and vac

## 2014-12-25 NOTE — Progress Notes (Signed)
Inpatient Diabetes Program Recommendations  AACE/ADA: New Consensus Statement on Inpatient Glycemic Control (2013)  Target Ranges:  Prepandial:   less than 140 mg/dL      Peak postprandial:   less than 180 mg/dL (1-2 hours)      Critically ill patients:  140 - 180 mg/dL   Results for Darryl Hooper, Darryl Hooper (MRN [id number]) as of 12/25/2014 10:44  Ref. Range 12/24/2014 07:41 12/24/2014 11:33 12/24/2014 16:45 12/24/2014 21:30 12/25/2014 07:42  Glucose-Capillary Latest Ref Range: 65-99 mg/dL 249 (H) 249 (H) 209 (H) 224 (H) 175 (H)    Current orders for Inpatient glycemic control: Novolog 0-15 units TID with meals  Inpatient Diabetes Program Recommendations Correction (SSI): Please consider ordering Novolog bedtime correction scale. Insulin - Meal Coverage: When diet is resumed after surgery, please consider ordering Novolog 3 units TID with meals for meal coverage if patient eats at least 50% of meals since post prandial glucose is consistently elevated.  Note: In reviewing the chart, noted patient received a one time dose of Decadron 10 mg on 7/25 at 13:09 which is contributing to hyperglycemia. Glucose ranged from 209-249 mg/dl on 12/24/14 and fasting glucose was 175 mg/dl this morning. Patient is currently NPO since he will go back to OR today.  Thanks, Barnie Alderman, RN, MSN, CCRN, CDE Diabetes Coordinator Inpatient Diabetes Program [phone] (Team Pager from [place] to [place]) [phone] ([place] office) [phone] [facility] office) [phone] [facility] office)

## 2014-12-25 NOTE — Anesthesia Postprocedure Evaluation (Signed)
  Anesthesia Post-op Note  Patient: Darryl Hooper.  Procedure(s) Performed: Procedure(s): IRRIGATION AND DEBRIDEMENT ABSCESS (N/A) APPLICATION OF WOUND VAC (N/A)  Anesthesia type:General LMA  Patient location: PACU  Post pain: Pain level controlled  Post assessment: Post-op Vital signs reviewed, Patient's Cardiovascular Status Stable, Respiratory Function Stable, Patent Airway and No signs of Nausea or vomiting  Post vital signs: Reviewed and stable  Last Vitals:  Filed Vitals:   12/25/14 1625  BP:   Pulse:   Temp: 37.2 C  Resp:     Level of consciousness: awake, alert  and patient cooperative  Complications: No apparent anesthesia complications

## 2014-12-25 NOTE — Brief Op Note (Signed)
12/12/2014 - 12/25/2014  3:40 PM  PATIENT:  Darryl Hooper.  81 y.o. male  PRE-OPERATIVE DIAGNOSIS:  PERINEAL abscess  POST-OPERATIVE DIAGNOSIS:  PERINEAL abscess  PROCEDURE:  Debridement of perineal wound 8 x 3 x 5 cm  SURGEON:  Surgeon(s) and Role:    * Marlyce Huge, MD - Primary  PHYSICIAN ASSISTANT:   ASSISTANTS: none   ANESTHESIA:   general  EBL:  Total I/O In: -  Out: 950 [Urine:950]  BLOOD ADMINISTERED:none  DRAINS: none   LOCAL MEDICATIONS USED:  LIDOCAINE   SPECIMEN:  Excision  DISPOSITION OF SPECIMEN:  N/A  COUNTS:  YES  TOURNIQUET:  * No tourniquets in log *  DICTATION: .Note written in EPIC  PLAN OF CARE: Admit to inpatient   PATIENT DISPOSITION:  PACU - hemodynamically stable.   Delay start of Pharmacological VTE agent (>24hrs) due to surgical blood loss or risk of bleeding: not applicable

## 2014-12-25 NOTE — Care Management Important Message (Signed)
Important Message  Patient Details  Name: Darryl Hooper. MRN: [id number] Date of Birth: [birth year]   Medicare Important Message Given:  Yes-second notification given    [name] 12/25/2014, 11:40 AM

## 2014-12-25 NOTE — Consult Note (Signed)
WOC ostomy follow up Stoma type/location: LLQ Colostomy, viable pink and moist.  Slightly budded. Creasing noted at 3 and 9 o'clock.  Stomal assessment/size: 1 1/2" oval.  Barrier ring used Peristomal assessment: Intact.  Skin creasing noted.  Treatment options for stomal/peristomal skin: Barrier ring. Patient to OR today for debridement and NPWT (VAC) for perineal wound.   Output Soft brown stool Ostomy pouching: 2pc. 2 1/4" pouch with barrier ring Education provided: Pouch change performed today.  Patient observed and asked questions regarding frequency of pouch change and emptying.  Enrolled patient in [place] Start Discharge program: No WOC team will continue to monitor and remain available to patient, medical and nursing teams.  Domenic Moras RN BSN [place] Pager [phone]

## 2014-12-25 NOTE — Op Note (Signed)
Preoperative diagnosis: Perineal wound dehiscence with purulence and infection Postoperative diagnosis: Same Procedure performed: Debridemen of perineal wound 8 x 3 x 5 cm  Surgeon: Aarian Griffie EBL: 8ml Anesthesia: LMA, general Specimen: None Complications: None  Indication for surgery: Mr. Sokolowski is a pleasant 81 yo M who had recently underwent APR. He developed a large draining perineal wound with significant necrosis which required operative debridement.  He is returning for further wound debridement and evaluation for possible wound vac placement  Details of procedure: Informed consent was obtained. Mr. Burdo was brought to the or suite and laid supine on the OR table. He was induced, LMA placed and general anesthesia was administered. His legs were placed in stirrups to allow access to the perineum. His perineum was then prepped and draped. A timeout was then performed correctly identifying patient name, operative site and procedure to be performed. I then proceded to examine his perineum. There was purulence which was expressed when the dressing was taken down.  Compared to last surgery, his wound was much less necrotic but still had some significant areas of necrosis, mostly posterior. The perineum was debrided of any necrotic tissue to healthy bleeding tissue. The wound was then packed with betadyne soaked gauze. A sterile dressing was placed over the wound. The patient was then awoken, LMA was removed and he was brought to PACU. There were no immediate complications. The needle, sponge and instrument count was correct at the end of the procedure.

## 2014-12-26 LAB — COMPREHENSIVE METABOLIC PANEL
ALT: 33 U/L (ref 17–63)
AST: 27 U/L (ref 15–41)
Albumin: 1.9 g/dL — ABNORMAL LOW (ref 3.5–5.0)
Alkaline Phosphatase: 50 U/L (ref 38–126)
Anion gap: 6 (ref 5–15)
BILIRUBIN TOTAL: 0.3 mg/dL (ref 0.3–1.2)
BUN: 28 mg/dL — ABNORMAL HIGH (ref 6–20)
CALCIUM: 7.6 mg/dL — AB (ref 8.9–10.3)
CO2: 22 mmol/L (ref 22–32)
Chloride: 104 mmol/L (ref 101–111)
Creatinine, Ser: 2.09 mg/dL — ABNORMAL HIGH (ref 0.61–1.24)
GFR calc Af Amer: 32 mL/min — ABNORMAL LOW (ref >60)
GFR calc non Af Amer: 28 mL/min — ABNORMAL LOW (ref >60)
Glucose, Bld: 209 mg/dL — ABNORMAL HIGH (ref 65–99)
POTASSIUM: 4.5 mmol/L (ref 3.5–5.1)
Sodium: 132 mmol/L — ABNORMAL LOW (ref 135–145)
Total Protein: 4.7 g/dL — ABNORMAL LOW (ref 6.5–8.1)

## 2014-12-26 LAB — CBC WITH DIFFERENTIAL/PLATELET
Basophils Absolute: 0 10*3/uL (ref 0–0.1)
Basophils Relative: 0 %
EOS ABS: 0.2 10*3/uL (ref 0–0.7)
Eosinophils Relative: 2 %
HEMATOCRIT: 29 % — AB (ref 40.0–52.0)
HEMOGLOBIN: 9.7 g/dL — AB (ref 13.0–18.0)
Lymphocytes Relative: 6 %
Lymphs Abs: 0.6 10*3/uL — ABNORMAL LOW (ref 1.0–3.6)
MCH: 29.7 pg (ref 26.0–34.0)
MCHC: 33.4 g/dL (ref 32.0–36.0)
MCV: 88.9 fL (ref 80.0–100.0)
MONOS PCT: 6 %
Monocytes Absolute: 0.6 10*3/uL (ref 0.2–1.0)
Neutro Abs: 8.7 10*3/uL — ABNORMAL HIGH (ref 1.4–6.5)
Neutrophils Relative %: 86 %
Platelets: 283 10*3/uL (ref 150–440)
RBC: 3.26 MIL/uL — ABNORMAL LOW (ref 4.40–5.90)
RDW: 14.2 % (ref 11.5–14.5)
WBC: 10.2 10*3/uL (ref 3.8–10.6)

## 2014-12-26 LAB — GLUCOSE, CAPILLARY
GLUCOSE-CAPILLARY: 115 mg/dL — AB (ref 65–99)
GLUCOSE-CAPILLARY: 158 mg/dL — AB (ref 65–99)
GLUCOSE-CAPILLARY: 214 mg/dL — AB (ref 65–99)
Glucose-Capillary: 262 mg/dL — ABNORMAL HIGH (ref 65–99)
Glucose-Capillary: 169 mg/dL — ABNORMAL HIGH (ref 65–99)

## 2014-12-26 NOTE — Progress Notes (Signed)
Wound changed at bedside.  No obvious purulence, good bleeding tissue at visualization.  Will cont wet to dry x 1 more day then likely place wound vac.

## 2014-12-26 NOTE — Progress Notes (Signed)
Surgery Progress Note  S: Doing well today, more alert O:Blood pressure 158/76, pulse 58, temperature 98.5 F (36.9 C), temperature source Oral, resp. rate 17, height 6\' 2"  (1.88 m), weight 114.2 kg (251 lb 12.3 oz), SpO2 98 %. GEN: NAD/A&Ox3 ABD: soft, min tender, nondistended, incision with small opening with good granulation  WBC 10.2  A/P 81 yo s/p APR, s/p perineal wound debridement x 2 - wet to dry bid - possible vac soon if looks good

## 2014-12-27 DIAGNOSIS — T8189XA Other complications of procedures, not elsewhere classified, initial encounter: Secondary | ICD-10-CM

## 2014-12-27 DIAGNOSIS — I96 Gangrene, not elsewhere classified: Secondary | ICD-10-CM

## 2014-12-27 LAB — SURGICAL PCR SCREEN
MRSA, PCR: NEGATIVE
Staphylococcus aureus: NEGATIVE

## 2014-12-27 LAB — GLUCOSE, CAPILLARY
Glucose-Capillary: 160 mg/dL — ABNORMAL HIGH (ref 65–99)
Glucose-Capillary: 284 mg/dL — ABNORMAL HIGH (ref 65–99)
Glucose-Capillary: 227 mg/dL — ABNORMAL HIGH (ref 65–99)
Glucose-Capillary: 184 mg/dL — ABNORMAL HIGH (ref 65–99)

## 2014-12-27 NOTE — Consult Note (Signed)
WOC ostomy follow up Stoma type/location: LLQ Colostomy pouch intact.  Changed yesterday.  Arrangements made to meet with family MOnday for pouch change and education.  Treatment options for stomal/peristomal skin: Barrier ring for creasing Output Brown soft stool Ostomy pouching: 2pc 2 1/4".  Education provided: Patient demonstrates emptying pouch and states he feels comfortable with that. Pouch change education ongoing.  Surgeon to apply NPWT VAC dressing to perineal surgical wound today.  Enrolled patient in [place] Start Discharge program: Yes [place] team will continue to follow.  Domenic Moras RN BSN [place] Pager [phone]

## 2014-12-27 NOTE — Progress Notes (Signed)
Nutrition Follow-up  INTERVENTION:   Meals and Snacks: Cater to patient preferences Education: RD provided "Colostomy Nutrition Therapy" handout from the Academy of [employer] again to pt as pt reports not remembering if he had education previously. Discussed nutrition therapy to reduce the irritation of the gastrointestinal tract to promote healing. Provided list of recommended low fiberous foods in comparison to foods with high amounts of fiber, as well as a recommended sample day. Education also gives recommendations for foods when symptoms arise. Recommended staying hydrated and eating small meals more frequently to aid in digestion.  Teach back method used.  Expect good compliance.  NUTRITION DIAGNOSIS:   Inadequate oral intake related to altered GI function as evidenced by  (NPO/CL day 5) improved with diet advancement  GOAL:   Patient will meet greater than or equal to 90% of their needs; ongoing  MONITOR:    (Energy intake, digestive system)  ASSESSMENT:   Pt s/p parital colon resection, colostomy present and functional. Pt s/p I&D pf perineal abscess.  Diet Order:  Diet regular Room service appropriate?: Yes; Fluid consistency:: Thin    Current Nutrition: Pt reports eating eggs, pancakes, and bacon this am at breakfast with a good appetite. Recorded po intake 75-100% of meals for the past 5 days.  Gastrointestinal Profile: Last BM: 12/27/2014 via colostomy   Medications: novolog, KCl, NS at 19mL/hr  Electrolyte/Renal Profile and Glucose Profile:   Recent Labs Lab 12/21/14 0715 12/23/14 0537 12/26/14 0958  NA 136 135 132*  K 3.1* 3.4* 4.5  CL 110 105 104  CO2 20* 21* 22  BUN 26* 27* 28*  CREATININE 1.90* 1.96* 2.09*  CALCIUM 7.4* 7.5* 7.6*  GLUCOSE 172* 183* 209*   Protein Profile:   Recent Labs Lab 12/26/14 0958  ALBUMIN 1.9*     Weight Trend since Admission: Filed Weights   12/25/14 0221 12/26/14 0500 12/27/14 0557  Weight: 247 lb  6.4 oz (112.22 kg) 251 lb 12.3 oz (114.2 kg) 241 lb 3.2 oz (109.408 kg)     Skin:  Reviewed, no issues   BMI:  Body mass index is 30.96 kg/(m^2).  Estimated Nutritional Needs:   Kcal:  Using IBW of 86 kg BEE 1624 kcals (IF 1.0-1.2, AF 1.2) 4665-9935 kcals/d.   Protein:  Using IBW of 86kg (1.2-1.5 g/kg) 103-129 g/d  Fluid:  Using IBW of 86kg (25-46ml/kg) 2150-2555ml/d  EDUCATION NEEDS:   Education needs addressed  [place], RD, LDN Pager [phone]

## 2014-12-27 NOTE — Clinical Social Work Note (Signed)
Patient to receive wound vac today and CSW has updated Broadus John at [facility]. Joseph at [facility] that they have the wound vac at their facility and can take over the weekend if needed. Shela Leff MSW,LCSW [phone]

## 2014-12-27 NOTE — Progress Notes (Signed)
Inpatient Diabetes Program Recommendations  AACE/ADA: New Consensus Statement on Inpatient Glycemic Control (2013)  Target Ranges:  Prepandial:   less than 140 mg/dL      Peak postprandial:   less than 180 mg/dL (1-2 hours)      Critically ill patients:  140 - 180 mg/dL   Results for ELCHANAN, BOB (MRN [id number]) as of 12/27/2014 09:49  Ref. Range 12/26/2014 07:37 12/26/2014 11:23 12/26/2014 16:20 12/26/2014 21:57 12/27/2014 07:53  Glucose-Capillary Latest Ref Range: 65-99 mg/dL 158 (H) 262 (H) 169 (H) 214 (H) 160 (H)   Current orders for Inpatient glycemic control: Novolog 0-15 units TID with meals  Inpatient Diabetes Program Recommendations Correction (SSI): Please consider ordeirng Novolog bedtime correction scale. Insulin - Meal Coverage: Please consider ordering Novolog 4 units TID with meals for meal coverage if patient eats at least 50% of meals. Diet: Please consider changing diet from Regular to Carb Modified diet.  Thanks, Barnie Alderman, RN, MSN, CCRN, CDE Diabetes Coordinator Inpatient Diabetes Program [phone] (Team Pager from [place] to [place]) [phone] ([place] office) [phone] [facility] office) [phone] [facility] office)

## 2014-12-27 NOTE — Care Management Important Message (Signed)
Important Message  Patient Details  Name: Darryl Hooper. MRN: [id number] Date of Birth: [birth year]   Medicare Important Message Given:  Yes-second notification given    Marshell Garfinkel, RN 12/27/2014, 7:57 AM

## 2014-12-27 NOTE — Progress Notes (Signed)
Surgery Progress Note  S: No acute issues.   O: Blood pressure 157/63, pulse 63, temperature 98.7 F (37.1 C), temperature source Oral, resp. rate 20, height 6\' 2"  (1.88 m), weight 109.408 kg (241 lb 3.2 oz), SpO2 95 %. GEN: NAD/A&Ox3 ABD: soft, min tender, nondistended, incision c/d/i Perineum: wound with healthier, bleeding tissue, min purulence, some grungy tissue over sacrum  A/P 81 yo s/p APR with wound infection, doing well - wound vac - change in 2 days - if looks good possibly to rehab soon

## 2014-12-28 LAB — GLUCOSE, CAPILLARY
GLUCOSE-CAPILLARY: 241 mg/dL — AB (ref 65–99)
GLUCOSE-CAPILLARY: 217 mg/dL — AB (ref 65–99)
Glucose-Capillary: 166 mg/dL — ABNORMAL HIGH (ref 65–99)
Glucose-Capillary: 192 mg/dL — ABNORMAL HIGH (ref 65–99)

## 2014-12-28 NOTE — Progress Notes (Signed)
Surgery Progress Note  S: No acute issues O: Blood pressure 166/60, pulse 72, temperature 98.9 F (37.2 C), temperature source Oral, resp. rate 18, height 6\' 2"  (1.88 m), weight 111.494 kg (245 lb 12.8 oz), SpO2 98 %. GEN: NAD/A&Ox3 ABD: soft, nt, nd, incision c/d/i PERINEUM: Vac with good seal  A/P 81 yo s/p APR s/p debridement of perineal wound with vac - vac change tomorrow - nutrition parameters tomorrow

## 2014-12-29 LAB — COMPREHENSIVE METABOLIC PANEL
ALT: 25 U/L (ref 17–63)
AST: 25 U/L (ref 15–41)
Albumin: 1.9 g/dL — ABNORMAL LOW (ref 3.5–5.0)
Alkaline Phosphatase: 42 U/L (ref 38–126)
Anion gap: 9 (ref 5–15)
BUN: 28 mg/dL — ABNORMAL HIGH (ref 6–20)
CHLORIDE: 105 mmol/L (ref 101–111)
CO2: 19 mmol/L — ABNORMAL LOW (ref 22–32)
Calcium: 7.8 mg/dL — ABNORMAL LOW (ref 8.9–10.3)
Creatinine, Ser: 2.08 mg/dL — ABNORMAL HIGH (ref 0.61–1.24)
GFR, EST AFRICAN AMERICAN: 32 mL/min — AB (ref >60)
GFR, EST NON AFRICAN AMERICAN: 28 mL/min — AB (ref >60)
Glucose, Bld: 165 mg/dL — ABNORMAL HIGH (ref 65–99)
POTASSIUM: 4.8 mmol/L (ref 3.5–5.1)
SODIUM: 133 mmol/L — AB (ref 135–145)
Total Bilirubin: 0.3 mg/dL (ref 0.3–1.2)
Total Protein: 4.8 g/dL — ABNORMAL LOW (ref 6.5–8.1)

## 2014-12-29 LAB — GLUCOSE, CAPILLARY
GLUCOSE-CAPILLARY: 198 mg/dL — AB (ref 65–99)
Glucose-Capillary: 152 mg/dL — ABNORMAL HIGH (ref 65–99)
Glucose-Capillary: 241 mg/dL — ABNORMAL HIGH (ref 65–99)
Glucose-Capillary: 251 mg/dL — ABNORMAL HIGH (ref 65–99)

## 2014-12-29 LAB — PREALBUMIN: Prealbumin: 14.6 mg/dL — ABNORMAL LOW (ref 18–38)

## 2014-12-29 LAB — TRANSFERRIN: TRANSFERRIN: 166 mg/dL — AB (ref 180–329)

## 2014-12-29 MED ORDER — ENSURE ENLIVE PO LIQD
237.0000 mL | Freq: Three times a day (TID) | ORAL | Status: DC
  Administered 2014-12-29 – 2014-12-31 (×6): 237 mL via ORAL

## 2014-12-29 NOTE — Progress Notes (Signed)
Physical Therapy Evaluation Patient Details Name: Darryl Hooper. MRN: [id number] DOB: 1929-08-24 Today's Date: 12/29/2014   History of Present Illness  Pt is an 81 yo male admitted to the hospital s/p APR surgery for rectal cancer.   Clinical Impression  Patient is a pleasant male who demonstrates significant loss of strength and function since being admitted on 14 July. Patient plans on going to [facility] for rehabilitation upon discharge but will benefit from continued strength/balance/mobility progressions during his stay at the hospital to prevent further deconditioning.    Follow Up Recommendations SNF    Equipment Recommendations  None recommended by PT    Recommendations for Other Services       Precautions / Restrictions Precautions Precautions: Fall Restrictions Weight Bearing Restrictions: No      Mobility  Bed Mobility Overal bed mobility: Needs Assistance Bed Mobility: Supine to Sit;Sit to Supine     Supine to sit: Max assist Sit to supine: Max assist   General bed mobility comments: Patient required maximal assistance as well as increased time for rest breaks to move from supine to sit and sit to supine. +2 assist due to inability to scoot or bridge through LEs.  Transfers Overall transfer level: Needs assistance Equipment used: Rolling walker (2 wheeled) Transfers: Sit to/from Stand Sit to Stand: +2 physical assistance;From elevated surface         General transfer comment: Sit to stand was attempted with maximal and +2 assistance. Due to decreased strength, patient was unable to extend knees/hips to obtain erect posture.   Ambulation/Gait                Stairs            Wheelchair Mobility    Modified Rankin (Stroke Patients Only)       Balance Overall balance assessment: Needs assistance;History of Falls Sitting-balance support: Bilateral upper extremity supported;Feet supported Sitting balance-Leahy Scale:  Fair Sitting balance - Comments: Patient able to perform weight shifting and reaching with B LE/1 UE supported     Standing balance-Leahy Scale: Zero Standing balance comment: Unable to assess                             Pertinent Vitals/Pain Pain Assessment: 0-10 Pain Score: 4  Pain Location: L knee Pain Descriptors / Indicators: Aching Pain Intervention(s): Limited activity within patient's tolerance;Monitored during session    Home Living Family/patient expects to be discharged to:: Skilled nursing facility Living Arrangements: Spouse/significant other Available Help at Discharge: Family Type of Home: House Home Access: Stairs to enter Entrance Stairs-Rails: Left Entrance Stairs-Number of Steps: 4 Home Layout: One level Home Equipment: Cane - single point      Prior Function Level of Independence: Independent with assistive device(s)               Hand Dominance        Extremity/Trunk Assessment   Upper Extremity Assessment: Generalized weakness (Grossly 4-/5)           Lower Extremity Assessment: Generalized weakness;LLE deficits/detail (Grossly 3/5)   LLE Deficits / Details: Decreased L knee A/PROM, empty end feel with pain     Communication   Communication: No difficulties  Cognition Arousal/Alertness: Awake/alert Behavior During Therapy: WFL for tasks assessed/performed Overall Cognitive Status: Within Functional Limits for tasks assessed  General Comments      Exercises Total Joint Exercises Ankle Circles/Pumps: Strengthening;15 reps;Seated General Exercises - Lower Extremity Long Arc Quad: AROM;10 reps;Seated Hip Flexion/Marching: AROM;10 reps;Seated Other Exercises Other Exercises: Dynamic reaching in sitting x5 B      Assessment/Plan    PT Assessment Patient needs continued PT services  PT Diagnosis Difficulty walking;Generalized weakness   PT Problem List Decreased strength;Decreased  range of motion;Decreased activity tolerance;Decreased balance;Decreased mobility;Decreased knowledge of use of DME;Decreased skin integrity;Pain  PT Treatment Interventions DME instruction;Gait training;Stair training;Functional mobility training;Therapeutic activities;Therapeutic exercise;Balance training;Neuromuscular re-education;Patient/family education   PT Goals (Current goals can be found in the Care Plan section) Acute Rehab PT Goals Patient Stated Goal: "To get stronger" PT Goal Formulation: With patient Time For Goal Achievement: 01/12/15 Potential to Achieve Goals: Fair    Frequency Min 2X/week   Barriers to discharge        Co-evaluation               End of Session Equipment Utilized During Treatment: Gait belt Activity Tolerance: Patient limited by pain;Patient limited by fatigue Patient left: in bed;with call bell/phone within reach;with bed alarm set           Time: 0915-0950 PT Time Calculation (min) (ACUTE ONLY): 35 min   Charges:   PT Evaluation $Initial PT Evaluation Tier I: 1 Procedure PT Treatments $Therapeutic Exercise: 8-22 mins   PT G Codes:        Dorice Lamas, PT, DPT 12/29/2014, 10:06 AM

## 2014-12-29 NOTE — Progress Notes (Signed)
Surgery Progress Note  S: No acute issues O: Blood pressure 140/62, pulse 65, temperature 97.6 F (36.4 C), temperature source Oral, resp. rate 16, height 6\' 2"  (1.88 m), weight 107.366 kg (236 lb 11.2 oz), SpO2 99 %. GEN: NAD/A&Ox3 ABD: soft, incision with small opening, good granulation tissue, min drainage Perineum: Good seal to perineum  A/P 81 yo M s/p APR s/p debridement of perineal wound - boost shakes - NPO after midnight for possible vac change and debridement in OR per Dr. Algernon Huxley discretion

## 2014-12-30 ENCOUNTER — Inpatient Hospital Stay: Payer: Medicare Other | Admitting: Anesthesiology

## 2014-12-30 ENCOUNTER — Encounter
Admission: RE | Disposition: A | Discharge status: Skilled Nursing Facility | Payer: Self-pay | Source: Ambulatory Visit | Attending: Surgery

## 2014-12-30 ENCOUNTER — Encounter: Payer: Self-pay | Admitting: Anesthesiology

## 2014-12-30 HISTORY — PX: MINOR APPLICATION OF WOUND VAC: SHX6243

## 2014-12-30 LAB — GLUCOSE, CAPILLARY
GLUCOSE-CAPILLARY: 178 mg/dL — AB (ref 65–99)
GLUCOSE-CAPILLARY: 182 mg/dL — AB (ref 65–99)
Glucose-Capillary: 184 mg/dL — ABNORMAL HIGH (ref 65–99)

## 2014-12-30 SURGERY — MINOR APPLICATION OF WOUND VAC
Anesthesia: General | Wound class: Contaminated

## 2014-12-30 MED ORDER — FENTANYL CITRATE (PF) 100 MCG/2ML IJ SOLN: 25.0000 ug | INTRAMUSCULAR | Status: DC | PRN

## 2014-12-30 MED ORDER — PROPOFOL 10 MG/ML IV BOLUS
INTRAVENOUS | Status: DC | PRN
  Administered 2014-12-30: 70 mg via INTRAVENOUS
  Administered 2014-12-30: 20 mg via INTRAVENOUS
  Administered 2014-12-30: 30 mg via INTRAVENOUS

## 2014-12-30 MED ORDER — EPHEDRINE SULFATE 50 MG/ML IJ SOLN
INTRAMUSCULAR | Status: DC | PRN
  Administered 2014-12-30: 10 mg via INTRAVENOUS

## 2014-12-30 MED ORDER — LIDOCAINE HCL (CARDIAC) 20 MG/ML IV SOLN
INTRAVENOUS | Status: DC | PRN
  Administered 2014-12-30: 100 mg via INTRAVENOUS

## 2014-12-30 MED ORDER — LACTATED RINGERS IV SOLN
INTRAVENOUS | Status: DC | PRN
  Administered 2014-12-30: 12:00:00 via INTRAVENOUS

## 2014-12-30 MED ORDER — ONDANSETRON HCL 4 MG/2ML IJ SOLN: 4.0000 mg | Freq: Once | INTRAMUSCULAR | Status: AC | PRN

## 2014-12-30 SURGICAL SUPPLY — 18 items
CANISTER SUCT 1200ML W/VALVE (MISCELLANEOUS) ×3 IMPLANT
DRAPE LAPAROTOMY 100X77 ABD (DRAPES) ×3 IMPLANT
DRAPE UTILITY 15X26 TOWEL STRL (DRAPES) ×6 IMPLANT
DRAPE WOUND VAC 10X15X1CM (MISCELLANEOUS) ×3 IMPLANT
DRSG VAC ATS MED SENSATRAC (GAUZE/BANDAGES/DRESSINGS) ×3 IMPLANT
GAUZE SPONGE 4X4 12PLY STRL (GAUZE/BANDAGES/DRESSINGS) IMPLANT
GLOVE BIO SURGEON STRL SZ7.5 (GLOVE) ×9 IMPLANT
GOWN STRL REUS W/ TWL LRG LVL3 (GOWN DISPOSABLE) ×2 IMPLANT
GOWN STRL REUS W/ TWL XL LVL3 (GOWN DISPOSABLE) ×1 IMPLANT
GOWN STRL REUS W/TWL LRG LVL3 (GOWN DISPOSABLE) ×4
GOWN STRL REUS W/TWL XL LVL3 (GOWN DISPOSABLE) ×2
KIT RM TURNOVER STRD PROC AR (KITS) ×3 IMPLANT
NS IRRIG 1000ML POUR BTL (IV SOLUTION) ×3 IMPLANT
PACK BASIN MINOR ARMC (MISCELLANEOUS) ×3 IMPLANT
SOL PREP PVP 2OZ (MISCELLANEOUS) ×3
SOLUTION PREP PVP 2OZ (MISCELLANEOUS) ×1 IMPLANT
SYR BULB IRRIG 60ML STRL (SYRINGE) ×3 IMPLANT
WND VAC CANISTER 500ML (MISCELLANEOUS) ×3 IMPLANT

## 2014-12-30 NOTE — Care Management Important Message (Signed)
Important Message  Patient Details  Name: Darryl Hooper. MRN: [id number] Date of Birth: 22-Sep-1929   Medicare Important Message Given:  Yes-third notification given    [name] 12/30/2014, 10:25 AM

## 2014-12-30 NOTE — Care Management Note (Addendum)
Case Management Note  Patient Details  Name: Darryl Hooper. MRN: [id number] Date of Birth: Sep 16, 1929  Subjective/Objective:    Admitted to [facility] on 12/12/14. Scheduled today for surgical debridement of wound and replacement of wound vac. Discussed discharge planning with Toma Copier CSW, who reports that Darryl Hooper has a bed waiting for him at [facility] and can be transported to [facility] as soon as his physician writes discharge orders.                Action/Plan:   Expected Discharge Date:                  Expected Discharge Plan:  Skilled Nursing Facility  In-House Referral:  Clinical Social Work  Discharge planning Services     Post Acute Care Choice:    Choice offered to:     DME Arranged:    DME Agency:     HH Arranged:    [place] Agency:     Status of Service:  Completed, signed off  Medicare Important Message Given:  Yes-third notification given Date Medicare IM Given:    Medicare IM give by:    Date Additional Medicare IM Given:    Additional Medicare Important Message give by:     If discussed at [place] of Stay Meetings, dates discussed:    Additional Comments:  Rhanda Lemire A, RN 12/30/2014, 11:34 AM

## 2014-12-30 NOTE — Anesthesia Procedure Notes (Signed)
Procedure Name: LMA Insertion Date/Time: 12/30/2014 12:14 PM Performed by: [facility] Pre-anesthesia Checklist: Emergency Drugs available, Patient identified, Suction available and Patient being monitored Patient Re-evaluated:Patient Re-evaluated prior to inductionOxygen Delivery Method: Circle system utilized Preoxygenation: Pre-oxygenation with 100% oxygen Intubation Type: IV induction LMA: LMA inserted LMA Size: 4.0

## 2014-12-30 NOTE — Anesthesia Preprocedure Evaluation (Signed)
Anesthesia Evaluation  Patient identified by MRN, date of birth, ID band Patient awake    Reviewed: Allergy & Precautions, H&P , NPO status , Patient's Chart, lab work & pertinent test results, reviewed documented beta blocker date and time   History of Anesthesia Complications Negative for: history of anesthetic complications  Airway Mallampati: III  TM Distance: >3 FB Neck ROM: limited    Dental  (+) Chipped, Poor Dentition   Pulmonary neg pulmonary ROS,  breath sounds clear to auscultation        Cardiovascular hypertension, + Peripheral Vascular Disease Normal cardiovascular examRate:Normal     Neuro/Psych  Neuromuscular disease    GI/Hepatic Neg liver ROS, GERD-  ,  Endo/Other  diabetes, Type 2  Renal/GU Renal disease     Musculoskeletal  (+) Arthritis -,   Abdominal   Peds  Hematology   Anesthesia Other Findings Past Medical History:   Hypertension                                                 Peripheral vascular disease                                  Hyperlipidemia                                               Diarrhea                                                     Diabetes mellitus without complication                       BPH (benign prostatic hyperplasia)                           Prostate cancer                                              Arthritis                                                    Knee pain, bilateral                                         Rectal cancer                                                Chronic kidney disease  Comment:incontience   IBS (irritable bowel syndrome)                               Neuropathy due to secondary diabetes mellitus                Hx of pulmonary embolus                                        Comment:2011--treated with Coumadin   Reproductive/Obstetrics                              Anesthesia Physical  Anesthesia Plan  ASA: III  Anesthesia Plan: General LMA   Post-op Pain Management:    Induction:   Airway Management Planned:   Additional Equipment:   Intra-op Plan:   Post-operative Plan:   Informed Consent: I have reviewed the patients History and Physical, chart, labs and discussed the procedure including the risks, benefits and alternatives for the proposed anesthesia with the patient or authorized representative who has indicated his/her understanding and acceptance.   Dental Advisory Given  Plan Discussed with: CRNA, Anesthesiologist and Surgeon  Anesthesia Plan Comments:         Anesthesia Quick Evaluation

## 2014-12-30 NOTE — Consult Note (Signed)
WOC ostomy follow up Stoma type/location: LLQ COlostomy POuch intact.  Leaked overnight and staff changed entire pouch.  Patient is scheduled to go back to OR for debridement of perineal wound this AM (any minute now) and so family did not come for teaching session.   VAC therapy in place.  Will try tomorrow AM for teaching session. Written materials at bedside for patient and family.  Education provided:  Network engineer at bedside.  Enrolled patient in [place] Start Discharge program: Yes Will not follow at this time.  Please re-consult if needed.  Domenic Moras RN BSN [place] Pager [phone]

## 2014-12-30 NOTE — Progress Notes (Signed)
Patient ID: Darryl Mikes., male   DOB: [birth year], 81 y.o.   MRN: [id number]  POD 16 s/p APR.  Seen in holding area  Case and progress of last week discussed with Dr Rexene Edison  Plan [facility] change under anesthesia.  All questions addressed.

## 2014-12-30 NOTE — Progress Notes (Signed)
Resting well Did not waken No issues with VAC Plan dc foley, dc staples in am Work on [employer] soon.  Discussed in detail by phone with his daughter this am.

## 2014-12-30 NOTE — Anesthesia Postprocedure Evaluation (Signed)
  Anesthesia Post-op Note  Patient: Darryl Hooper.  Procedure(s) Performed: Procedure(s): MINOR APPLICATION OF WOUND VAC (N/A)  Anesthesia type:General LMA  Patient location: PACU  Post pain: Pain level controlled  Post assessment: Post-op Vital signs reviewed, Patient's Cardiovascular Status Stable, Respiratory Function Stable, Patent Airway and No signs of Nausea or vomiting  Post vital signs: Reviewed and stable  Last Vitals:  Filed Vitals:   12/30/14 1302  BP: 148/63  Pulse: 60  Temp:   Resp: 15    Level of consciousness: awake, alert  and patient cooperative  Complications: No apparent anesthesia complications

## 2014-12-30 NOTE — Op Note (Signed)
12/12/2014 - 12/30/2014  1:14 PM  PATIENT:  Darryl Hooper.  81 y.o. male  PRE-OPERATIVE DIAGNOSIS:  perineal wound  POST-OPERATIVE DIAGNOSIS:  perineal wound  Procedure: Exam under Anesthesia,  PLacement of wound VAC less than 50 cm2. SURGEON:  Surgeon(s) and Role:    * [name], MD - Primary   ANESTHESIA:LMA  SPECIMEN: none.  EBL: none.  Description of procedure:   Supine position LMA Dorsal lithotomy VAC removed Wound irrigated with several 100 cc of warm normal saline. Healthy granulation tissue was present. The wound did not tunnel into the pelvis. There was no exposed bowel. A piece of black granular foam was cut to size inserted into the defect which measured 7 x 3 x 5 cm in size. Bioclusive film was applied. It was bridged onto the left buttock. Negative pressure was applied with good seal. The patient was then subsequently extubated and taken to the recovery room in stable and satisfactory condition by anesthesia services.

## 2014-12-30 NOTE — Progress Notes (Signed)
Inpatient Diabetes Program Recommendations  AACE/ADA: New Consensus Statement on Inpatient Glycemic Control (2013)  Target Ranges:  Prepandial:   less than 140 mg/dL      Peak postprandial:   less than 180 mg/dL (1-2 hours)      Critically ill patients:  140 - 180 mg/dL  Results for ELSWORTH, LEDIN (MRN [id number]) as of 12/30/2014 10:33  Ref. Range 12/28/2014 11:38 12/28/2014 16:21 12/28/2014 21:22 12/29/2014 07:21 12/29/2014 11:43 12/29/2014 16:48 12/29/2014 21:37 12/30/2014 07:21  Glucose-Capillary Latest Ref Range: 65-99 mg/dL 241 (H) 192 (H) 217 (H) 152 (H) 241 (H) 198 (H) 251 (H) 178 (H)   Inpatient Diabetes Program Recommendations Insulin - Basal: add Lantus OR Levemir 20 units at HS Correction (SSI): add HS scale per Glycemic Control order-set Insulin - Meal Coverage: .X Thank you  Raoul Pitch BSN, RN,CDE Inpatient Diabetes Coordinator [phone] (team pager)

## 2014-12-30 NOTE — Transfer of Care (Signed)
Immediate Anesthesia Transfer of Care Note  Patient: Darryl Hooper.  Procedure(s) Performed: Procedure(s): MINOR APPLICATION OF WOUND VAC (N/A)  Patient Location: PACU  Anesthesia Type:General  Level of Consciousness: awake, alert  and oriented  Airway & Oxygen Therapy: Patient Spontanous Breathing and Patient connected to face mask oxygen  Post-op Assessment: Report given to RN and Post -op Vital signs reviewed and stable  Post vital signs: Reviewed and stable  Last Vitals:  Filed Vitals:   12/30/14 1257  BP: 123/65  Pulse: 68  Temp: 37.1 C  Resp: 15    Complications: No apparent anesthesia complications

## 2014-12-31 DIAGNOSIS — E878 Other disorders of electrolyte and fluid balance, not elsewhere classified: Secondary | ICD-10-CM | POA: Diagnosis not present

## 2014-12-31 DIAGNOSIS — I739 Peripheral vascular disease, unspecified: Secondary | ICD-10-CM | POA: Diagnosis not present

## 2014-12-31 DIAGNOSIS — N400 Enlarged prostate without lower urinary tract symptoms: Secondary | ICD-10-CM | POA: Diagnosis not present

## 2014-12-31 DIAGNOSIS — D500 Iron deficiency anemia secondary to blood loss (chronic): Secondary | ICD-10-CM | POA: Diagnosis present

## 2014-12-31 DIAGNOSIS — L89154 Pressure ulcer of sacral region, stage 4: Secondary | ICD-10-CM | POA: Diagnosis not present

## 2014-12-31 DIAGNOSIS — M1990 Unspecified osteoarthritis, unspecified site: Secondary | ICD-10-CM | POA: Diagnosis not present

## 2014-12-31 DIAGNOSIS — L03319 Cellulitis of trunk, unspecified: Secondary | ICD-10-CM | POA: Diagnosis not present

## 2014-12-31 DIAGNOSIS — Z79899 Other long term (current) drug therapy: Secondary | ICD-10-CM | POA: Diagnosis not present

## 2014-12-31 DIAGNOSIS — N320 Bladder-neck obstruction: Secondary | ICD-10-CM | POA: Diagnosis present

## 2014-12-31 DIAGNOSIS — R262 Difficulty in walking, not elsewhere classified: Secondary | ICD-10-CM | POA: Diagnosis not present

## 2014-12-31 DIAGNOSIS — E559 Vitamin D deficiency, unspecified: Secondary | ICD-10-CM | POA: Diagnosis not present

## 2014-12-31 DIAGNOSIS — L02219 Cutaneous abscess of trunk, unspecified: Secondary | ICD-10-CM | POA: Diagnosis not present

## 2014-12-31 DIAGNOSIS — R64 Cachexia: Secondary | ICD-10-CM | POA: Diagnosis not present

## 2014-12-31 DIAGNOSIS — R5381 Other malaise: Secondary | ICD-10-CM | POA: Diagnosis not present

## 2014-12-31 DIAGNOSIS — Z86711 Personal history of pulmonary embolism: Secondary | ICD-10-CM | POA: Diagnosis not present

## 2014-12-31 DIAGNOSIS — Z5189 Encounter for other specified aftercare: Secondary | ICD-10-CM | POA: Diagnosis not present

## 2014-12-31 DIAGNOSIS — Z993 Dependence on wheelchair: Secondary | ICD-10-CM | POA: Diagnosis not present

## 2014-12-31 DIAGNOSIS — Z66 Do not resuscitate: Secondary | ICD-10-CM | POA: Diagnosis not present

## 2014-12-31 DIAGNOSIS — R251 Tremor, unspecified: Secondary | ICD-10-CM | POA: Diagnosis not present

## 2014-12-31 DIAGNOSIS — C20 Malignant neoplasm of rectum: Secondary | ICD-10-CM | POA: Diagnosis not present

## 2014-12-31 DIAGNOSIS — E1122 Type 2 diabetes mellitus with diabetic chronic kidney disease: Secondary | ICD-10-CM | POA: Diagnosis not present

## 2014-12-31 DIAGNOSIS — M6281 Muscle weakness (generalized): Secondary | ICD-10-CM | POA: Diagnosis not present

## 2014-12-31 DIAGNOSIS — I959 Hypotension, unspecified: Secondary | ICD-10-CM | POA: Diagnosis not present

## 2014-12-31 DIAGNOSIS — Z79891 Long term (current) use of opiate analgesic: Secondary | ICD-10-CM | POA: Diagnosis not present

## 2014-12-31 DIAGNOSIS — C189 Malignant neoplasm of colon, unspecified: Secondary | ICD-10-CM | POA: Diagnosis not present

## 2014-12-31 DIAGNOSIS — E785 Hyperlipidemia, unspecified: Secondary | ICD-10-CM | POA: Diagnosis not present

## 2014-12-31 DIAGNOSIS — E43 Unspecified severe protein-calorie malnutrition: Secondary | ICD-10-CM | POA: Diagnosis not present

## 2014-12-31 DIAGNOSIS — Z96652 Presence of left artificial knee joint: Secondary | ICD-10-CM | POA: Diagnosis present

## 2014-12-31 DIAGNOSIS — E119 Type 2 diabetes mellitus without complications: Secondary | ICD-10-CM | POA: Diagnosis not present

## 2014-12-31 DIAGNOSIS — Z8546 Personal history of malignant neoplasm of prostate: Secondary | ICD-10-CM | POA: Diagnosis not present

## 2014-12-31 DIAGNOSIS — Z9889 Other specified postprocedural states: Secondary | ICD-10-CM | POA: Diagnosis not present

## 2014-12-31 DIAGNOSIS — E875 Hyperkalemia: Secondary | ICD-10-CM | POA: Diagnosis present

## 2014-12-31 DIAGNOSIS — R531 Weakness: Secondary | ICD-10-CM | POA: Diagnosis not present

## 2014-12-31 DIAGNOSIS — R1084 Generalized abdominal pain: Secondary | ICD-10-CM | POA: Diagnosis not present

## 2014-12-31 DIAGNOSIS — I10 Essential (primary) hypertension: Secondary | ICD-10-CM | POA: Diagnosis not present

## 2014-12-31 DIAGNOSIS — N179 Acute kidney failure, unspecified: Secondary | ICD-10-CM | POA: Diagnosis not present

## 2014-12-31 DIAGNOSIS — N183 Chronic kidney disease, stage 3 (moderate): Secondary | ICD-10-CM | POA: Diagnosis not present

## 2014-12-31 DIAGNOSIS — L8990 Pressure ulcer of unspecified site, unspecified stage: Secondary | ICD-10-CM | POA: Diagnosis not present

## 2014-12-31 DIAGNOSIS — R5383 Other fatigue: Secondary | ICD-10-CM | POA: Diagnosis not present

## 2014-12-31 DIAGNOSIS — E11649 Type 2 diabetes mellitus with hypoglycemia without coma: Secondary | ICD-10-CM | POA: Diagnosis not present

## 2014-12-31 DIAGNOSIS — E860 Dehydration: Secondary | ICD-10-CM | POA: Diagnosis not present

## 2014-12-31 DIAGNOSIS — E784 Other hyperlipidemia: Secondary | ICD-10-CM | POA: Diagnosis not present

## 2014-12-31 DIAGNOSIS — G8929 Other chronic pain: Secondary | ICD-10-CM | POA: Diagnosis present

## 2014-12-31 DIAGNOSIS — I129 Hypertensive chronic kidney disease with stage 1 through stage 4 chronic kidney disease, or unspecified chronic kidney disease: Secondary | ICD-10-CM | POA: Diagnosis not present

## 2014-12-31 DIAGNOSIS — J392 Other diseases of pharynx: Secondary | ICD-10-CM | POA: Diagnosis not present

## 2014-12-31 DIAGNOSIS — K650 Generalized (acute) peritonitis: Secondary | ICD-10-CM | POA: Diagnosis not present

## 2014-12-31 DIAGNOSIS — L98499 Non-pressure chronic ulcer of skin of other sites with unspecified severity: Secondary | ICD-10-CM | POA: Diagnosis not present

## 2014-12-31 DIAGNOSIS — E162 Hypoglycemia, unspecified: Secondary | ICD-10-CM | POA: Diagnosis present

## 2014-12-31 DIAGNOSIS — K651 Peritoneal abscess: Secondary | ICD-10-CM | POA: Diagnosis not present

## 2014-12-31 DIAGNOSIS — L98413 Non-pressure chronic ulcer of buttock with necrosis of muscle: Secondary | ICD-10-CM | POA: Diagnosis not present

## 2014-12-31 DIAGNOSIS — R05 Cough: Secondary | ICD-10-CM | POA: Diagnosis not present

## 2014-12-31 DIAGNOSIS — E1142 Type 2 diabetes mellitus with diabetic polyneuropathy: Secondary | ICD-10-CM | POA: Diagnosis not present

## 2014-12-31 DIAGNOSIS — N1330 Unspecified hydronephrosis: Secondary | ICD-10-CM | POA: Diagnosis not present

## 2014-12-31 DIAGNOSIS — Z515 Encounter for palliative care: Secondary | ICD-10-CM | POA: Diagnosis not present

## 2014-12-31 DIAGNOSIS — Z933 Colostomy status: Secondary | ICD-10-CM | POA: Diagnosis not present

## 2014-12-31 DIAGNOSIS — N1339 Other hydronephrosis: Secondary | ICD-10-CM | POA: Diagnosis not present

## 2014-12-31 DIAGNOSIS — R627 Adult failure to thrive: Secondary | ICD-10-CM | POA: Diagnosis not present

## 2014-12-31 DIAGNOSIS — Z22322 Carrier or suspected carrier of Methicillin resistant Staphylococcus aureus: Secondary | ICD-10-CM | POA: Diagnosis not present

## 2014-12-31 DIAGNOSIS — G629 Polyneuropathy, unspecified: Secondary | ICD-10-CM | POA: Diagnosis not present

## 2014-12-31 DIAGNOSIS — T889XXS Complication of surgical and medical care, unspecified, sequela: Secondary | ICD-10-CM | POA: Diagnosis not present

## 2014-12-31 DIAGNOSIS — E1165 Type 2 diabetes mellitus with hyperglycemia: Secondary | ICD-10-CM | POA: Diagnosis present

## 2014-12-31 DIAGNOSIS — C61 Malignant neoplasm of prostate: Secondary | ICD-10-CM | POA: Diagnosis not present

## 2014-12-31 DIAGNOSIS — K589 Irritable bowel syndrome without diarrhea: Secondary | ICD-10-CM | POA: Diagnosis not present

## 2014-12-31 DIAGNOSIS — Z85048 Personal history of other malignant neoplasm of rectum, rectosigmoid junction, and anus: Secondary | ICD-10-CM | POA: Diagnosis not present

## 2014-12-31 DIAGNOSIS — E871 Hypo-osmolality and hyponatremia: Secondary | ICD-10-CM | POA: Diagnosis not present

## 2014-12-31 DIAGNOSIS — M25561 Pain in right knee: Secondary | ICD-10-CM | POA: Diagnosis present

## 2014-12-31 DIAGNOSIS — N189 Chronic kidney disease, unspecified: Secondary | ICD-10-CM | POA: Diagnosis not present

## 2014-12-31 DIAGNOSIS — Z791 Long term (current) use of non-steroidal anti-inflammatories (NSAID): Secondary | ICD-10-CM | POA: Diagnosis not present

## 2014-12-31 DIAGNOSIS — E1140 Type 2 diabetes mellitus with diabetic neuropathy, unspecified: Secondary | ICD-10-CM | POA: Diagnosis not present

## 2014-12-31 DIAGNOSIS — L89152 Pressure ulcer of sacral region, stage 2: Secondary | ICD-10-CM | POA: Diagnosis not present

## 2014-12-31 DIAGNOSIS — N390 Urinary tract infection, site not specified: Secondary | ICD-10-CM | POA: Diagnosis not present

## 2014-12-31 DIAGNOSIS — T814XXD Infection following a procedure, subsequent encounter: Secondary | ICD-10-CM | POA: Diagnosis not present

## 2014-12-31 DIAGNOSIS — L89159 Pressure ulcer of sacral region, unspecified stage: Secondary | ICD-10-CM | POA: Diagnosis not present

## 2014-12-31 DIAGNOSIS — J069 Acute upper respiratory infection, unspecified: Secondary | ICD-10-CM | POA: Diagnosis not present

## 2014-12-31 LAB — GLUCOSE, CAPILLARY
GLUCOSE-CAPILLARY: 151 mg/dL — AB (ref 65–99)
GLUCOSE-CAPILLARY: 229 mg/dL — AB (ref 65–99)

## 2014-12-31 MED ORDER — ENSURE ENLIVE PO LIQD: 237.0000 mL | Freq: Three times a day (TID) | ORAL | Status: DC

## 2014-12-31 MED ORDER — HYDROCODONE-ACETAMINOPHEN 5-325 MG PO TABS
1.0000 | ORAL_TABLET | Freq: Four times a day (QID) | ORAL | Status: DC | PRN
Start: 2014-12-31 — End: 2015-02-07

## 2014-12-31 MED ORDER — LOSARTAN POTASSIUM 100 MG PO TABS: 100.0000 mg | ORAL_TABLET | Freq: Every day | ORAL | Status: AC

## 2014-12-31 MED ORDER — AMLODIPINE BESYLATE 2.5 MG PO TABS: 2.5000 mg | ORAL_TABLET | Freq: Every day | ORAL | Status: AC

## 2014-12-31 NOTE — Discharge Summary (Signed)
Physician Discharge Summary  Patient ID: Darryl Hooper. MRN: [id number] DOB/AGE: 12/14/1929 81 y.o.  Admit date: 12/12/2014 Discharge date: 12/31/2014  Admission Diagnoses: Rectal cancer Wound infection  Discharge Diagnoses: same.   Discharged Condition: stable  Hospital Course:   The patient was brought to surgery for an abdominal perineal resection. Please see previous discharge summary dated 724 of 39. In the interval the patient's perineal wound demonstrated evidence of infection and necrosis and he required multiple debridements in the operating room followed by application of a wound VAC assisted closure device. By the time of his discharge he was tolerating wound VAC therapy well. His lower midline wound had a minor wound infection which is granulating nicely and his staples removed today of his discharge.    Discharge Exam: Blood pressure 156/62, pulse 69, temperature 98.3 F (36.8 C), temperature source Oral, resp. rate 18, height 6\' 2"  (1.88 m), weight 236 lb 3.2 oz (107.14 kg), SpO2 98 %.  Patient's perineal wound was healthy granulating. Lower midline wound was healthy and granulating. Ostomy was functioning. His abdomen was soft and nondistended.  Disposition: snf/rehab    Medication List    STOP taking these medications        acetaminophen 500 MG tablet  Commonly known as:  TYLENOL     bisacodyl 5 MG EC tablet  Commonly known as:  bisacodyl     polyethylene glycol powder powder  Commonly known as:  GLYCOLAX/MIRALAX      TAKE these medications        amLODipine 2.5 MG tablet  Commonly known as:  NORVASC  Take 1 tablet by mouth daily.     amLODipine 2.5 MG tablet  Commonly known as:  NORVASC  Take 1 tablet (2.5 mg total) by mouth daily.     cholecalciferol 1000 UNITS tablet  Commonly known as:  VITAMIN D  Take 2,000 Units by mouth daily.     feeding supplement (ENSURE ENLIVE) Liqd  Take 237 mLs by mouth 3 (three) times daily between meals.     gabapentin 100 MG capsule  Commonly known as:  NEURONTIN  Take 1 capsule (100 mg total) by mouth at bedtime.     glimepiride 4 MG tablet  Commonly known as:  AMARYL  Take 4 mg by mouth daily.     HYDROcodone-acetaminophen 5-325 MG per tablet  Commonly known as:  NORCO/VICODIN  Take 1 tablet by mouth every 6 (six) hours as needed for moderate pain.     losartan 100 MG tablet  Commonly known as:  COZAAR  Take 1 tablet (100 mg total) by mouth daily.           Follow-up Information    Follow up with [name], MD On 01/20/2015.   Specialties:  Surgery, Radiology   Why:  For wound re-check, For suture removal   Contact information:   [address] [place]  [address] [phone]       Signed: [name] 12/31/2014, 1:11 PM

## 2014-12-31 NOTE — Clinical Social Work Note (Signed)
Pt is ready for discharge today to [facility]. CSW confirmed that wound vac is at facility. RN will call report and EMS will provide transportation. Pt declined to have CSW call family. Per pt, daughter and wife are aware and agreeable to discharge plan. CSW is signing off as no further needs identified.   Darden Dates, MSW, [place] Social Worker  [phone]

## 2014-12-31 NOTE — Consult Note (Signed)
WOC ostomy follow up Stoma type/location: LLQ Colostomy Stomal assessment/size: 1 1/4" slightly oval, denuded skin from 4 to 7 o'clock.  Barrier ring and stoma powder used to protect this skin.  Peristomal assessment: Denuded Treatment options for stomal/peristomal skin: Barrier ring and stoma powder Output soft brown stool.  Pouch has been getting too full and leaking.  Staff and patient/family encouraged to empty when 1/3 full.  Ostomy pouching: 2pc. 2 1/4 " with barrier ring.   Education provided: Daughter and spouse atbedside.  POuch change performed. Explained rationale for barrier ring and stoma powder to promote healing of denuded skin.  Sent home with 3pouches and 2 barrier rings.   VAC in place to be discharged today.  VAC changed performed at the bedside by Dr Marina Gravel yesterday.     Enrolled patient in [place] Start Discharge program: Yes Will not follow at this time.  Please re-consult if needed.  Domenic Moras RN BSN [place] Pager [phone]

## 2014-12-31 NOTE — Progress Notes (Signed)
Physical Therapy Treatment Patient Details Name: Darryl Hooper. MRN: [id number] DOB: 04/02/1930 Today's Date: 12/31/2014    History of Present Illness Pt is an 81 yo male admitted to the hospital s/p APR surgery for rectal cancer.     PT Comments    Pt wishes out of bed and up in chair. Continues to requires heavy assist of 2, but improving with ability to attain near upright posture for a short time to attempt ambulation for transfer to the chair. Pt fatigues quickly requiring maximal assist ultimately. Encouraged pt to tolerate up in chair for an hour or more if tolerable. Continue PT for progression of strength, posture, endurance, bed mobility, transfers and gait to improve functional mobility.   Follow Up Recommendations  SNF     Equipment Recommendations  None recommended by PT    Recommendations for Other Services       Precautions / Restrictions Precautions Precautions: Fall Restrictions Weight Bearing Restrictions: No    Mobility  Bed Mobility Overal bed mobility: Needs Assistance Bed Mobility: Supine to Sit;Sit to Supine     Supine to sit: Max assist     General bed mobility comments: Increased encouragement and instruction for sequencing. Once seated, maintains seated position well without assist  Transfers Overall transfer level: Needs assistance Equipment used: Rolling walker (2 wheeled) Transfers: Sit to/from Stand Sit to Stand: Max assist;+2 physical assistance (bed elevated; requires several attempts)         General transfer comment: Increased encouragement required; pt attains almost full upright stand, but requires some assist at knees and for proper hand placement on rw.  Ambulation/Gait Ambulation/Gait assistance: Mod assist;+2 physical assistance Ambulation Distance (Feet): 3 Feet Assistive device: Rolling walker (2 wheeled) Gait Pattern/deviations:  (several small side steps with poor clearance.;)     General Gait Details: Pt loses  strength quickly losing upright posture. Mod quicly to Max assist with need to pivot pt to chair.    Stairs            Wheelchair Mobility    Modified Rankin (Stroke Patients Only)       Balance Overall balance assessment: Needs assistance Sitting-balance support: Bilateral upper extremity supported Sitting balance-Leahy Scale: Fair   Postural control:  (forward flexed) Standing balance support: Bilateral upper extremity supported Standing balance-Leahy Scale: Poor Standing balance comment: Poor stand endurance                    Cognition Arousal/Alertness: Awake/alert Behavior During Therapy: WFL for tasks assessed/performed Overall Cognitive Status: Within Functional Limits for tasks assessed                      Exercises      General Comments        Pertinent Vitals/Pain Pain Assessment: 0-10 Pain Score: 4  Pain Location: L knee Pain Descriptors / Indicators: Aching;Burning;Constant Pain Intervention(s): Premedicated before session;Limited activity within patient's tolerance    Home Living                      Prior Function            PT Goals (current goals can now be found in the care plan section) Progress towards PT goals: Progressing toward goals    Frequency  Min 2X/week    PT Plan Current plan remains appropriate    Co-evaluation             End of Session  Equipment Utilized During Treatment: Gait belt Activity Tolerance: Patient limited by fatigue Patient left: in chair;with call bell/phone within reach;with chair alarm set;with nursing/sitter in room     Time: 1033-1106 PT Time Calculation (min) (ACUTE ONLY): 33 min  Charges:  $Gait Training: 8-22 mins $Therapeutic Activity: 8-22 mins                    G Codes:      Charlaine Dalton 12/31/2014, 11:41 AM

## 2014-12-31 NOTE — Clinical Social Work Placement (Signed)
   CLINICAL SOCIAL WORK PLACEMENT  NOTE  Date:  12/31/2014  Patient Details  Name: Darryl Hooper. MRN: [id number] Date of Birth: 04-12-1930  Clinical Social Work is seeking post-discharge placement for this patient at the [place] level of care (*CSW will initial, date and re-position this form in  chart as items are completed):  Yes   Patient/family provided with [place] Work Department's list of facilities offering this level of care within the geographic area requested by the patient (or if unable, by the patient's family).  Yes   Patient/family informed of their freedom to choose among providers that offer the needed level of care, that participate in Medicare, Medicaid or managed care program needed by the patient, have an available bed and are willing to accept the patient.  Yes   Patient/family informed of [facility] ownership interest in [facility] and [facility], as well as of the fact that they are under no obligation to receive care at these facilities.  PASRR submitted to EDS on       PASRR number received on       Existing PASRR number confirmed on 12/17/14     FL2 transmitted to all facilities in geographic area requested by pt/family on 12/17/14     FL2 transmitted to all facilities within larger geographic area on       Patient informed that his/her managed care company has contracts with or will negotiate with certain facilities, including the following:        Yes   Patient/family informed of bed offers received.  Patient chooses bed at  ([facility])     Physician recommends and patient chooses bed at  [facility])    Patient to be transferred to  ([facility]) on 12/31/14.  Patient to be transferred to facility by  [facility])     Patient family notified on 12/31/14 of transfer.  Name of family member notified:   (Pt notified, pt and RN notified family )     PHYSICIAN Please sign FL2      Additional Comment:    _______________________________________________ Darden Dates, LCSW 12/31/2014, 3:11 PM

## 2014-12-31 NOTE — Progress Notes (Addendum)
A&O. VSS. Tolerating regular diet well. LLQ colostomy cooperating well, pt tolerating well. Soft, formed, brown stool functioning. Wound vac to rectal area present. Dressing dry and intact. Midline abdominal incision staples removed and wet to dry gauze dressing changed. Pt tolerated well. Discharged per MD orders. Pt and family verbalize understanding. Report called to Dede, RN at [facility]. IV removed per policy. Prescriptions and other information given to facility via EMS. Awaiting EMS to arrive. Belongings packed and taken home by daughter and wife. Awaiting EMS to arrive for transportation. EMS arrived, discharged via stretcher transported by EMS.

## 2015-01-01 LAB — GLUCOSE, CAPILLARY: Glucose-Capillary: 196 mg/dL — ABNORMAL HIGH (ref 65–99)

## 2015-01-02 DIAGNOSIS — I10 Essential (primary) hypertension: Secondary | ICD-10-CM | POA: Diagnosis not present

## 2015-01-02 DIAGNOSIS — E784 Other hyperlipidemia: Secondary | ICD-10-CM | POA: Diagnosis not present

## 2015-01-02 DIAGNOSIS — C189 Malignant neoplasm of colon, unspecified: Secondary | ICD-10-CM | POA: Diagnosis not present

## 2015-01-02 DIAGNOSIS — E119 Type 2 diabetes mellitus without complications: Secondary | ICD-10-CM | POA: Diagnosis not present

## 2015-01-03 ENCOUNTER — Ambulatory Visit: Payer: Self-pay | Admitting: Surgery

## 2015-01-03 ENCOUNTER — Telehealth: Payer: Self-pay

## 2015-01-03 NOTE — Telephone Encounter (Signed)
[name], nurse from [facility] called at this time. She states that she cannot get the wound vac to seal. The wound nurse, [name] has also tried and cannot get this to seal. They are currently doing wet to dry on this wound.  Call was made to Dr. Marina Gravel. He wants to see patient in office this afternoon in [place] to get wound vac to seal.  Called Kristy back and explained that Darryl Hooper needs to be sent to [facility] office for a 3:30 appointment today with Dr. Marina Gravel and to send wound vac and supplies for this to be changed. She verbalizes understanding.

## 2015-01-09 DIAGNOSIS — G629 Polyneuropathy, unspecified: Secondary | ICD-10-CM | POA: Diagnosis not present

## 2015-01-09 DIAGNOSIS — I10 Essential (primary) hypertension: Secondary | ICD-10-CM | POA: Diagnosis not present

## 2015-01-09 DIAGNOSIS — E119 Type 2 diabetes mellitus without complications: Secondary | ICD-10-CM | POA: Diagnosis not present

## 2015-01-09 DIAGNOSIS — C189 Malignant neoplasm of colon, unspecified: Secondary | ICD-10-CM | POA: Diagnosis not present

## 2015-01-10 NOTE — Addendum Note (Signed)
Addendum  created 01/10/15 1024 by Martha Clan, MD   Modules edited: Anesthesia Responsible Staff

## 2015-01-14 ENCOUNTER — Other Ambulatory Visit: Payer: Self-pay | Admitting: *Deleted

## 2015-01-16 ENCOUNTER — Encounter: Payer: Self-pay | Admitting: Surgery

## 2015-01-16 ENCOUNTER — Telehealth: Payer: Self-pay

## 2015-01-16 ENCOUNTER — Ambulatory Visit (INDEPENDENT_AMBULATORY_CARE_PROVIDER_SITE_OTHER): Payer: Medicare Other | Admitting: Surgery

## 2015-01-16 VITALS — BP 116/69 | HR 82 | Temp 97.2°F | Ht 74.0 in | Wt 236.0 lb

## 2015-01-16 DIAGNOSIS — E119 Type 2 diabetes mellitus without complications: Secondary | ICD-10-CM | POA: Diagnosis not present

## 2015-01-16 DIAGNOSIS — C189 Malignant neoplasm of colon, unspecified: Secondary | ICD-10-CM | POA: Diagnosis not present

## 2015-01-16 DIAGNOSIS — G629 Polyneuropathy, unspecified: Secondary | ICD-10-CM | POA: Diagnosis not present

## 2015-01-16 DIAGNOSIS — I10 Essential (primary) hypertension: Secondary | ICD-10-CM | POA: Diagnosis not present

## 2015-01-16 DIAGNOSIS — Z5189 Encounter for other specified aftercare: Secondary | ICD-10-CM

## 2015-01-16 NOTE — Telephone Encounter (Signed)
Kim called back in and states that patient will be brought by another source of transportation and will be here at 1345 today.  No need to inform Surgeon on call now.

## 2015-01-16 NOTE — Telephone Encounter (Signed)
Darryl Hooper called back in and states that they cannot have him brought to office until 4:30pm. Informed Darryl Hooper to just send patient to ED.  Dr. Adonis Huguenin (on-call) notified that patient will be sent to ED.

## 2015-01-16 NOTE — Patient Instructions (Addendum)
We have changed your appointment to scheduled appt on 01/22/15 with Dr. Marina Gravel in the [facility] office at 11:15am.  Continue wound vac to sacrum/perineum. We have taken wound vac off today, please replace as soon as he gets back to your facility.  Continue bid wet to dry to incisional wound on abdomen.  We have updated your family on the progress and you may call and speak with a nurse with any further problems.

## 2015-01-16 NOTE — Telephone Encounter (Signed)
Lattie Haw called from [facility] and states that patient has Purulent Foul smelling drainage coming from all incision sites and wound vac site. Denies pain or fever at this time.  Lattie Haw states that patient has been placed on Levaquin 500mg  PO BID beginning this morning. Informed her that we would need to see patient as soon as possible.  Vitals this am are: Temp- 97.5, Heartrate- 64, and BP of 104/85.  Spoke with Dr. Rexene Edison and patient was added on for 1:45pm today.

## 2015-01-16 NOTE — Progress Notes (Signed)
Asked per Rehab to evaluate wound for "foul odor"  Blood pressure 116/69, pulse 82, temperature 97.2 F (36.2 C), temperature source Oral, height 6\' 2"  (1.88 m), weight 236 lb (107.049 kg).  Abdominal wound visualized, good granulation tissue, no bleeding, no abscess/erythema/induration  Sacral wound - approx 1 x 1 cm area of sacral breakdown with small area of gray tissue, debrided sharply, tunnels to perineal wound Perineal wound - good granulation with easy bleeding, some small areas of stringy necrotic tissue which was debrided.  Also with some gray tissue over coccyx, which was not debrided   Recs: continue bid wet to dry to incisional wound - continue wound vac to sacrum

## 2015-01-20 ENCOUNTER — Ambulatory Visit: Payer: Medicare Other | Admitting: Surgery

## 2015-01-22 ENCOUNTER — Ambulatory Visit (INDEPENDENT_AMBULATORY_CARE_PROVIDER_SITE_OTHER): Payer: Medicare Other | Admitting: Surgery

## 2015-01-22 ENCOUNTER — Encounter: Payer: Self-pay | Admitting: Surgery

## 2015-01-22 VITALS — BP 103/63 | HR 82 | Temp 97.9°F

## 2015-01-22 DIAGNOSIS — C20 Malignant neoplasm of rectum: Secondary | ICD-10-CM

## 2015-01-22 NOTE — Progress Notes (Signed)
Surgery clinic  The patient is now 6 weeks status post abdominal perineal resection. His postoperative course was located by perineal wound dehiscence and necrosis. He's been getting wound VAC changes. He was seen in the office last week by my associate and was noted to have a small sacral decubitus ulcer.  The patient continues to be very weak.  Physical examination midline wound is healed nicely. Ostomy is brooked and viable. Existing wound VAC was removed. There was a large necrotic sacral decubitus ulcer stage III. This was debrided sharply. Dry dressing was applied.  The prior perineal wound is granulating nicely and contracting.  Cutis ulcer measured at least 2 x 2 x 4 cm deep in tract to the perineal wound via a skin bridge.  Was markedly weak and essentially unable to stand.  He required max to person assist from wheelchair to lying position.  Impression stage III decubitus ulcer on top of perineal wound.  Recommendations:   Discontinue wound VAC  Wet to dry dressing changes 3 times a day  Outpatient referral to our wound care center in [place].  I had a frank discussion with the patient's daughter and wife. I recommended them start thinking about hospice care. I'll contact Dr. Shari Heritage. He is to see oncology on Friday. I do not think that he will tolerate any type of adjuvant chemotherapy.

## 2015-01-24 ENCOUNTER — Inpatient Hospital Stay: Payer: Medicare Other | Attending: Oncology | Admitting: Oncology

## 2015-01-24 ENCOUNTER — Inpatient Hospital Stay: Payer: Medicare Other

## 2015-01-24 VITALS — BP 90/54 | HR 85 | Temp 99.5°F | Resp 18

## 2015-01-24 DIAGNOSIS — T889XXS Complication of surgical and medical care, unspecified, sequela: Secondary | ICD-10-CM | POA: Diagnosis not present

## 2015-01-24 DIAGNOSIS — Z9889 Other specified postprocedural states: Secondary | ICD-10-CM | POA: Insufficient documentation

## 2015-01-24 DIAGNOSIS — E785 Hyperlipidemia, unspecified: Secondary | ICD-10-CM | POA: Insufficient documentation

## 2015-01-24 DIAGNOSIS — J392 Other diseases of pharynx: Secondary | ICD-10-CM | POA: Diagnosis not present

## 2015-01-24 DIAGNOSIS — E1140 Type 2 diabetes mellitus with diabetic neuropathy, unspecified: Secondary | ICD-10-CM | POA: Insufficient documentation

## 2015-01-24 DIAGNOSIS — R5383 Other fatigue: Secondary | ICD-10-CM | POA: Insufficient documentation

## 2015-01-24 DIAGNOSIS — R5381 Other malaise: Secondary | ICD-10-CM | POA: Diagnosis not present

## 2015-01-24 DIAGNOSIS — L89159 Pressure ulcer of sacral region, unspecified stage: Secondary | ICD-10-CM | POA: Diagnosis not present

## 2015-01-24 DIAGNOSIS — Z79899 Other long term (current) drug therapy: Secondary | ICD-10-CM | POA: Insufficient documentation

## 2015-01-24 DIAGNOSIS — Z933 Colostomy status: Secondary | ICD-10-CM | POA: Diagnosis not present

## 2015-01-24 DIAGNOSIS — I129 Hypertensive chronic kidney disease with stage 1 through stage 4 chronic kidney disease, or unspecified chronic kidney disease: Secondary | ICD-10-CM | POA: Insufficient documentation

## 2015-01-24 DIAGNOSIS — Z86711 Personal history of pulmonary embolism: Secondary | ICD-10-CM | POA: Diagnosis not present

## 2015-01-24 DIAGNOSIS — C20 Malignant neoplasm of rectum: Secondary | ICD-10-CM | POA: Insufficient documentation

## 2015-01-24 DIAGNOSIS — Z993 Dependence on wheelchair: Secondary | ICD-10-CM

## 2015-01-24 DIAGNOSIS — Z8546 Personal history of malignant neoplasm of prostate: Secondary | ICD-10-CM | POA: Diagnosis not present

## 2015-01-24 DIAGNOSIS — R531 Weakness: Secondary | ICD-10-CM | POA: Insufficient documentation

## 2015-01-24 DIAGNOSIS — N189 Chronic kidney disease, unspecified: Secondary | ICD-10-CM | POA: Diagnosis not present

## 2015-01-24 DIAGNOSIS — L98499 Non-pressure chronic ulcer of skin of other sites with unspecified severity: Secondary | ICD-10-CM | POA: Diagnosis not present

## 2015-01-24 DIAGNOSIS — L98413 Non-pressure chronic ulcer of buttock with necrosis of muscle: Secondary | ICD-10-CM | POA: Diagnosis not present

## 2015-01-24 NOTE — Progress Notes (Signed)
Pt here for ongoing eval of Rectal Ca. Pt recovering from complications of his abd perineal resection with wound dehiscence. Wound vac d/c'd. Nurses at [facility]/ SNF/rehab doing wet to dry dsg changes 3x day. Pt has a sacral decub. Pt denies sacral pain. Denies abd pain or nausea. Colostomy intact with normal stool output. Eats 3 small meals per day. Not able to stand, wheelchair bound. Pt is weak. Complains of bil leg and knee pain;aching. Takes Norco prn for the pain. States he sleeps good at night. Pt has a caregiver from [facility] with him today.

## 2015-01-24 NOTE — Progress Notes (Signed)
[place]  Telephone:([phone] [phone]  ID: Darryl Hooper. OB: [birth year]  MR#: [id number]  XQJ#:[id number]  Patient Care Team: Arlis Porta., MD as PCP - General (Family Medicine) Clent Jacks, RN as Registered Nurse  CHIEF COMPLAINT:  Rectal cancer with multiple postoperative complications.   INTERVAL HISTORY: Patient returns to clinic today for  Further evaluation. He recently underwent surgery for his rectal cancer and had multiple postoperative complications requiring 3 additional surgeries. He also has developed a sacral decubitus ulcer in the interim. Patient feels significantly weak and fatigued, but otherwise feels well. He denies any pain. He has no weight loss. He denies any recent fevers. He denies any dysphasia. He denies any chest pain or shortness of breath. Patient offers no further specific complaints.  REVIEW OF SYSTEMS:   Review of Systems  Constitutional: Negative for fever, weight loss and malaise/fatigue.  HENT: Negative for sore throat.   Respiratory: Negative.   Cardiovascular: Negative for chest pain.  Gastrointestinal: Negative.   Neurological: Negative for weakness.    As per HPI. Otherwise, a complete review of systems is negatve.  PAST MEDICAL HISTORY: Past Medical History  Diagnosis Date  . Hypertension   . Peripheral vascular disease   . Hyperlipidemia   . Diarrhea   . Diabetes mellitus without complication   . BPH (benign prostatic hyperplasia)   . Prostate cancer   . Arthritis   . Knee pain, bilateral   . Rectal cancer   . Chronic kidney disease     incontience  . IBS (irritable bowel syndrome)   . Neuropathy due to secondary diabetes mellitus   . Hx of pulmonary embolus     2011--treated with Coumadin    PAST SURGICAL HISTORY: Past Surgical History  Procedure Laterality Date  . Prostatectomy    . Joint replacement      left knee  . Colonoscopy N/A 10/23/2014    Procedure:  COLONOSCOPY;  Surgeon: Lucilla Lame, MD;  Location: [facility];  Service: Gastroenterology;  Laterality: N/A;  . Knee arthroscopy w/ meniscal repair Left 2012  . Eus N/A 11/14/2014    Procedure: LOWER ENDOSCOPIC ULTRASOUND (EUS);  Surgeon: Holly Bodily, MD;  Location: [facility] END[facility];  Service: Endoscopy;  Laterality: N/A;  . Eye surgery Bilateral     cataract extraction  . Tonsillectomy    . Abdominal perineal bowel resection N/A 12/12/2014    Procedure: ABDOMINAL PERINEAL RESECTION;  Surgeon: [name], MD;  Location: [facility] ORS;  Service: General;  Laterality: N/A;  . Cystoscopy with stent placement Bilateral 12/12/2014    Procedure: cystoscopy, bilateral retrogrades, left ureteral stent placement, attempted right stent placement.;  Surgeon: Hollice Espy, MD;  Location: [facility] ORS;  Service: Urology;  Laterality: Bilateral;  . Irrigation and debridement abscess N/A 12/23/2014    Procedure: IRRIGATION AND DEBRIDEMENT ABSCESS/ PERINEAL WOUND DEBRIDMENT;  Surgeon: Marlyce Huge, MD;  Location: [facility] ORS;  Service: General;  Laterality: N/A;  . Irrigation and debridement abscess N/A 12/25/2014    Procedure: IRRIGATION AND DEBRIDEMENT ABSCESS;  Surgeon: Marlyce Huge, MD;  Location: [facility] OR[facility];  Service: General;  Laterality: N/A;  . Application of wound vac N/A 12/25/2014    Procedure: APPLICATION OF WOUND VAC;  Surgeon: Marlyce Huge, MD;  Location: [facility] OR[facility];  Service: General;  Laterality: N/A;  . Minor application of wound vac N/A 12/30/2014    Procedure: MINOR APPLICATION OF WOUND VAC;  Surgeon: [name], MD;  Location: [facility] ORS;  Service:  General;  Laterality: N/A;    FAMILY HISTORY Family History  Problem Relation Age of Onset  . Heart disease Mother   . Heart disease Father   . Cancer Father 11    Colon  . Multiple sclerosis Son        ADVANCED DIRECTIVES:    HEALTH MAINTENANCE: Social History  Substance Use Topics  . Smoking status: Never Smoker    . Smokeless tobacco: Never Used  . Alcohol Use: No     Colonoscopy:  PAP:  Bone density:  Lipid panel:  No Known Allergies  Current Outpatient Prescriptions  Medication Sig Dispense Refill  . acetaminophen (TYLENOL) 325 MG tablet Take 650 mg by mouth every 6 (six) hours as needed.    Marland Kitchen amLODipine (NORVASC) 2.5 MG tablet Take 1 tablet (2.5 mg total) by mouth daily. 90 tablet 3  . cholecalciferol (VITAMIN D) 1000 UNITS tablet Take 2,000 Units by mouth daily.     . ergocalciferol (VITAMIN D2) 50000 UNITS capsule Take 50,000 Units by mouth once a week.    . ferrous sulfate 325 (65 FE) MG tablet Take 325 mg by mouth 3 (three) times daily with meals.    . gabapentin (NEURONTIN) 100 MG capsule Take 1 capsule (100 mg total) by mouth at bedtime. 30 capsule 6  . glimepiride (AMARYL) 4 MG tablet Take 4 mg by mouth daily with breakfast.    . HYDROcodone-acetaminophen (NORCO/VICODIN) 5-325 MG per tablet Take 1 tablet by mouth every 6 (six) hours as needed for moderate pain. 30 tablet 0  . losartan (COZAAR) 100 MG tablet Take 1 tablet (100 mg total) by mouth daily. 90 tablet 3  . sennosides-docusate sodium (SENOKOT-S) 8.6-50 MG tablet Take 1 tablet by mouth daily.     Current Facility-Administered Medications  Medication Dose Route Frequency Provider Last Rate Last Dose  . enoxaparin (LOVENOX) injection 40 mg  40 mg Subcutaneous Q24H [name], MD        OBJECTIVE: Filed Vitals:   01/24/15 1027  BP: 90/54  Pulse: 85  Temp: 99.5 F (37.5 C)  Resp: 18     There is no weight on file to calculate BMI.    ECOG FS:2 - Symptomatic, <50% confined to bed  General: Well-developed, well-nourished, no acute distress. Eyes: anicteric sclera. HEENT: No palpable lymphadenopathy, mild fullness of left neck. Lungs: Clear to auscultation bilaterally. Heart: Regular rate and rhythm. No rubs, murmurs, or gallops. Abdomen: Soft, nontender, nondistended.  Colostomy noted. Musculoskeletal: No edema,  cyanosis, or clubbing. Neuro: Alert, answering all questions appropriately. Cranial nerves grossly intact. Skin: No rashes or petechiae noted. Psych: Normal affect.   LAB RESULTS:  Lab Results  Component Value Date   NA 133* 12/29/2014   K 4.8 12/29/2014   CL 105 12/29/2014   CO2 19* 12/29/2014   GLUCOSE 165* 12/29/2014   BUN 28* 12/29/2014   CREATININE 2.08* 12/29/2014   CALCIUM 7.8* 12/29/2014   PROT 4.8* 12/29/2014   ALBUMIN 1.9* 12/29/2014   AST 25 12/29/2014   ALT 25 12/29/2014   ALKPHOS 42 12/29/2014   BILITOT 0.3 12/29/2014   GFRNONAA 28* 12/29/2014   GFRAA 32* 12/29/2014    Lab Results  Component Value Date   WBC 10.2 12/26/2014   NEUTROABS 8.7* 12/26/2014   HGB 9.7* 12/26/2014   HCT 29.0* 12/26/2014   MCV 88.9 12/26/2014   PLT 283 12/26/2014     STUDIES: No results found.  ASSESSMENT: Stage IIa rectal cancer, PET positive oropharyngeal  PLAN:  1. Rectal cancer:  Patient has now underwent surgery with colostomy and multiple postoperative complications including a sacral decubitus ulcer. Given his multiple wound issues as well as decreased performance status, have decided that adjuvant chemotherapy  We would be more detrimental to the patient. No intervention is needed at this time. Proceed with routine surveillance. Return to clinic in 3 months with repeat laboratory work and further evaluation.  2. PET positive oropharyngeal region:   Monitor. Unclear etiology. Patient does report a history of a possible parotid stone several years ago. He is a nonsmoker, but does have a significant secondhand smoke history.  Patient expressed understanding and was in agreement with this plan. He also understands that He can call clinic at any time with any questions, concerns, or complaints.   Lloyd Huger, MD   01/24/2015 4:36 PM

## 2015-01-26 DIAGNOSIS — I10 Essential (primary) hypertension: Secondary | ICD-10-CM | POA: Diagnosis not present

## 2015-01-26 DIAGNOSIS — E119 Type 2 diabetes mellitus without complications: Secondary | ICD-10-CM | POA: Diagnosis not present

## 2015-01-26 DIAGNOSIS — C189 Malignant neoplasm of colon, unspecified: Secondary | ICD-10-CM | POA: Diagnosis not present

## 2015-01-26 DIAGNOSIS — G629 Polyneuropathy, unspecified: Secondary | ICD-10-CM | POA: Diagnosis not present

## 2015-01-28 DIAGNOSIS — N390 Urinary tract infection, site not specified: Secondary | ICD-10-CM | POA: Diagnosis not present

## 2015-01-29 ENCOUNTER — Telehealth: Payer: Self-pay

## 2015-01-29 NOTE — Telephone Encounter (Signed)
Please place an outside referral for wound care center for this patient.  He has a stage III - Large sacral decubitis ulcer as well as a rectal surgical wound that needs to be evaluated.

## 2015-01-30 NOTE — Telephone Encounter (Signed)
I have sent a referral through [facility] per the request from the [facility]. They will call the patient to make an appointment once the referral has been obtained.

## 2015-01-31 ENCOUNTER — Encounter: Payer: Self-pay | Admitting: Family Medicine

## 2015-01-31 DIAGNOSIS — L98499 Non-pressure chronic ulcer of skin of other sites with unspecified severity: Secondary | ICD-10-CM | POA: Diagnosis not present

## 2015-01-31 DIAGNOSIS — L98413 Non-pressure chronic ulcer of buttock with necrosis of muscle: Secondary | ICD-10-CM | POA: Diagnosis not present

## 2015-02-01 ENCOUNTER — Encounter: Payer: Self-pay | Admitting: Emergency Medicine

## 2015-02-01 ENCOUNTER — Other Ambulatory Visit: Payer: Medicare Other

## 2015-02-01 ENCOUNTER — Emergency Department: Payer: Medicare Other

## 2015-02-01 ENCOUNTER — Inpatient Hospital Stay
Admission: EM | Admit: 2015-02-01 | Discharge: 2015-02-07 | DRG: 356 | Disposition: A | Discharge status: Skilled Nursing Facility | Payer: Medicare Other | Attending: Surgery | Admitting: Surgery

## 2015-02-01 ENCOUNTER — Other Ambulatory Visit: Payer: Self-pay

## 2015-02-01 DIAGNOSIS — Z79891 Long term (current) use of opiate analgesic: Secondary | ICD-10-CM

## 2015-02-01 DIAGNOSIS — Z85048 Personal history of other malignant neoplasm of rectum, rectosigmoid junction, and anus: Secondary | ICD-10-CM | POA: Diagnosis not present

## 2015-02-01 DIAGNOSIS — E871 Hypo-osmolality and hyponatremia: Secondary | ICD-10-CM | POA: Diagnosis present

## 2015-02-01 DIAGNOSIS — N183 Chronic kidney disease, stage 3 (moderate): Secondary | ICD-10-CM | POA: Diagnosis present

## 2015-02-01 DIAGNOSIS — K651 Peritoneal abscess: Secondary | ICD-10-CM | POA: Diagnosis not present

## 2015-02-01 DIAGNOSIS — K589 Irritable bowel syndrome without diarrhea: Secondary | ICD-10-CM | POA: Diagnosis not present

## 2015-02-01 DIAGNOSIS — M1990 Unspecified osteoarthritis, unspecified site: Secondary | ICD-10-CM | POA: Diagnosis not present

## 2015-02-01 DIAGNOSIS — R627 Adult failure to thrive: Secondary | ICD-10-CM | POA: Diagnosis present

## 2015-02-01 DIAGNOSIS — N320 Bladder-neck obstruction: Secondary | ICD-10-CM | POA: Diagnosis present

## 2015-02-01 DIAGNOSIS — I129 Hypertensive chronic kidney disease with stage 1 through stage 4 chronic kidney disease, or unspecified chronic kidney disease: Secondary | ICD-10-CM | POA: Diagnosis not present

## 2015-02-01 DIAGNOSIS — Z96652 Presence of left artificial knee joint: Secondary | ICD-10-CM | POA: Diagnosis present

## 2015-02-01 DIAGNOSIS — E1142 Type 2 diabetes mellitus with diabetic polyneuropathy: Secondary | ICD-10-CM | POA: Diagnosis present

## 2015-02-01 DIAGNOSIS — N1330 Unspecified hydronephrosis: Secondary | ICD-10-CM | POA: Diagnosis not present

## 2015-02-01 DIAGNOSIS — E1165 Type 2 diabetes mellitus with hyperglycemia: Secondary | ICD-10-CM | POA: Diagnosis present

## 2015-02-01 DIAGNOSIS — Z9889 Other specified postprocedural states: Secondary | ICD-10-CM | POA: Diagnosis not present

## 2015-02-01 DIAGNOSIS — E1122 Type 2 diabetes mellitus with diabetic chronic kidney disease: Secondary | ICD-10-CM | POA: Diagnosis present

## 2015-02-01 DIAGNOSIS — N1339 Other hydronephrosis: Secondary | ICD-10-CM

## 2015-02-01 DIAGNOSIS — L89154 Pressure ulcer of sacral region, stage 4: Secondary | ICD-10-CM | POA: Diagnosis not present

## 2015-02-01 DIAGNOSIS — R2689 Other abnormalities of gait and mobility: Secondary | ICD-10-CM | POA: Diagnosis not present

## 2015-02-01 DIAGNOSIS — L02219 Cutaneous abscess of trunk, unspecified: Secondary | ICD-10-CM | POA: Diagnosis not present

## 2015-02-01 DIAGNOSIS — E119 Type 2 diabetes mellitus without complications: Secondary | ICD-10-CM | POA: Diagnosis not present

## 2015-02-01 DIAGNOSIS — R64 Cachexia: Secondary | ICD-10-CM | POA: Diagnosis not present

## 2015-02-01 DIAGNOSIS — I739 Peripheral vascular disease, unspecified: Secondary | ICD-10-CM | POA: Diagnosis not present

## 2015-02-01 DIAGNOSIS — C61 Malignant neoplasm of prostate: Secondary | ICD-10-CM | POA: Diagnosis not present

## 2015-02-01 DIAGNOSIS — E785 Hyperlipidemia, unspecified: Secondary | ICD-10-CM | POA: Diagnosis present

## 2015-02-01 DIAGNOSIS — G8929 Other chronic pain: Secondary | ICD-10-CM | POA: Diagnosis present

## 2015-02-01 DIAGNOSIS — E11649 Type 2 diabetes mellitus with hypoglycemia without coma: Secondary | ICD-10-CM | POA: Diagnosis not present

## 2015-02-01 DIAGNOSIS — Z22322 Carrier or suspected carrier of Methicillin resistant Staphylococcus aureus: Secondary | ICD-10-CM

## 2015-02-01 DIAGNOSIS — E875 Hyperkalemia: Secondary | ICD-10-CM | POA: Diagnosis present

## 2015-02-01 DIAGNOSIS — L03319 Cellulitis of trunk, unspecified: Secondary | ICD-10-CM

## 2015-02-01 DIAGNOSIS — L89152 Pressure ulcer of sacral region, stage 2: Secondary | ICD-10-CM | POA: Diagnosis not present

## 2015-02-01 DIAGNOSIS — N179 Acute kidney failure, unspecified: Secondary | ICD-10-CM | POA: Diagnosis present

## 2015-02-01 DIAGNOSIS — N189 Chronic kidney disease, unspecified: Secondary | ICD-10-CM | POA: Diagnosis not present

## 2015-02-01 DIAGNOSIS — Z791 Long term (current) use of non-steroidal anti-inflammatories (NSAID): Secondary | ICD-10-CM

## 2015-02-01 DIAGNOSIS — Z79899 Other long term (current) drug therapy: Secondary | ICD-10-CM | POA: Diagnosis not present

## 2015-02-01 DIAGNOSIS — I959 Hypotension, unspecified: Secondary | ICD-10-CM | POA: Diagnosis present

## 2015-02-01 DIAGNOSIS — N400 Enlarged prostate without lower urinary tract symptoms: Secondary | ICD-10-CM | POA: Diagnosis present

## 2015-02-01 DIAGNOSIS — Z8546 Personal history of malignant neoplasm of prostate: Secondary | ICD-10-CM | POA: Diagnosis not present

## 2015-02-01 DIAGNOSIS — L8990 Pressure ulcer of unspecified site, unspecified stage: Secondary | ICD-10-CM | POA: Diagnosis not present

## 2015-02-01 DIAGNOSIS — R251 Tremor, unspecified: Secondary | ICD-10-CM | POA: Diagnosis not present

## 2015-02-01 DIAGNOSIS — C20 Malignant neoplasm of rectum: Secondary | ICD-10-CM | POA: Diagnosis not present

## 2015-02-01 DIAGNOSIS — D500 Iron deficiency anemia secondary to blood loss (chronic): Secondary | ICD-10-CM | POA: Diagnosis present

## 2015-02-01 DIAGNOSIS — E43 Unspecified severe protein-calorie malnutrition: Secondary | ICD-10-CM | POA: Diagnosis present

## 2015-02-01 DIAGNOSIS — J069 Acute upper respiratory infection, unspecified: Secondary | ICD-10-CM | POA: Diagnosis not present

## 2015-02-01 DIAGNOSIS — G629 Polyneuropathy, unspecified: Secondary | ICD-10-CM

## 2015-02-01 DIAGNOSIS — M6281 Muscle weakness (generalized): Secondary | ICD-10-CM | POA: Diagnosis not present

## 2015-02-01 DIAGNOSIS — Z86711 Personal history of pulmonary embolism: Secondary | ICD-10-CM | POA: Diagnosis not present

## 2015-02-01 DIAGNOSIS — K650 Generalized (acute) peritonitis: Secondary | ICD-10-CM

## 2015-02-01 DIAGNOSIS — Z66 Do not resuscitate: Secondary | ICD-10-CM | POA: Diagnosis not present

## 2015-02-01 DIAGNOSIS — T814XXD Infection following a procedure, subsequent encounter: Secondary | ICD-10-CM | POA: Diagnosis not present

## 2015-02-01 DIAGNOSIS — M25561 Pain in right knee: Secondary | ICD-10-CM | POA: Diagnosis present

## 2015-02-01 DIAGNOSIS — E1140 Type 2 diabetes mellitus with diabetic neuropathy, unspecified: Secondary | ICD-10-CM | POA: Diagnosis not present

## 2015-02-01 DIAGNOSIS — Z4801 Encounter for change or removal of surgical wound dressing: Secondary | ICD-10-CM | POA: Diagnosis not present

## 2015-02-01 DIAGNOSIS — I10 Essential (primary) hypertension: Secondary | ICD-10-CM | POA: Diagnosis present

## 2015-02-01 DIAGNOSIS — E860 Dehydration: Secondary | ICD-10-CM | POA: Diagnosis present

## 2015-02-01 DIAGNOSIS — E162 Hypoglycemia, unspecified: Secondary | ICD-10-CM | POA: Diagnosis present

## 2015-02-01 DIAGNOSIS — R05 Cough: Secondary | ICD-10-CM | POA: Diagnosis not present

## 2015-02-01 DIAGNOSIS — L0291 Cutaneous abscess, unspecified: Secondary | ICD-10-CM

## 2015-02-01 DIAGNOSIS — L89150 Pressure ulcer of sacral region, unstageable: Secondary | ICD-10-CM | POA: Diagnosis not present

## 2015-02-01 DIAGNOSIS — R531 Weakness: Secondary | ICD-10-CM | POA: Diagnosis not present

## 2015-02-01 DIAGNOSIS — D649 Anemia, unspecified: Secondary | ICD-10-CM | POA: Diagnosis not present

## 2015-02-01 DIAGNOSIS — R1084 Generalized abdominal pain: Secondary | ICD-10-CM | POA: Diagnosis not present

## 2015-02-01 DIAGNOSIS — J449 Chronic obstructive pulmonary disease, unspecified: Secondary | ICD-10-CM | POA: Diagnosis not present

## 2015-02-01 DIAGNOSIS — Z515 Encounter for palliative care: Secondary | ICD-10-CM | POA: Diagnosis not present

## 2015-02-01 LAB — URINALYSIS COMPLETE WITH MICROSCOPIC (ARMC ONLY)
Bilirubin Urine: NEGATIVE
Glucose, UA: NEGATIVE mg/dL
HGB URINE DIPSTICK: NEGATIVE
Ketones, ur: NEGATIVE mg/dL
Leukocytes, UA: NEGATIVE
NITRITE: NEGATIVE
PROTEIN: NEGATIVE mg/dL
RBC / HPF: NONE SEEN RBC/hpf (ref 0–5)
SPECIFIC GRAVITY, URINE: 1.016 (ref 1.005–1.030)
Squamous Epithelial / LPF: NONE SEEN
pH: 5 (ref 5.0–8.0)

## 2015-02-01 LAB — CBC WITH DIFFERENTIAL/PLATELET
BASOS PCT: 0 %
Basophils Absolute: 0 10*3/uL (ref 0–0.1)
Eosinophils Absolute: 0.1 10*3/uL (ref 0–0.7)
Eosinophils Relative: 0 %
HCT: 26.8 % — ABNORMAL LOW (ref 40.0–52.0)
HEMOGLOBIN: 8.5 g/dL — AB (ref 13.0–18.0)
LYMPHS ABS: 0.7 10*3/uL — AB (ref 1.0–3.6)
Lymphocytes Relative: 3 %
MCH: 26.9 pg (ref 26.0–34.0)
MCHC: 31.8 g/dL — ABNORMAL LOW (ref 32.0–36.0)
MCV: 84.4 fL (ref 80.0–100.0)
Monocytes Absolute: 1 10*3/uL (ref 0.2–1.0)
Monocytes Relative: 4 %
NEUTROS ABS: 21.6 10*3/uL — AB (ref 1.4–6.5)
NEUTROS PCT: 93 %
Platelets: 424 10*3/uL (ref 150–440)
RBC: 3.17 MIL/uL — ABNORMAL LOW (ref 4.40–5.90)
RDW: 16.1 % — ABNORMAL HIGH (ref 11.5–14.5)
WBC: 23.5 10*3/uL — ABNORMAL HIGH (ref 3.8–10.6)

## 2015-02-01 LAB — TROPONIN I

## 2015-02-01 LAB — COMPREHENSIVE METABOLIC PANEL
ALT: 88 U/L — AB (ref 17–63)
AST: 95 U/L — ABNORMAL HIGH (ref 15–41)
Albumin: 2 g/dL — ABNORMAL LOW (ref 3.5–5.0)
Alkaline Phosphatase: 78 U/L (ref 38–126)
Anion gap: 7 (ref 5–15)
BUN: 65 mg/dL — AB (ref 6–20)
CHLORIDE: 103 mmol/L (ref 101–111)
CO2: 21 mmol/L — AB (ref 22–32)
CREATININE: 2.02 mg/dL — AB (ref 0.61–1.24)
Calcium: 8.5 mg/dL — ABNORMAL LOW (ref 8.9–10.3)
GFR calc Af Amer: 33 mL/min — ABNORMAL LOW (ref >60)
GFR calc non Af Amer: 29 mL/min — ABNORMAL LOW (ref >60)
Glucose, Bld: 132 mg/dL — ABNORMAL HIGH (ref 65–99)
Potassium: 5.4 mmol/L — ABNORMAL HIGH (ref 3.5–5.1)
SODIUM: 131 mmol/L — AB (ref 135–145)
Total Bilirubin: 0.4 mg/dL (ref 0.3–1.2)
Total Protein: 6.1 g/dL — ABNORMAL LOW (ref 6.5–8.1)

## 2015-02-01 LAB — GLUCOSE, CAPILLARY: GLUCOSE-CAPILLARY: 86 mg/dL (ref 65–99)

## 2015-02-01 LAB — LIPASE, BLOOD: Lipase: 28 U/L (ref 22–51)

## 2015-02-01 LAB — LACTIC ACID, PLASMA: Lactic Acid, Venous: 2.3 mmol/L (ref 0.5–2.0)

## 2015-02-01 MED ORDER — SODIUM CHLORIDE 0.9 % IV SOLN
Freq: Once | INTRAVENOUS | Status: AC
  Administered 2015-02-01: 21:00:00 via INTRAVENOUS

## 2015-02-01 MED ORDER — SODIUM CHLORIDE 0.9 % IV SOLN
Freq: Once | INTRAVENOUS | Status: AC
  Administered 2015-02-01: 1000 mL via INTRAVENOUS

## 2015-02-01 MED ORDER — HYDROCODONE-ACETAMINOPHEN 5-325 MG PO TABS
1.0000 | ORAL_TABLET | Freq: Four times a day (QID) | ORAL | Status: DC | PRN
  Administered 2015-02-03 – 2015-02-05 (×3): 1 via ORAL
  Filled 2015-02-01 (×3): qty 1

## 2015-02-01 MED ORDER — SODIUM CHLORIDE 0.9 % IV SOLN
1.0000 g | INTRAVENOUS | Status: DC
  Administered 2015-02-02 – 2015-02-04 (×4): 1 g via INTRAVENOUS
  Filled 2015-02-01 (×6): qty 1

## 2015-02-01 MED ORDER — IOHEXOL 240 MG/ML SOLN
25.0000 mL | INTRAMUSCULAR | Status: AC
  Administered 2015-02-01 (×2): 25 mL via ORAL

## 2015-02-01 MED ORDER — ONDANSETRON HCL 4 MG/2ML IJ SOLN
4.0000 mg | Freq: Four times a day (QID) | INTRAMUSCULAR | Status: DC | PRN
  Administered 2015-02-05: 4 mg via INTRAVENOUS

## 2015-02-01 MED ORDER — VANCOMYCIN HCL IN DEXTROSE 1-5 GM/200ML-% IV SOLN
1000.0000 mg | Freq: Once | INTRAVENOUS | Status: AC
  Administered 2015-02-01: 1000 mg via INTRAVENOUS
  Filled 2015-02-01: qty 200

## 2015-02-01 MED ORDER — ONDANSETRON 4 MG PO TBDP: 4.0000 mg | ORAL_TABLET | Freq: Four times a day (QID) | ORAL | Status: DC | PRN

## 2015-02-01 MED ORDER — ENOXAPARIN SODIUM 40 MG/0.4ML ~~LOC~~ SOLN
40.0000 mg | SUBCUTANEOUS | Status: DC
  Administered 2015-02-02 – 2015-02-06 (×6): 40 mg via SUBCUTANEOUS
  Filled 2015-02-01 (×7): qty 0.4

## 2015-02-01 MED ORDER — LOSARTAN POTASSIUM 25 MG PO TABS: 100.0000 mg | ORAL_TABLET | Freq: Every day | ORAL | Status: DC

## 2015-02-01 MED ORDER — PANTOPRAZOLE SODIUM 40 MG IV SOLR
40.0000 mg | Freq: Every day | INTRAVENOUS | Status: DC
  Administered 2015-02-02 (×2): 40 mg via INTRAVENOUS
  Filled 2015-02-01 (×2): qty 40

## 2015-02-01 MED ORDER — INSULIN ASPART 100 UNIT/ML ~~LOC~~ SOLN
0.0000 [IU] | Freq: Four times a day (QID) | SUBCUTANEOUS | Status: DC
  Administered 2015-02-02: 1 [IU] via SUBCUTANEOUS
  Administered 2015-02-02: 3 [IU] via SUBCUTANEOUS
  Administered 2015-02-03: 2 [IU] via SUBCUTANEOUS
  Administered 2015-02-04: 1 [IU] via SUBCUTANEOUS
  Administered 2015-02-04: 2 [IU] via SUBCUTANEOUS
  Administered 2015-02-04: 1 [IU] via SUBCUTANEOUS
  Filled 2015-02-01 (×2): qty 2
  Filled 2015-02-01 (×3): qty 1
  Filled 2015-02-01: qty 3

## 2015-02-01 MED ORDER — SODIUM CHLORIDE 0.9 % IV SOLN
INTRAVENOUS | Status: DC
  Administered 2015-02-02 (×2): via INTRAVENOUS

## 2015-02-01 MED ORDER — GLIMEPIRIDE 4 MG PO TABS: 4.0000 mg | ORAL_TABLET | Freq: Every day | ORAL | Status: DC

## 2015-02-01 MED ORDER — FERROUS SULFATE 325 (65 FE) MG PO TABS
325.0000 mg | ORAL_TABLET | Freq: Three times a day (TID) | ORAL | Status: DC
  Administered 2015-02-02 – 2015-02-07 (×13): 325 mg via ORAL
  Filled 2015-02-01 (×14): qty 1

## 2015-02-01 MED ORDER — AMLODIPINE BESYLATE 5 MG PO TABS: 2.5000 mg | ORAL_TABLET | Freq: Every day | ORAL | Status: DC

## 2015-02-01 MED ORDER — MORPHINE SULFATE (PF) 4 MG/ML IV SOLN
4.0000 mg | INTRAVENOUS | Status: DC | PRN
  Administered 2015-02-02 – 2015-02-03 (×2): 4 mg via INTRAVENOUS
  Filled 2015-02-01 (×2): qty 1

## 2015-02-01 MED ORDER — ACETAMINOPHEN 325 MG PO TABS
650.0000 mg | ORAL_TABLET | Freq: Four times a day (QID) | ORAL | Status: DC | PRN
  Administered 2015-02-02: 650 mg via ORAL
  Filled 2015-02-01: qty 2

## 2015-02-01 MED ORDER — GABAPENTIN 100 MG PO CAPS
100.0000 mg | ORAL_CAPSULE | Freq: Every day | ORAL | Status: DC
  Administered 2015-02-02 – 2015-02-06 (×6): 100 mg via ORAL
  Filled 2015-02-01 (×6): qty 1

## 2015-02-01 MED ORDER — PIPERACILLIN-TAZOBACTAM 3.375 G IVPB
3.3750 g | Freq: Once | INTRAVENOUS | Status: AC
  Administered 2015-02-01: 3.375 g via INTRAVENOUS
  Filled 2015-02-01: qty 50

## 2015-02-01 NOTE — ED Notes (Signed)
Pt here via EMS with c/o cold symptoms, family arrived at [facility] today and pt was shaking,  c/o abd pain, coughing up phlegm, has pressure ulcer on backside per family, colostomy intact, pt alert and oriented, however whimpering/crying upon initial assessment.

## 2015-02-01 NOTE — H&P (Signed)
Darryl Hooper. is a 81 y.o. male  recent seen in the office and now readmitted through the emergency room with fever and chills and failure to thrive  HPI: He underwent an abdominal perineal resection in mid July for a low rectal carcinoma. The procedure was complicated by multiple postoperative problems. He has been recovering in one of the nursing home rehabilitation facilities. He was seen in the office last week for wound management and has developed a large sacral decubitus. He has not been eating well and operative spanning aggressively in physical therapy. The family visited this evening and noted that he has significant shaking chill was febrile lethargic and poorly responsive. He's also been coughing significant amounts of phlegm. His appetite has not been good and has not been eating normally. He has had good ostomy function.  In the emergency room was white blood cell count was noted to be 23 2000 and CT scan demonstrated a 4 x 4 centimeter pelvic abscess in the site of his rectal excision. He also had a massively distended bladder with significant hydronephrosis on the right side. Surgical service was consulted.  Family relates that over the last couple weeks he slowly been declining. One of my associates had a discussion with the family patient about their expectations. At the present time they feel he can recover to his presurgery condition and they want to pursue every option available to them.  Past Medical History  Diagnosis Date  . Hypertension   . Peripheral vascular disease   . Hyperlipidemia   . Diarrhea   . Diabetes mellitus without complication   . BPH (benign prostatic hyperplasia)   . Prostate cancer   . Arthritis   . Knee pain, bilateral   . Rectal cancer   . Chronic kidney disease     incontience  . IBS (irritable bowel syndrome)   . Neuropathy due to secondary diabetes mellitus   . Hx of pulmonary embolus     2011--treated with Coumadin   Past Surgical History   Procedure Laterality Date  . Prostatectomy    . Joint replacement      left knee  . Colonoscopy N/A 10/23/2014    Procedure: COLONOSCOPY;  Surgeon: Lucilla Lame, MD;  Location: [facility];  Service: Gastroenterology;  Laterality: N/A;  . Knee arthroscopy w/ meniscal repair Left 2012  . Eus N/A 11/14/2014    Procedure: LOWER ENDOSCOPIC ULTRASOUND (EUS);  Surgeon: Holly Bodily, MD;  Location: [facility] ENDOSCOPY;  Service: Endoscopy;  Laterality: N/A;  . Eye surgery Bilateral     cataract extraction  . Tonsillectomy    . Abdominal perineal bowel resection N/A 12/12/2014    Procedure: ABDOMINAL PERINEAL RESECTION;  Surgeon: [name], MD;  Location: [facility];  Service: General;  Laterality: N/A;  . Cystoscopy with stent placement Bilateral 12/12/2014    Procedure: cystoscopy, bilateral retrogrades, left ureteral stent placement, attempted right stent placement.;  Surgeon: Hollice Espy, MD;  Location: [facility];  Service: Urology;  Laterality: Bilateral;  . Irrigation and debridement abscess N/A 12/23/2014    Procedure: IRRIGATION AND DEBRIDEMENT ABSCESS/ PERINEAL WOUND DEBRIDMENT;  Surgeon: Marlyce Huge, MD;  Location: [facility];  Service: General;  Laterality: N/A;  . Irrigation and debridement abscess N/A 12/25/2014    Procedure: IRRIGATION AND DEBRIDEMENT ABSCESS;  Surgeon: Marlyce Huge, MD;  Location: [facility];  Service: General;  Laterality: N/A;  . Application of wound vac N/A 12/25/2014    Procedure: APPLICATION OF WOUND VAC;  Surgeon: Harrell Gave  Lundquist, MD;  Location: [facility];  Service: General;  Laterality: N/A;  . Minor application of wound vac N/A 12/30/2014    Procedure: MINOR APPLICATION OF WOUND VAC;  Surgeon: [name], MD;  Location: [facility];  Service: General;  Laterality: N/A;   Social History   Social History  . Marital Status: Married    Spouse Name: N/A  . Number of Children: N/A  . Years of Education: N/A   Social History Main Topics  .  Smoking status: Never Smoker   . Smokeless tobacco: Never Used  . Alcohol Use: No  . Drug Use: No  . Sexual Activity: No   Other Topics Concern  . None   Social History Narrative     Review of Systems  Constitutional: Positive for fever, chills, malaise/fatigue and diaphoresis. Negative for weight loss.  HENT: Negative.   Eyes: Negative.   Respiratory: Positive for cough and sputum production. Negative for shortness of breath and wheezing.   Cardiovascular: Positive for chest pain and leg swelling. Negative for palpitations and orthopnea.  Gastrointestinal: Positive for heartburn and abdominal pain.  Genitourinary:       Voiding in small amounts on multiple occasions. He is often incontinent.  Musculoskeletal: Negative.   Skin: Negative.   Neurological: Positive for weakness.  Psychiatric/Behavioral: Positive for depression.     PHYSICAL EXAM: BP 104/60 mmHg  Pulse 69  Temp(Src) 97.9 F (36.6 C) (Oral)  Resp 19  Ht 6\' 2"  (1.88 m)  Wt 215 lb (97.523 kg)  BMI 27.59 kg/m2  SpO2 100%  Physical Exam  Constitutional: He is oriented to person, place, and time. He appears well-developed. He appears distressed.  HENT:  Head: Normocephalic and atraumatic.  Eyes: EOM are normal. Pupils are equal, round, and reactive to light.  Neck: Normal range of motion. Neck supple.  Cardiovascular: Regular rhythm and normal heart sounds.   Pulmonary/Chest: Effort normal and breath sounds normal. No respiratory distress. He has no wheezes. He has no rales.  Abdominal: Soft. Bowel sounds are normal.  Genitourinary:  Large defect at the site of his rectal closure which is packed open appears to granulating well. He has a large sacral decubitus grade 3 that appears to be relatively well debridement. It does have some obvious odor.  Musculoskeletal: Normal range of motion. He exhibits edema. He exhibits no tenderness.  Neurological: He is oriented to person, place, and time.  Lethargic  Skin:  Skin is warm. No erythema.  Psychiatric: Judgment and thought content normal.   This abdomen is unremarkable. His ostomy appears to be healing well. There is no sign of any ostomy problem. I cannot palpate any lower abdominal tenderness. He does have large open wound in his perineal area which likely communicates or is adjacent to the current abscess.  Impression/Plan: We will readmit him to the hospital place him on IV antibiotics IV fluid and ask internal medicine to assist with his medical problems. He will likely need a drainage of this pelvic abscess. Percutaneous drainage would be ideal but I do not think there is an obvious path to this abscess. He may need to have an intraoperative drainage through the rectal defect. I discussed this plan with the patient is wife and daughter at length. They're in agreement.   Dia Crawford III, MD  02/01/2015, 7:59 PM

## 2015-02-01 NOTE — ED Notes (Signed)
Patient returned from [facility]. Placed back on monitor. Resting comfortably. Family at bedside.

## 2015-02-01 NOTE — ED Notes (Signed)
Patient to CT via stretcher.

## 2015-02-01 NOTE — ED Notes (Signed)
Patient completed contrast, CT notified.

## 2015-02-01 NOTE — Consult Note (Signed)
[facility] NAME: Darryl Hooper    MR#:  [id number]  DATE OF BIRTH:  1930-04-03  DATE OF ADMISSION:  02/01/2015  PRIMARY CARE PHYSICIAN: Dicky Doe, MD   REQUESTING/REFERRING PHYSICIAN: Pat Patrick, M.D.  CHIEF COMPLAINT:   Chief Complaint  Patient presents with  . Failure To Thrive  . URI    HISTORY OF PRESENT ILLNESS:  Darryl Hooper  is a 82 y.o. male who presents with increasing generalized weakness and failure to thrive as well as sacral ulcer. Patient is admitted to the surgical service due to a pelvic abscess. He is admitted with IV antibiotics and will have a possible surgical intervention for drainage of the abscess. Hospitalists were consult in to assist in medical management.  PAST MEDICAL HISTORY:   Past Medical History  Diagnosis Date  . Hypertension   . Peripheral vascular disease   . Hyperlipidemia   . Diarrhea   . Diabetes mellitus without complication   . BPH (benign prostatic hyperplasia)   . Prostate cancer   . Arthritis   . Knee pain, bilateral   . Rectal cancer   . Chronic kidney disease     incontience  . IBS (irritable bowel syndrome)   . Neuropathy due to secondary diabetes mellitus   . Hx of pulmonary embolus     2011--treated with Coumadin    PAST SURGICAL HISTOIRY:   Past Surgical History  Procedure Laterality Date  . Prostatectomy    . Joint replacement      left knee  . Colonoscopy N/A 10/23/2014    Procedure: COLONOSCOPY;  Surgeon: Lucilla Lame, MD;  Location: [facility];  Service: Gastroenterology;  Laterality: N/A;  . Knee arthroscopy w/ meniscal repair Left 2012  . Eus N/A 11/14/2014    Procedure: LOWER ENDOSCOPIC ULTRASOUND (EUS);  Surgeon: Holly Bodily, MD;  Location: [facility] ENDOSCOPY;  Service: Endoscopy;  Laterality: N/A;  . Eye surgery Bilateral     cataract extraction  . Tonsillectomy    . Abdominal perineal bowel resection N/A 12/12/2014    Procedure:  ABDOMINAL PERINEAL RESECTION;  Surgeon: [name], MD;  Location: [facility] ORS;  Service: General;  Laterality: N/A;  . Cystoscopy with stent placement Bilateral 12/12/2014    Procedure: cystoscopy, bilateral retrogrades, left ureteral stent placement, attempted right stent placement.;  Surgeon: Hollice Espy, MD;  Location: [facility];  Service: Urology;  Laterality: Bilateral;  . Irrigation and debridement abscess N/A 12/23/2014    Procedure: IRRIGATION AND DEBRIDEMENT ABSCESS/ PERINEAL WOUND DEBRIDMENT;  Surgeon: Marlyce Huge, MD;  Location: [facility];  Service: General;  Laterality: N/A;  . Irrigation and debridement abscess N/A 12/25/2014    Procedure: IRRIGATION AND DEBRIDEMENT ABSCESS;  Surgeon: Marlyce Huge, MD;  Location: [facility];  Service: General;  Laterality: N/A;  . Application of wound vac N/A 12/25/2014    Procedure: APPLICATION OF WOUND VAC;  Surgeon: Marlyce Huge, MD;  Location: [facility];  Service: General;  Laterality: N/A;  . Minor application of wound vac N/A 12/30/2014    Procedure: MINOR APPLICATION OF WOUND VAC;  Surgeon: [name], MD;  Location: [facility];  Service: General;  Laterality: N/A;    SOCIAL HISTORY:   Social History  Substance Use Topics  . Smoking status: Never Smoker   . Smokeless tobacco: Never Used  . Alcohol Use: No    FAMILY HISTORY:   Family History  Problem Relation Age of Onset  . Heart disease Mother   .  Heart disease Father   . Cancer Father 85    Colon  . Multiple sclerosis Son     DRUG ALLERGIES:  No Known Allergies  REVIEW OF SYSTEMS:  Review of Systems  Constitutional: Positive for malaise/fatigue. Negative for fever, chills and weight loss.  HENT: Negative for ear pain, hearing loss and tinnitus.   Eyes: Negative for blurred vision, double vision, pain and redness.  Respiratory: Negative for cough, hemoptysis and shortness of breath.   Cardiovascular: Negative for chest pain, palpitations, orthopnea and leg  swelling.  Gastrointestinal: Negative for nausea, vomiting, abdominal pain, diarrhea and constipation.  Genitourinary: Negative for dysuria, frequency and hematuria.  Musculoskeletal: Negative for back pain, joint pain and neck pain.       Sacral pain with decubitus ulcer  Skin:       No acne, rash, or lesions  Neurological: Positive for weakness. Negative for dizziness, tremors and focal weakness.  Endo/Heme/Allergies: Negative for polydipsia. Does not bruise/bleed easily.  Psychiatric/Behavioral: Negative for depression. The patient is not nervous/anxious and does not have insomnia.     MEDICATIONS AT HOME:   Prior to Admission medications   Medication Sig Start Date End Date Taking? Authorizing Provider  acetaminophen (TYLENOL) 650 MG CR tablet Take 650 mg by mouth 2 (two) times daily.   Yes Historical Provider, MD  Amino Acids-Protein Hydrolys (FEEDING SUPPLEMENT, PRO-STAT SUGAR FREE 64,) LIQD Take 30 mLs by mouth 3 (three) times daily.   Yes Historical Provider, MD  amLODipine (NORVASC) 2.5 MG tablet Take 1 tablet (2.5 mg total) by mouth daily. 12/31/14  Yes [name], MD  Cholecalciferol (VITAMIN D-3) 1000 UNITS CAPS Take 1,000 Units by mouth daily.   Yes Historical Provider, MD  ferrous sulfate 325 (65 FE) MG tablet Take 325 mg by mouth 3 (three) times daily.    Yes Historical Provider, MD  gabapentin (NEURONTIN) 100 MG capsule Take 300 mg by mouth at bedtime.   Yes Historical Provider, MD  glimepiride (AMARYL) 4 MG tablet Take 4 mg by mouth daily.    Yes Historical Provider, MD  HYDROcodone-acetaminophen (NORCO/VICODIN) 5-325 MG per tablet Take 1 tablet by mouth every 6 (six) hours as needed for moderate pain. Patient taking differently: Take 1 tablet by mouth every 4 (four) hours as needed for moderate pain.  12/31/14  Yes [name], MD  levofloxacin (LEVAQUIN) 250 MG tablet Take 250 mg by mouth daily. 01/27/15 02/03/15 Yes Historical Provider, MD  losartan (COZAAR) 100 MG tablet Take 1  tablet (100 mg total) by mouth daily. 12/31/14  Yes [name], MD  Multiple Vitamin (MULTIVITAMIN) tablet Take 1 tablet by mouth daily.   Yes Historical Provider, MD  sennosides-docusate sodium (SENOKOT-S) 8.6-50 MG tablet Take 1 tablet by mouth at bedtime.    Yes Historical Provider, MD  vitamin C (ASCORBIC ACID) 500 MG tablet Take 500 mg by mouth 2 (two) times daily.   Yes Historical Provider, MD  zinc sulfate 220 MG capsule Take 220 mg by mouth every other day. 01/28/15 02/11/15 Yes Historical Provider, MD      VITAL SIGNS:   Filed Vitals:   02/01/15 1930 02/01/15 2000 02/01/15 2015 02/01/15 2038  BP: 104/60 101/52  95/52  Pulse: 69 62 59 63  Temp:      TempSrc:      Resp: 19 19 17 19   Height:      Weight:      SpO2: 100% 100% 100% 100%   Wt Readings from Last 3 Encounters:  02/01/15 97.523 kg (215 lb)  01/16/15 107.049 kg (236 lb)  12/31/14 107.14 kg (236 lb 3.2 oz)    PHYSICAL EXAMINATION:  Physical Exam  Vitals reviewed. Constitutional: He is oriented to person, place, and time. He appears well-developed. No distress.  HENT:  Head: Normocephalic and atraumatic.  Mouth/Throat: Oropharynx is clear and moist.  Eyes: Conjunctivae and EOM are normal. Pupils are equal, round, and reactive to light. No scleral icterus.  Neck: Normal range of motion. Neck supple. No JVD present. No thyromegaly present.  Cardiovascular: Normal rate, regular rhythm and intact distal pulses.  Exam reveals no gallop and no friction rub.   No murmur heard. Respiratory: Effort normal and breath sounds normal. No respiratory distress. He has no wheezes. He has no rales.  GI: Soft. Bowel sounds are normal. He exhibits no distension. There is no tenderness.  Musculoskeletal: Normal range of motion. He exhibits edema (1+ bilateral lower extremity).  No arthritis, no gout  Lymphadenopathy:    He has no cervical adenopathy.  Neurological: He is alert and oriented to person, place, and time. No cranial nerve  deficit.  No dysarthria, no aphasia  Skin: Skin is warm and dry. No rash noted.  Sacral decubitus ulcer  Psychiatric: He has a normal mood and affect. His behavior is normal. Judgment and thought content normal.     LABORATORY PANEL:   CBC  Recent Labs Lab 02/01/15 1536  WBC 23.5*  HGB 8.5*  HCT 26.8*  PLT 424   ------------------------------------------------------------------------------------------------------------------  Chemistries   Recent Labs Lab 02/01/15 1536  NA 131*  K 5.4*  CL 103  CO2 21*  GLUCOSE 132*  BUN 65*  CREATININE 2.02*  CALCIUM 8.5*  AST 95*  ALT 88*  ALKPHOS 78  BILITOT 0.4   ------------------------------------------------------------------------------------------------------------------  Cardiac Enzymes  Recent Labs Lab 02/01/15 1536  TROPONINI <0.03   ------------------------------------------------------------------------------------------------------------------  RADIOLOGY:  Ct Abdomen Pelvis Wo Contrast  02/01/2015   CLINICAL DATA:  Acute onset of generalized abdominal pain and cough. Pressure ulcer along the back. Shaking. Initial encounter.  EXAM: CT CHEST, ABDOMEN AND PELVIS WITHOUT CONTRAST  TECHNIQUE: Multidetector CT imaging of the chest, abdomen and pelvis was performed following the standard protocol without IV contrast.  COMPARISON:  PET/CT performed 11/08/2014  FINDINGS: CT CHEST FINDINGS  Minimal bibasilar atelectasis is noted. The lungs are otherwise clear. No focal consolidation, pleural effusion or pneumothorax is seen. No masses are identified.  Scattered coronary artery calcifications are seen. Scattered calcification is noted at the mitral and aortic valves. The mediastinum is otherwise unremarkable. No mediastinal lymphadenopathy is seen. No pericardial effusion is seen. The great vessels are grossly unremarkable. The thyroid gland is unremarkable in appearance. No axillary lymphadenopathy is appreciated.  No acute  osseous abnormalities are identified.  CT ABDOMEN AND PELVIS FINDINGS  The liver and spleen are unremarkable in appearance. The gallbladder is within normal limits. The pancreas and adrenal glands are unremarkable.  There is moderate right-sided hydronephrosis, new from the prior study. Nonspecific perinephric stranding is noted bilaterally, and left renal atrophy and scarring are seen. No obstructing ureteral stone is seen; this may reflect a recently passed stone. A nonobstructing 3 mm stone is noted at the interpole region of the right kidney.  No free fluid is identified. The small bowel is unremarkable in appearance. The stomach is within normal limits. No acute vascular abnormalities are seen. Scattered calcification is noted along the abdominal aorta and its branches.  The appendix is not definitely seen; there  is no evidence for appendicitis. The patient is status post resection of the distal sigmoid colon and rectum, with a left lower quadrant colostomy. The colostomy is unremarkable in appearance.  There is a 5.9 x 3.1 cm collection of fluid and air at the presacral space, abutting the sacrum and coccyx, and compatible with an abscess. This appears somewhat contiguous with the large sacral decubitus ulceration, which measures approximately 4.0 cm in depth and 11.9 cm in length, with a defect in the coccyx at its superior edge. Underlying osteomyelitis is a concern. Surrounding soft tissue inflammation extends within the lower pelvis.  There appears to be a residual anterior soft tissue wound, with minimal air tracking superiorly and laterally along the fascia. A nearby collection of fluid along the right mid anterior abdominal wall, measuring 4.0 x 1.5 cm, may reflect recent injury or possibly a postoperative seroma.  The bladder is moderately distended and grossly unremarkable. Scattered prostate brachytherapy seeds are seen. No inguinal lymphadenopathy is seen.  No acute osseous abnormalities are  identified. Multilevel degenerative change is noted along the lower lumbar spine, with chronic bilateral pars defects at L5, and mild grade 1 anterolisthesis of L5 on S1.  IMPRESSION: 1. Status post resection of the distal sigmoid colon and rectum, there is a 5.9 x 3.1 cm collection of fluid and air at the presacral space, abutting the sacrum and coccyx, compatible with an abscess. This appears somewhat contiguous with the large sacral decubitus ulceration, which measures 4.0 cm in depth and 11.9 cm in length, with a defect in the coccyx at its superior edge. Underlying osteomyelitis is a concern, but is not well assessed on CT. Surrounding soft tissue inflammation within the lower pelvis. 2. Moderate right-sided hydronephrosis, new from the prior study. No obstructing ureteral stone. This may reflect a recently passed stone, as there is no additional finding seen to suggest obstruction. Would correlate with the patient's symptoms. 3. Residual anterior soft tissue wound, with minimal associated air tracking superiorly and laterally along the anterior abdominal wall fascia. Nearby collection of fluid along the right mid anterior abdominal wall, measuring 4.0 x 1.5 cm, may reflect recent soft tissue injury or possibly a postoperative seroma. 4. Left renal atrophy and scarring noted. Nonobstructing 3 mm stone at the interpole region of the right kidney. 5. Scattered coronary artery calcifications noted. Scattered calcification at the mitral and aortic valves. 6. Scattered calcification along the abdominal aorta and its branches. 7. The patient's left lower quadrant colostomy is unremarkable in appearance. 8. Mild degenerative change along the lower lumbar spine, with chronic bilateral pars defects at L5, and mild grade 1 anterolisthesis of L5 on S1. These results were called by telephone at the time of interpretation on 02/01/2015 at 6:40 pm to Dr. Conni Slipper, who verbally acknowledged these results.   Electronically  Signed   By: Garald Balding M.D.   On: 02/01/2015 18:43   Ct Chest Wo Contrast  02/01/2015   CLINICAL DATA:  Acute onset of generalized abdominal pain and cough. Pressure ulcer along the back. Shaking. Initial encounter.  EXAM: CT CHEST, ABDOMEN AND PELVIS WITHOUT CONTRAST  TECHNIQUE: Multidetector CT imaging of the chest, abdomen and pelvis was performed following the standard protocol without IV contrast.  COMPARISON:  PET/CT performed 11/08/2014  FINDINGS: CT CHEST FINDINGS  Minimal bibasilar atelectasis is noted. The lungs are otherwise clear. No focal consolidation, pleural effusion or pneumothorax is seen. No masses are identified.  Scattered coronary artery calcifications are seen. Scattered calcification is  noted at the mitral and aortic valves. The mediastinum is otherwise unremarkable. No mediastinal lymphadenopathy is seen. No pericardial effusion is seen. The great vessels are grossly unremarkable. The thyroid gland is unremarkable in appearance. No axillary lymphadenopathy is appreciated.  No acute osseous abnormalities are identified.  CT ABDOMEN AND PELVIS FINDINGS  The liver and spleen are unremarkable in appearance. The gallbladder is within normal limits. The pancreas and adrenal glands are unremarkable.  There is moderate right-sided hydronephrosis, new from the prior study. Nonspecific perinephric stranding is noted bilaterally, and left renal atrophy and scarring are seen. No obstructing ureteral stone is seen; this may reflect a recently passed stone. A nonobstructing 3 mm stone is noted at the interpole region of the right kidney.  No free fluid is identified. The small bowel is unremarkable in appearance. The stomach is within normal limits. No acute vascular abnormalities are seen. Scattered calcification is noted along the abdominal aorta and its branches.  The appendix is not definitely seen; there is no evidence for appendicitis. The patient is status post resection of the distal  sigmoid colon and rectum, with a left lower quadrant colostomy. The colostomy is unremarkable in appearance.  There is a 5.9 x 3.1 cm collection of fluid and air at the presacral space, abutting the sacrum and coccyx, and compatible with an abscess. This appears somewhat contiguous with the large sacral decubitus ulceration, which measures approximately 4.0 cm in depth and 11.9 cm in length, with a defect in the coccyx at its superior edge. Underlying osteomyelitis is a concern. Surrounding soft tissue inflammation extends within the lower pelvis.  There appears to be a residual anterior soft tissue wound, with minimal air tracking superiorly and laterally along the fascia. A nearby collection of fluid along the right mid anterior abdominal wall, measuring 4.0 x 1.5 cm, may reflect recent injury or possibly a postoperative seroma.  The bladder is moderately distended and grossly unremarkable. Scattered prostate brachytherapy seeds are seen. No inguinal lymphadenopathy is seen.  No acute osseous abnormalities are identified. Multilevel degenerative change is noted along the lower lumbar spine, with chronic bilateral pars defects at L5, and mild grade 1 anterolisthesis of L5 on S1.  IMPRESSION: 1. Status post resection of the distal sigmoid colon and rectum, there is a 5.9 x 3.1 cm collection of fluid and air at the presacral space, abutting the sacrum and coccyx, compatible with an abscess. This appears somewhat contiguous with the large sacral decubitus ulceration, which measures 4.0 cm in depth and 11.9 cm in length, with a defect in the coccyx at its superior edge. Underlying osteomyelitis is a concern, but is not well assessed on CT. Surrounding soft tissue inflammation within the lower pelvis. 2. Moderate right-sided hydronephrosis, new from the prior study. No obstructing ureteral stone. This may reflect a recently passed stone, as there is no additional finding seen to suggest obstruction. Would correlate with  the patient's symptoms. 3. Residual anterior soft tissue wound, with minimal associated air tracking superiorly and laterally along the anterior abdominal wall fascia. Nearby collection of fluid along the right mid anterior abdominal wall, measuring 4.0 x 1.5 cm, may reflect recent soft tissue injury or possibly a postoperative seroma. 4. Left renal atrophy and scarring noted. Nonobstructing 3 mm stone at the interpole region of the right kidney. 5. Scattered coronary artery calcifications noted. Scattered calcification at the mitral and aortic valves. 6. Scattered calcification along the abdominal aorta and its branches. 7. The patient's left lower quadrant colostomy is  unremarkable in appearance. 8. Mild degenerative change along the lower lumbar spine, with chronic bilateral pars defects at L5, and mild grade 1 anterolisthesis of L5 on S1. These results were called by telephone at the time of interpretation on 02/01/2015 at 6:40 pm to Dr. Conni Slipper, who verbally acknowledged these results.   Electronically Signed   By: Garald Balding M.D.   On: 02/01/2015 18:43    EKG:   Orders placed or performed in visit on 02/01/15  . EKG 12-Lead    IMPRESSION AND PLAN:  Principal Problem:   Abscess of male pelvis - with leukocytosis, defer to surgery's recommendations and interventions for the same Active Problems:   Diabetes mellitus, type 2 - hold home and do glycemic medications for now, sliding scale insulin every 6 hours with appropriate fingerstick glucose checks while patient is nothing by mouth, this will need to be changed to before meals at bedtime once the patient is eating a carb modified diet again   Benign essential HTN - blood pressure is on the low side, hold home antihypertensives for now   Arthritis - appropriate pain management for this in conjunction with pain management of his above principal problem  All the records are reviewed and case discussed with ED provider. Management plans  discussed with the patient and/or family.  CODE STATUS:     Code Status Orders        Start     Ordered   02/01/15 1955  Full code   Continuous     02/01/15 1958    Full Code  TOTAL TIME TAKING CARE OF THIS PATIENT: 40 minutes.    Joal Eakle De Leon 02/01/2015, 9:11 PM  [facility]  Office  [phone]  CC: Primary care Physician: Dicky Doe, MD

## 2015-02-01 NOTE — ED Provider Notes (Signed)
[facility] Emergency Department Provider Note  ____________________________________________  Time seen: Approximately 3:27 PM  I have reviewed the triage vital signs and the nursing notes.   HISTORY  Chief Complaint Failure To Thrive and URI    HPI Rudolf Blizard. is a 81 y.o. male patient had surgery for rectal cancer in July and then [facility] patient is apparently been getting weaker and weaker there. Patient also developed bedsore of his sacrum there. Today family went to see him and he was shaking all over like he is having shaking chills and coughing up thick white phlegm. They asked that he be brought here. Patient did not apparently have a fever there. His blood pressure seems to be somewhat lower today than usual. Patient is also complaining of abdominal pain.  Past Medical History  Diagnosis Date  . Hypertension   . Peripheral vascular disease   . Hyperlipidemia   . Diarrhea   . Diabetes mellitus without complication   . BPH (benign prostatic hyperplasia)   . Prostate cancer   . Arthritis   . Knee pain, bilateral   . Rectal cancer   . Chronic kidney disease     incontience  . IBS (irritable bowel syndrome)   . Neuropathy due to secondary diabetes mellitus   . Hx of pulmonary embolus     2011--treated with Coumadin    Patient Active Problem List   Diagnosis Date Noted  . Oligouria   . Peripheral neuropathy 11/08/2014  . Rectal cancer 11/06/2014  . Fecal incontinence due to anorectal disorder 11/06/2014  . Arthritis 10/31/2014  . At risk for injury 10/31/2014  . Chronic pain 10/31/2014  . Benign fibroma of prostate 10/31/2014  . Chronic dermatitis 10/31/2014  . Chronic diarrhea 10/31/2014  . Cough 10/31/2014  . Inflammatory dermatosis 10/31/2014  . Diabetes mellitus, type 2 10/31/2014  . H/O disease 10/31/2014  . Dizziness 10/31/2014  . Fatigue 10/31/2014  . Gastric reflux 10/31/2014  . BP (high blood  pressure) 10/31/2014  . H/O malignant neoplasm of prostate 10/31/2014  . HLD (hyperlipidemia) 10/31/2014  . H/O: HTN (hypertension) 10/31/2014  . Absence of bladder continence 10/31/2014  . Parotiditis 10/31/2014  . Vascular disorder of lower extremity 10/31/2014  . Alveolar aeration decreased 10/31/2014  . Dermatophytosis of groin 10/31/2014  . Gait instability 10/31/2014  . CA of prostate 12/27/2012    Past Surgical History  Procedure Laterality Date  . Prostatectomy    . Joint replacement      left knee  . Colonoscopy N/A 10/23/2014    Procedure: COLONOSCOPY;  Surgeon: Lucilla Lame, MD;  Location: [place];  Service: Gastroenterology;  Laterality: N/A;  . Knee arthroscopy w/ meniscal repair Left 2012  . Eus N/A 11/14/2014    Procedure: LOWER ENDOSCOPIC ULTRASOUND (EUS);  Surgeon: Holly Bodily, MD;  Location: [facility];  Service: Endoscopy;  Laterality: N/A;  . Eye surgery Bilateral     cataract extraction  . Tonsillectomy    . Abdominal perineal bowel resection N/A 12/12/2014    Procedure: ABDOMINAL PERINEAL RESECTION;  Surgeon: [name], MD;  Location: [facility];  Service: General;  Laterality: N/A;  . Cystoscopy with stent placement Bilateral 12/12/2014    Procedure: cystoscopy, bilateral retrogrades, left ureteral stent placement, attempted right stent placement.;  Surgeon: Hollice Espy, MD;  Location: [facility];  Service: Urology;  Laterality: Bilateral;  . Irrigation and debridement abscess N/A 12/23/2014    Procedure: IRRIGATION AND DEBRIDEMENT ABSCESS/ PERINEAL  WOUND DEBRIDMENT;  Surgeon: Marlyce Huge, MD;  Location: [facility];  Service: General;  Laterality: N/A;  . Irrigation and debridement abscess N/A 12/25/2014    Procedure: ZSWFUXNATF AND DEBRIDEMENT ABSCESS;  Surgeon: Marlyce Huge, MD;  Location: [facility];  Service: General;  Laterality: N/A;  . Application of wound vac N/A 12/25/2014    Procedure: APPLICATION OF WOUND VAC;   Surgeon: Marlyce Huge, MD;  Location: [facility];  Service: General;  Laterality: N/A;  . Minor application of wound vac N/A 12/30/2014    Procedure: MINOR APPLICATION OF WOUND VAC;  Surgeon: [name], MD;  Location: [facility];  Service: General;  Laterality: N/A;    Current Outpatient Rx  Name  Route  Sig  Dispense  Refill  . acetaminophen (TYLENOL) 325 MG tablet   Oral   Take 650 mg by mouth every 6 (six) hours as needed.         Marland Kitchen amLODipine (NORVASC) 2.5 MG tablet   Oral   Take 1 tablet (2.5 mg total) by mouth daily.   90 tablet   3   . cholecalciferol (VITAMIN D) 1000 UNITS tablet   Oral   Take 2,000 Units by mouth daily.          . ergocalciferol (VITAMIN D2) 50000 UNITS capsule   Oral   Take 50,000 Units by mouth once a week.         . ferrous sulfate 325 (65 FE) MG tablet   Oral   Take 325 mg by mouth 3 (three) times daily with meals.         . gabapentin (NEURONTIN) 100 MG capsule   Oral   Take 1 capsule (100 mg total) by mouth at bedtime.   30 capsule   6   . glimepiride (AMARYL) 4 MG tablet   Oral   Take 4 mg by mouth daily with breakfast.         . HYDROcodone-acetaminophen (NORCO/VICODIN) 5-325 MG per tablet   Oral   Take 1 tablet by mouth every 6 (six) hours as needed for moderate pain.   30 tablet   0   . losartan (COZAAR) 100 MG tablet   Oral   Take 1 tablet (100 mg total) by mouth daily.   90 tablet   3   . sennosides-docusate sodium (SENOKOT-S) 8.6-50 MG tablet   Oral   Take 1 tablet by mouth daily.           Allergies Review of patient's allergies indicates no known allergies.  Family History  Problem Relation Age of Onset  . Heart disease Mother   . Heart disease Father   . Cancer Father 2    Colon  . Multiple sclerosis Son     Social History Social History  Substance Use Topics  . Smoking status: Never Smoker   . Smokeless tobacco: Never Used  . Alcohol Use: No    Review of Systems Constitutional: No  febut he is having chillsyes: No visual changes. ENT: No sore throat. Cardiovascular: Denies chest pain. Respiratory: Denies shortness of breath. Gastrointestinal  No nausea, no vomiting.   Patient has an ostomyGenitourinary: Negative for dysuria. Musculoskeletal: Negative for back pain. Skin: Negative for rash patient does have the decubitus Neurological: Negative for headaches, focal weakness or numbness.  10-point ROS otherwise negative.  ____________________________________________   PHYSICAL EXAM:  VITAL SIGNS: ED Triage Vitals  Enc Vitals Group     BP 02/01/15 1502 108/56 mmHg  Pulse Rate 02/01/15 1502 87     Resp 02/01/15 1502 12     Temp 02/01/15 1502 97.9 F (36.6 C)     Temp Source 02/01/15 1502 Oral     SpO2 02/01/15 1502 100 %     Weight 02/01/15 1505 215 lb (97.523 kg)     Height 02/01/15 1505 6\' 2"  (1.88 m)     Head Cir --      [facility] --      Pain Score 02/01/15 1504 4     Pain Loc --      Pain Edu? --      Excl. in [place]? --     Constitutional: Alert and oriented. Well appearing and in no acute distress. Eyes: Conjunctivae are normal. PERRL. EOMI. Head: Atraumatic. Nose: No congestion/rhinnorhea. Mouth/Throat: Mucous membranes are moist.  Oropharynx non-erythematous. Neck: No stridor.   Cardiovascular: Normal rate, regular rhythm. Grossly normal heart sounds.  Good peripheral circulation. Respiratory: Normal respiratory effort.  No retractions. Lungs CTAB. Gastrointestinal: SoMildly diffusely tenderNo distention. No abdominal bruits. No CVA teostomy is present the stool in the ostomy looked dark and Hemoccult-positive and he was Hemoccult-positive Patient has a deep sacral decubitus. There is a lot of redness surrounding it. The wound in the rectal area looks clean it is not contiguous with the decubitus there is a very small dime-sized superficial stage II decubitus just to the right of the dressing over the main decubitus Musculoskeletal: No lower  extremity tenderness nor edema.  No joint effusions. Neurologic:  Normal speech and language. No gross focal neurologic deficits are appreciated.  Skin:  Skin is warm, dry and inCeftin area of decubitus Psychiatric: Mood and affect are normal. Speech and behavior are normal.  ____________________________________________   LABS (all labs ordered are listed, but only abnormal results are displayed)  Labs Reviewed  COMPREHENSIVE METABOLIC PANEL - Abnormal; Notable for the following:    Sodium 131 (*)    Potassium 5.4 (*)    CO2 21 (*)    Glucose, Bld 132 (*)    BUN 65 (*)    Creatinine, Ser 2.02 (*)    Calcium 8.5 (*)    Total Protein 6.1 (*)    Albumin 2.0 (*)    AST 95 (*)    ALT 88 (*)    GFR calc non Af Amer 29 (*)    GFR calc Af Amer 33 (*)    All other components within normal limits  LACTIC ACID, PLASMA - Abnormal; Notable for the following:    Lactic Acid, Venous 2.3 (*)    All other components within normal limits  CBC WITH DIFFERENTIAL/PLATELET - Abnormal; Notable for the following:    WBC 23.5 (*)    RBC 3.17 (*)    Hemoglobin 8.5 (*)    HCT 26.8 (*)    MCHC 31.8 (*)    RDW 16.1 (*)    Neutro Abs 21.6 (*)    Lymphs Abs 0.7 (*)    All other components within normal limits  URINALYSIS COMPLETEWITH MICROSCOPIC ([facility] ONLY) - Abnormal; Notable for the following:    Color, Urine YELLOW (*)    APPearance CLEAR (*)    Bacteria, UA RARE (*)    All other components within normal limits  CULTURE, BLOOD (ROUTINE X 2)  CULTURE, BLOOD (ROUTINE X 2)  URINE CULTURE  LIPASE, BLOOD  TROPONIN I  LACTIC ACID, PLASMA   ____________________________________________  [facility] read and interpreted by me shows normal sinus rhythm  a rate of 88 left axis basically normal EKG ____________________________________________  RADIOLOGY  Radiologist calls me to tell me the patient has a 6 cm presacral abscess patient also has hydronephrosis is fairly marked evidence ulcer which is very  close to the coccyx ____________________________________________   PROCEDURES  Antibiotics ordered IV fluid given and the rate increased second liter started  ____________________________________________   INITIAL IMPRESSION / ASSESSMENT AND PLAN / ED COURSE  Pertinent labs & imaging results that were available during my care of the patient were reviewed by me and considered in my medical decision making (see chart for details). Patient's diagnosis would include sepsis only there is no sepsis unspecified available in the Epic system that I can find. Critical care time including treating patient examining his decubitus and discussing him with his daughter and mother 30 minutes  ____________________________________________   FINAL CLINICAL IMPRESSION(S) / ED DIAGNOSES  Final diagnoses:  Abscess  Decubital ulcer  Cellulitis of trunk, unspecified site  Other hydronephrosis      Nena Polio, MD 02/01/15 1905

## 2015-02-02 LAB — GLUCOSE, CAPILLARY
GLUCOSE-CAPILLARY: 39 mg/dL — AB (ref 65–99)
GLUCOSE-CAPILLARY: 52 mg/dL — AB (ref 65–99)
GLUCOSE-CAPILLARY: 71 mg/dL (ref 65–99)
Glucose-Capillary: 134 mg/dL — ABNORMAL HIGH (ref 65–99)
Glucose-Capillary: 207 mg/dL — ABNORMAL HIGH (ref 65–99)
Glucose-Capillary: 36 mg/dL — CL (ref 65–99)
Glucose-Capillary: 37 mg/dL — CL (ref 65–99)
Glucose-Capillary: 58 mg/dL — ABNORMAL LOW (ref 65–99)
Glucose-Capillary: 83 mg/dL (ref 65–99)
Glucose-Capillary: 93 mg/dL (ref 65–99)

## 2015-02-02 LAB — BASIC METABOLIC PANEL
ANION GAP: 6 (ref 5–15)
BUN: 53 mg/dL — ABNORMAL HIGH (ref 6–20)
CALCIUM: 7.8 mg/dL — AB (ref 8.9–10.3)
CO2: 20 mmol/L — AB (ref 22–32)
CREATININE: 1.8 mg/dL — AB (ref 0.61–1.24)
Chloride: 107 mmol/L (ref 101–111)
GFR, EST AFRICAN AMERICAN: 38 mL/min — AB (ref >60)
GFR, EST NON AFRICAN AMERICAN: 33 mL/min — AB (ref >60)
Glucose, Bld: 81 mg/dL (ref 65–99)
Potassium: 4.6 mmol/L (ref 3.5–5.1)
SODIUM: 133 mmol/L — AB (ref 135–145)

## 2015-02-02 LAB — HEMOGLOBIN A1C: HEMOGLOBIN A1C: 7 % — AB (ref 4.0–6.0)

## 2015-02-02 LAB — CBC
HCT: 24.7 % — ABNORMAL LOW (ref 40.0–52.0)
HEMOGLOBIN: 7.7 g/dL — AB (ref 13.0–18.0)
MCH: 26.4 pg (ref 26.0–34.0)
MCHC: 31.1 g/dL — ABNORMAL LOW (ref 32.0–36.0)
MCV: 84.9 fL (ref 80.0–100.0)
PLATELETS: 366 10*3/uL (ref 150–440)
RBC: 2.91 MIL/uL — AB (ref 4.40–5.90)
RDW: 15.6 % — ABNORMAL HIGH (ref 11.5–14.5)
WBC: 17.3 10*3/uL — AB (ref 3.8–10.6)

## 2015-02-02 MED ORDER — DEXTROSE-NACL 5-0.45 % IV SOLN
INTRAVENOUS | Status: DC
  Administered 2015-02-02 – 2015-02-06 (×9): via INTRAVENOUS
  Filled 2015-02-02: qty 1000

## 2015-02-02 NOTE — Progress Notes (Signed)
Dr. Rexene Edison notified of low bg. Giving food and juice and rechecking sugars - per hypoglycemic protocol. Will continue to monitor.

## 2015-02-02 NOTE — Progress Notes (Signed)
Wound with necrotic tissue.  Have spoken with IR who is amenable to perc drainage but not sure whether it would be better to attempt operative drainage which would allow penrose placement and allow more sufficient drainage than a smaller bore IR catheter.  Will discuss with Dr. Marina Gravel as he has seen wound and patient more regularly and will be surgeon following him next week.

## 2015-02-02 NOTE — Progress Notes (Signed)
Clinical Social Worker (CSW) received consult that patient is from a facility. CSW attempted to meet with patient however the MD was at bedside doing a physical examination. FL2 complete and on chart. CSW will attempt to see patient at a later time.   Blima Rich, Post [phone]

## 2015-02-02 NOTE — Progress Notes (Signed)
[facility] [place] NAME: Darryl Hooper    MR#:  [id number]  DATE OF BIRTH:  Aug 03, 1929  SUBJECTIVE:  CHIEF COMPLAINT:   Chief Complaint  Patient presents with  . Failure To Thrive  . URI   lethargic, family member at bedside  REVIEW OF SYSTEMS:  Review of Systems  Unable to perform ROS: acuity of condition   DRUG ALLERGIES:  No Known Allergies VITALS:  Blood pressure 131/69, pulse 67, temperature 98 F (36.7 C), temperature source Oral, resp. rate 17, height 6\' 2"  (1.88 m), weight 97.523 kg (215 lb), SpO2 99 %. PHYSICAL EXAMINATION:  Physical Exam  Constitutional: He is oriented to person, place, and time. He appears lethargic, malnourished and dehydrated. He appears unhealthy. He appears cachectic. He appears toxic. He has a sickly appearance.  HENT:  Head: Normocephalic and atraumatic.  Eyes: Conjunctivae and EOM are normal. Pupils are equal, round, and reactive to light.  Neck: Normal range of motion. Neck supple. No tracheal deviation present. No thyromegaly present.  Cardiovascular: Normal rate, regular rhythm and normal heart sounds.   Pulmonary/Chest: Effort normal and breath sounds normal. No respiratory distress. He has no wheezes. He exhibits no tenderness.  Abdominal: Soft. Bowel sounds are normal. He exhibits no distension. There is no tenderness.  Musculoskeletal: Normal range of motion.  Neurological: He is oriented to person, place, and time. He appears lethargic. No cranial nerve deficit.  Skin: Skin is warm and dry. No rash noted.  Sacral decubitus , grade 3 present on admission    Psychiatric: Mood and affect normal.   LABORATORY PANEL:   CBC  Recent Labs Lab 02/02/15 0714  WBC 17.3*  HGB 7.7*  HCT 24.7*  PLT 366   ------------------------------------------------------------------------------------------------------------------ Chemistries   Recent Labs Lab 02/01/15 1536 02/02/15 0714  NA  131* 133*  K 5.4* 4.6  CL 103 107  CO2 21* 20*  GLUCOSE 132* 81  BUN 65* 53*  CREATININE 2.02* 1.80*  CALCIUM 8.5* 7.8*  AST 95*  --   ALT 88*  --   ALKPHOS 78  --   BILITOT 0.4  --    RADIOLOGY:  Ct Abdomen Pelvis Wo Contrast  02/01/2015   CLINICAL DATA:  Acute onset of generalized abdominal pain and cough. Pressure ulcer along the back. Shaking. Initial encounter.  EXAM: CT CHEST, ABDOMEN AND PELVIS WITHOUT CONTRAST  TECHNIQUE: Multidetector CT imaging of the chest, abdomen and pelvis was performed following the standard protocol without IV contrast.  COMPARISON:  PET/CT performed 11/08/2014  FINDINGS: CT CHEST FINDINGS  Minimal bibasilar atelectasis is noted. The lungs are otherwise clear. No focal consolidation, pleural effusion or pneumothorax is seen. No masses are identified.  Scattered coronary artery calcifications are seen. Scattered calcification is noted at the mitral and aortic valves. The mediastinum is otherwise unremarkable. No mediastinal lymphadenopathy is seen. No pericardial effusion is seen. The great vessels are grossly unremarkable. The thyroid gland is unremarkable in appearance. No axillary lymphadenopathy is appreciated.  No acute osseous abnormalities are identified.  CT ABDOMEN AND PELVIS FINDINGS  The liver and spleen are unremarkable in appearance. The gallbladder is within normal limits. The pancreas and adrenal glands are unremarkable.  There is moderate right-sided hydronephrosis, new from the prior study. Nonspecific perinephric stranding is noted bilaterally, and left renal atrophy and scarring are seen. No obstructing ureteral stone is seen; this may reflect a recently passed stone. A nonobstructing 3 mm stone is noted at the  interpole region of the right kidney.  No free fluid is identified. The small bowel is unremarkable in appearance. The stomach is within normal limits. No acute vascular abnormalities are seen. Scattered calcification is noted along the  abdominal aorta and its branches.  The appendix is not definitely seen; there is no evidence for appendicitis. The patient is status post resection of the distal sigmoid colon and rectum, with a left lower quadrant colostomy. The colostomy is unremarkable in appearance.  There is a 5.9 x 3.1 cm collection of fluid and air at the presacral space, abutting the sacrum and coccyx, and compatible with an abscess. This appears somewhat contiguous with the large sacral decubitus ulceration, which measures approximately 4.0 cm in depth and 11.9 cm in length, with a defect in the coccyx at its superior edge. Underlying osteomyelitis is a concern. Surrounding soft tissue inflammation extends within the lower pelvis.  There appears to be a residual anterior soft tissue wound, with minimal air tracking superiorly and laterally along the fascia. A nearby collection of fluid along the right mid anterior abdominal wall, measuring 4.0 x 1.5 cm, may reflect recent injury or possibly a postoperative seroma.  The bladder is moderately distended and grossly unremarkable. Scattered prostate brachytherapy seeds are seen. No inguinal lymphadenopathy is seen.  No acute osseous abnormalities are identified. Multilevel degenerative change is noted along the lower lumbar spine, with chronic bilateral pars defects at L5, and mild grade 1 anterolisthesis of L5 on S1.  IMPRESSION: 1. Status post resection of the distal sigmoid colon and rectum, there is a 5.9 x 3.1 cm collection of fluid and air at the presacral space, abutting the sacrum and coccyx, compatible with an abscess. This appears somewhat contiguous with the large sacral decubitus ulceration, which measures 4.0 cm in depth and 11.9 cm in length, with a defect in the coccyx at its superior edge. Underlying osteomyelitis is a concern, but is not well assessed on CT. Surrounding soft tissue inflammation within the lower pelvis. 2. Moderate right-sided hydronephrosis, new from the prior  study. No obstructing ureteral stone. This may reflect a recently passed stone, as there is no additional finding seen to suggest obstruction. Would correlate with the patient's symptoms. 3. Residual anterior soft tissue wound, with minimal associated air tracking superiorly and laterally along the anterior abdominal wall fascia. Nearby collection of fluid along the right mid anterior abdominal wall, measuring 4.0 x 1.5 cm, may reflect recent soft tissue injury or possibly a postoperative seroma. 4. Left renal atrophy and scarring noted. Nonobstructing 3 mm stone at the interpole region of the right kidney. 5. Scattered coronary artery calcifications noted. Scattered calcification at the mitral and aortic valves. 6. Scattered calcification along the abdominal aorta and its branches. 7. The patient's left lower quadrant colostomy is unremarkable in appearance. 8. Mild degenerative change along the lower lumbar spine, with chronic bilateral pars defects at L5, and mild grade 1 anterolisthesis of L5 on S1. These results were called by telephone at the time of interpretation on 02/01/2015 at 6:40 pm to Dr. Conni Slipper, who verbally acknowledged these results.   Electronically Signed   By: Garald Balding M.D.   On: 02/01/2015 18:43   Ct Chest Wo Contrast  02/01/2015   CLINICAL DATA:  Acute onset of generalized abdominal pain and cough. Pressure ulcer along the back. Shaking. Initial encounter.  EXAM: CT CHEST, ABDOMEN AND PELVIS WITHOUT CONTRAST  TECHNIQUE: Multidetector CT imaging of the chest, abdomen and pelvis was performed following the  standard protocol without IV contrast.  COMPARISON:  PET/CT performed 11/08/2014  FINDINGS: CT CHEST FINDINGS  Minimal bibasilar atelectasis is noted. The lungs are otherwise clear. No focal consolidation, pleural effusion or pneumothorax is seen. No masses are identified.  Scattered coronary artery calcifications are seen. Scattered calcification is noted at the mitral and aortic  valves. The mediastinum is otherwise unremarkable. No mediastinal lymphadenopathy is seen. No pericardial effusion is seen. The great vessels are grossly unremarkable. The thyroid gland is unremarkable in appearance. No axillary lymphadenopathy is appreciated.  No acute osseous abnormalities are identified.  CT ABDOMEN AND PELVIS FINDINGS  The liver and spleen are unremarkable in appearance. The gallbladder is within normal limits. The pancreas and adrenal glands are unremarkable.  There is moderate right-sided hydronephrosis, new from the prior study. Nonspecific perinephric stranding is noted bilaterally, and left renal atrophy and scarring are seen. No obstructing ureteral stone is seen; this may reflect a recently passed stone. A nonobstructing 3 mm stone is noted at the interpole region of the right kidney.  No free fluid is identified. The small bowel is unremarkable in appearance. The stomach is within normal limits. No acute vascular abnormalities are seen. Scattered calcification is noted along the abdominal aorta and its branches.  The appendix is not definitely seen; there is no evidence for appendicitis. The patient is status post resection of the distal sigmoid colon and rectum, with a left lower quadrant colostomy. The colostomy is unremarkable in appearance.  There is a 5.9 x 3.1 cm collection of fluid and air at the presacral space, abutting the sacrum and coccyx, and compatible with an abscess. This appears somewhat contiguous with the large sacral decubitus ulceration, which measures approximately 4.0 cm in depth and 11.9 cm in length, with a defect in the coccyx at its superior edge. Underlying osteomyelitis is a concern. Surrounding soft tissue inflammation extends within the lower pelvis.  There appears to be a residual anterior soft tissue wound, with minimal air tracking superiorly and laterally along the fascia. A nearby collection of fluid along the right mid anterior abdominal wall,  measuring 4.0 x 1.5 cm, may reflect recent injury or possibly a postoperative seroma.  The bladder is moderately distended and grossly unremarkable. Scattered prostate brachytherapy seeds are seen. No inguinal lymphadenopathy is seen.  No acute osseous abnormalities are identified. Multilevel degenerative change is noted along the lower lumbar spine, with chronic bilateral pars defects at L5, and mild grade 1 anterolisthesis of L5 on S1.  IMPRESSION: 1. Status post resection of the distal sigmoid colon and rectum, there is a 5.9 x 3.1 cm collection of fluid and air at the presacral space, abutting the sacrum and coccyx, compatible with an abscess. This appears somewhat contiguous with the large sacral decubitus ulceration, which measures 4.0 cm in depth and 11.9 cm in length, with a defect in the coccyx at its superior edge. Underlying osteomyelitis is a concern, but is not well assessed on CT. Surrounding soft tissue inflammation within the lower pelvis. 2. Moderate right-sided hydronephrosis, new from the prior study. No obstructing ureteral stone. This may reflect a recently passed stone, as there is no additional finding seen to suggest obstruction. Would correlate with the patient's symptoms. 3. Residual anterior soft tissue wound, with minimal associated air tracking superiorly and laterally along the anterior abdominal wall fascia. Nearby collection of fluid along the right mid anterior abdominal wall, measuring 4.0 x 1.5 cm, may reflect recent soft tissue injury or possibly a postoperative seroma. 4.  Left renal atrophy and scarring noted. Nonobstructing 3 mm stone at the interpole region of the right kidney. 5. Scattered coronary artery calcifications noted. Scattered calcification at the mitral and aortic valves. 6. Scattered calcification along the abdominal aorta and its branches. 7. The patient's left lower quadrant colostomy is unremarkable in appearance. 8. Mild degenerative change along the lower  lumbar spine, with chronic bilateral pars defects at L5, and mild grade 1 anterolisthesis of L5 on S1. These results were called by telephone at the time of interpretation on 02/01/2015 at 6:40 pm to Dr. Conni Slipper, who verbally acknowledged these results.   Electronically Signed   By: Garald Balding M.D.   On: 02/01/2015 18:43   ASSESSMENT AND PLAN:   * Hypoglycemia with a history of Diabetes mellitus, type 2 - follow hypoglycemia protocols while in the hospital. hold home diabetes medications for now, sliding scale insulin every 6 hours with appropriate fingerstick glucose checks while patient is nothing by mouth, this will need to be changed to before meals at bedtime once the patient is eating a carb modified diet again.  Will consult diabetic nurse coordinator  *Hypotension with a history of Benign essential HTN - blood pressure is on the low side, hold home antihypertensives for now  * CKD stage III: close to baseline.  We will monitor  * Arthritis - appropriate pain management for this in conjunction with pain management of his above principal problem  * Abscess of male pelvis - with leukocytosis, defer to surgery's recommendations and interventions for the same.    All the records are reviewed and case discussed with Care Management/Social Worker. Management plans discussed with the patient, family and they are in agreement.  CODE STATUS: Full code  TOTAL TIME TAKING CARE OF THIS PATIENT: 15 minutes.   More than 50% of the time was spent in counseling/coordination of care: Augustina Mood, Abaigeal Moomaw M.D on 02/02/2015 at 11:45 AM  Between 7am to 6pm - Pager - [phone]  After 6pm go to [email or web address] - password [email or web address] [facility]  Office  [phone]  CC:  Primary care physician; Dicky Doe, MD

## 2015-02-03 ENCOUNTER — Inpatient Hospital Stay: Payer: Medicare Other | Admitting: Anesthesiology

## 2015-02-03 ENCOUNTER — Encounter
Admission: EM | Disposition: A | Discharge status: Skilled Nursing Facility | Payer: Self-pay | Source: Home / Self Care | Attending: Surgery

## 2015-02-03 ENCOUNTER — Encounter: Payer: Self-pay | Admitting: Anesthesiology

## 2015-02-03 DIAGNOSIS — L8990 Pressure ulcer of unspecified site, unspecified stage: Secondary | ICD-10-CM | POA: Insufficient documentation

## 2015-02-03 HISTORY — PX: IRRIGATION AND DEBRIDEMENT BUTTOCKS: SHX6601

## 2015-02-03 LAB — CBC
HEMATOCRIT: 20.2 % — AB (ref 40.0–52.0)
Hemoglobin: 6.4 g/dL — ABNORMAL LOW (ref 13.0–18.0)
MCH: 26.9 pg (ref 26.0–34.0)
MCHC: 31.8 g/dL — ABNORMAL LOW (ref 32.0–36.0)
MCV: 84.6 fL (ref 80.0–100.0)
PLATELETS: 307 10*3/uL (ref 150–440)
RBC: 2.38 MIL/uL — AB (ref 4.40–5.90)
RDW: 15.7 % — ABNORMAL HIGH (ref 11.5–14.5)
WBC: 12.7 10*3/uL — ABNORMAL HIGH (ref 3.8–10.6)

## 2015-02-03 LAB — BASIC METABOLIC PANEL
ANION GAP: 4 — AB (ref 5–15)
BUN: 44 mg/dL — AB (ref 6–20)
CO2: 22 mmol/L (ref 22–32)
Calcium: 7.2 mg/dL — ABNORMAL LOW (ref 8.9–10.3)
Chloride: 105 mmol/L (ref 101–111)
Creatinine, Ser: 1.77 mg/dL — ABNORMAL HIGH (ref 0.61–1.24)
GFR calc Af Amer: 39 mL/min — ABNORMAL LOW (ref >60)
GFR, EST NON AFRICAN AMERICAN: 34 mL/min — AB (ref >60)
GLUCOSE: 61 mg/dL — AB (ref 65–99)
POTASSIUM: 4.6 mmol/L (ref 3.5–5.1)
Sodium: 131 mmol/L — ABNORMAL LOW (ref 135–145)

## 2015-02-03 LAB — GLUCOSE, CAPILLARY
GLUCOSE-CAPILLARY: 74 mg/dL (ref 65–99)
Glucose-Capillary: 119 mg/dL — ABNORMAL HIGH (ref 65–99)
Glucose-Capillary: 168 mg/dL — ABNORMAL HIGH (ref 65–99)

## 2015-02-03 LAB — HEMOGLOBIN AND HEMATOCRIT, BLOOD
HCT: 23.6 % — ABNORMAL LOW (ref 40.0–52.0)
Hemoglobin: 7.7 g/dL — ABNORMAL LOW (ref 13.0–18.0)

## 2015-02-03 LAB — URINE CULTURE: SPECIAL REQUESTS: NORMAL

## 2015-02-03 LAB — PREPARE RBC (CROSSMATCH)

## 2015-02-03 LAB — SURGICAL PCR SCREEN
MRSA, PCR: POSITIVE — AB
Staphylococcus aureus: POSITIVE — AB

## 2015-02-03 SURGERY — IRRIGATION AND DEBRIDEMENT BUTTOCKS
Anesthesia: General | Wound class: Contaminated

## 2015-02-03 MED ORDER — FENTANYL CITRATE (PF) 100 MCG/2ML IJ SOLN
INTRAMUSCULAR | Status: DC | PRN
  Administered 2015-02-03: 50 ug via INTRAVENOUS

## 2015-02-03 MED ORDER — SUCCINYLCHOLINE CHLORIDE 20 MG/ML IJ SOLN
INTRAMUSCULAR | Status: DC | PRN
Start: 2015-02-03 — End: 2015-02-03
  Administered 2015-02-03: 100 mg via INTRAVENOUS

## 2015-02-03 MED ORDER — PROPOFOL 10 MG/ML IV BOLUS
INTRAVENOUS | Status: DC | PRN
  Administered 2015-02-03: 70 mg via INTRAVENOUS

## 2015-02-03 MED ORDER — SODIUM CHLORIDE 0.9 % IV SOLN
INTRAVENOUS | Status: DC | PRN
  Administered 2015-02-03: 10:00:00 via INTRAVENOUS

## 2015-02-03 MED ORDER — MORPHINE SULFATE (PF) 2 MG/ML IV SOLN
2.0000 mg | INTRAVENOUS | Status: DC | PRN
  Administered 2015-02-04: 2 mg via INTRAVENOUS
  Filled 2015-02-03: qty 1

## 2015-02-03 MED ORDER — NEOSTIGMINE METHYLSULFATE 10 MG/10ML IV SOLN
INTRAVENOUS | Status: DC | PRN
  Administered 2015-02-03: 2 mg via INTRAVENOUS

## 2015-02-03 MED ORDER — ONDANSETRON HCL 4 MG/2ML IJ SOLN: 4.0000 mg | Freq: Once | INTRAMUSCULAR | Status: DC | PRN

## 2015-02-03 MED ORDER — FAMOTIDINE 20 MG PO TABS
20.0000 mg | ORAL_TABLET | Freq: Every day | ORAL | Status: DC
  Administered 2015-02-03 – 2015-02-07 (×5): 20 mg via ORAL
  Filled 2015-02-03 (×5): qty 1

## 2015-02-03 MED ORDER — PHENYLEPHRINE HCL 10 MG/ML IJ SOLN
INTRAMUSCULAR | Status: DC | PRN
  Administered 2015-02-03 (×3): 100 ug via INTRAVENOUS

## 2015-02-03 MED ORDER — ROCURONIUM BROMIDE 100 MG/10ML IV SOLN
INTRAVENOUS | Status: DC | PRN
  Administered 2015-02-03: 20 mg via INTRAVENOUS

## 2015-02-03 MED ORDER — LIDOCAINE HCL (CARDIAC) 20 MG/ML IV SOLN
INTRAVENOUS | Status: DC | PRN
Start: 2015-02-03 — End: 2015-02-03
  Administered 2015-02-03: 100 mg via INTRAVENOUS

## 2015-02-03 MED ORDER — BUPIVACAINE-EPINEPHRINE (PF) 0.5% -1:200000 IJ SOLN
INTRAMUSCULAR | Status: AC
  Filled 2015-02-03: qty 30

## 2015-02-03 MED ORDER — SODIUM CHLORIDE 0.9 % IV SOLN
Freq: Once | INTRAVENOUS | Status: AC
  Administered 2015-02-03: 16:00:00 via INTRAVENOUS

## 2015-02-03 MED ORDER — EPHEDRINE SULFATE 50 MG/ML IJ SOLN
INTRAMUSCULAR | Status: DC | PRN
Start: 2015-02-03 — End: 2015-02-03
  Administered 2015-02-03: 5 mg via INTRAVENOUS
  Administered 2015-02-03: 10 mg via INTRAVENOUS

## 2015-02-03 MED ORDER — BACITRACIN ZINC 500 UNIT/GM EX OINT
TOPICAL_OINTMENT | CUTANEOUS | Status: AC
  Filled 2015-02-03: qty 28.35

## 2015-02-03 MED ORDER — ONDANSETRON HCL 4 MG/2ML IJ SOLN
INTRAMUSCULAR | Status: DC | PRN
  Administered 2015-02-03: 4 mg via INTRAVENOUS

## 2015-02-03 MED ORDER — GLYCOPYRROLATE 0.2 MG/ML IJ SOLN
INTRAMUSCULAR | Status: DC | PRN
  Administered 2015-02-03: 0.4 mg via INTRAVENOUS

## 2015-02-03 MED ORDER — FENTANYL CITRATE (PF) 100 MCG/2ML IJ SOLN: 25.0000 ug | INTRAMUSCULAR | Status: DC | PRN

## 2015-02-03 MED ORDER — BACITRACIN-NEOMYCIN-POLYMYXIN 400-5-5000 EX OINT
TOPICAL_OINTMENT | CUTANEOUS | Status: AC
  Filled 2015-02-03: qty 1

## 2015-02-03 SURGICAL SUPPLY — 32 items
ADHESIVE MASTISOL STRL (MISCELLANEOUS) ×6 IMPLANT
BLADE CLIPPER SURG (BLADE) ×3 IMPLANT
BLADE SURG 15 STRL LF DISP TIS (BLADE) ×1 IMPLANT
BLADE SURG 15 STRL SS (BLADE) ×2
CANISTER SUCT 1200ML W/VALVE (MISCELLANEOUS) ×3 IMPLANT
CHLORAPREP W/TINT 26ML (MISCELLANEOUS) ×3 IMPLANT
DRAIN PENROSE 5/8X18 LTX STRL (WOUND CARE) ×3 IMPLANT
DRAPE LAPAROTOMY 100X77 ABD (DRAPES) ×3 IMPLANT
DRAPE UTILITY 15X26 TOWEL STRL (DRAPES) ×6 IMPLANT
DRAPE WOUND VAC 10X15X1CM (MISCELLANEOUS) ×3 IMPLANT
DRSG VAC ATS MED SENSATRAC (GAUZE/BANDAGES/DRESSINGS) ×3 IMPLANT
GLOVE BIO SURGEON STRL SZ7.5 (GLOVE) ×3 IMPLANT
GOWN STRL REUS W/ TWL LRG LVL3 (GOWN DISPOSABLE) ×1 IMPLANT
GOWN STRL REUS W/ TWL XL LVL3 (GOWN DISPOSABLE) ×1 IMPLANT
GOWN STRL REUS W/TWL LRG LVL3 (GOWN DISPOSABLE) ×2
GOWN STRL REUS W/TWL XL LVL3 (GOWN DISPOSABLE) ×2
KIT RM TURNOVER STRD PROC AR (KITS) ×3 IMPLANT
LABEL OR SOLS (LABEL) ×3 IMPLANT
NEEDLE HYPO 25X1 1.5 SAFETY (NEEDLE) ×3 IMPLANT
NS IRRIG 500ML POUR BTL (IV SOLUTION) ×3 IMPLANT
PACK BASIN MINOR ARMC (MISCELLANEOUS) ×3 IMPLANT
PAD GROUND ADULT SPLIT (MISCELLANEOUS) ×3 IMPLANT
SPONGE LAP 18X18 5 PK (GAUZE/BANDAGES/DRESSINGS) ×6 IMPLANT
SUT CHROMIC 2 0 CT 1 (SUTURE) ×3 IMPLANT
SUT CHROMIC 3 0 SH 27 (SUTURE) ×3 IMPLANT
SUT ETHILON 3-0 FS-10 30 BLK (SUTURE) ×3
SUTURE EHLN 3-0 FS-10 30 BLK (SUTURE) ×1 IMPLANT
SWABSTK COMLB BENZOIN TINCTURE (MISCELLANEOUS) ×6 IMPLANT
SYR BULB IRRIG 60ML STRL (SYRINGE) ×3 IMPLANT
SYRINGE 10CC LL (SYRINGE) ×3 IMPLANT
TAPE ADH 3 LX (MISCELLANEOUS) ×3 IMPLANT
WND VAC CANISTER 500ML (MISCELLANEOUS) ×3 IMPLANT

## 2015-02-03 NOTE — Consult Note (Signed)
Palliative Medicine Inpatient Consult Note   Name: Darryl Hooper. Date: 02/03/2015 MRN: [id number]  DOB: 1929-11-13  Referring Physician: [name], MD  Palliative Care consult requested for this 81 y.o. male for goals of medical therapy in patient with sepsis due to a pre-sacral abscess.  He had resection of his colon last month for stage II-a rectal cancer, but much fibrosis and scarring was present at the time from his previous treatments for prostate cancer.  He was a skilled facility but has not done well.  He has had operative debridement of the ulcer and drainage of the pre-sacral ulcer by Dr. Marina Gravel and a Jim Desanctis has been placed. He has numerous other problems as detailed in the IMPRESSION list at the bottom of this note.  Note also the PET scan results listed below.     TODAY'S CONVERSATIONS, EVENTS, AND PLANS:  1.  The patient is very weak, but still able to make his own healthcare decisions.  I saw him while he was alone, and felt it appropriate to talk to him alone, since he was alert and able to converse, albeit briefly.   2.   He and I talked about code status and I also later mentioned that he might want to consider Hospice care.  He seemed reticent to think about these matters, but understood.  He will consider these issues and I mentioned that with his permission, I would also like to talk with his family.  I am not convinced he truly understands the issue of code status and it needs to be brought up again.  For now, he remains Full Code.  3.   I am told by nursing staff that the patient's wife is a bit anxious and also very reactive anytime anything is mentioned about her husband's condition or health.  The patient also has adult children involved in his life and I will want to talk with all family. But, the patient is his own decision maker still.  That could change if he worsens, and then it would fall to his wife.  There are no documents that I am made aware of that would give  HCPOA to adult children. We might want to encourage that this be done, but I'd have to meet with family first to confirm this would be a good thing to recommend to them.   4.   If ongoing procedures / debridements are to be done, patient might not be a Hospice candidate just yet.  He could go to a (different) skilled facility (under remaining Skilled Rehab days) WITH a Palliative Care consult and he could transition to being under Hospice care at some point.  Based on today's conversation, I think this will be the likely choice of the patient.  He is not quite ready to quit fighting for his continued survival, and he does not feel that he is suffering terribly since he believes this cancer to be possibly curable.  He understands he is too weak for chemo and not a radiation candidate due to prior radiation.  He knows he has a positive finding in his oral cavity as seen on PET scan and this could be a metastatic lesion OR a new primary cancer. Despite all this, his main complaint is the diabetic neuropathy pain in his feet --something he has had for many years.  He said he 'would like to go on fighting this' if he can.  We will talk again.     REVIEW OF SYSTEMS:  Patient is not able to provide ROS in detail -due to illness and weakness.  He was able to tell me the main thing bothering him and that is the burning in his feet which has gone on for years.     SPIRITUAL SUPPORT SYSTEM: He has a wife and children involved in his care.    SOCIAL HISTORY:  reports that he has never smoked. He has never used smokeless tobacco. He reports that he does not drink alcohol or use illicit drugs.  He was at [facility] but family is wishing to seek placement elsewhere.  Has Managed [employer].   He is married and has adult children involved in his life and care.  He indicates that if he cannot make his own decisions, his wife would be the one to do so.   LEGAL DOCUMENTS:  None  CODE STATUS: Full code at  this time  PAST MEDICAL HISTORY: Past Medical History  Diagnosis Date  . Hypertension   . Peripheral vascular disease   . Hyperlipidemia   . Diarrhea   . Diabetes mellitus without complication   . BPH (benign prostatic hyperplasia)   . Prostate cancer   . Arthritis   . Knee pain, bilateral   . Rectal cancer   . Chronic kidney disease     incontience  . IBS (irritable bowel syndrome)   . Neuropathy due to secondary diabetes mellitus   . Hx of pulmonary embolus     2011--treated with Coumadin    PAST SURGICAL HISTORY:  Past Surgical History  Procedure Laterality Date  . Prostatectomy    . Joint replacement      left knee  . Colonoscopy N/A 10/23/2014    Procedure: COLONOSCOPY;  Surgeon: Lucilla Lame, MD;  Location: [place];  Service: Gastroenterology;  Laterality: N/A;  . Knee arthroscopy w/ meniscal repair Left 2012  . Eus N/A 11/14/2014    Procedure: LOWER ENDOSCOPIC ULTRASOUND (EUS);  Surgeon: Holly Bodily, MD;  Location: [facility] ENDOSCOPY;  Service: Endoscopy;  Laterality: N/A;  . Eye surgery Bilateral     cataract extraction  . Tonsillectomy    . Abdominal perineal bowel resection N/A 12/12/2014    Procedure: ABDOMINAL PERINEAL RESECTION;  Surgeon: [name], MD;  Location: [facility] ORS;  Service: General;  Laterality: N/A;  . Cystoscopy with stent placement Bilateral 12/12/2014    Procedure: cystoscopy, bilateral retrogrades, left ureteral stent placement, attempted right stent placement.;  Surgeon: Hollice Espy, MD;  Location: [facility];  Service: Urology;  Laterality: Bilateral;  . Irrigation and debridement abscess N/A 12/23/2014    Procedure: IRRIGATION AND DEBRIDEMENT ABSCESS/ PERINEAL WOUND DEBRIDMENT;  Surgeon: Marlyce Huge, MD;  Location: [facility];  Service: General;  Laterality: N/A;  . Irrigation and debridement abscess N/A 12/25/2014    Procedure: IRRIGATION AND DEBRIDEMENT ABSCESS;  Surgeon: Marlyce Huge, MD;  Location: [facility];   Service: General;  Laterality: N/A;  . Application of wound vac N/A 12/25/2014    Procedure: APPLICATION OF WOUND VAC;  Surgeon: Marlyce Huge, MD;  Location: [facility];  Service: General;  Laterality: N/A;  . Minor application of wound vac N/A 12/30/2014    Procedure: MINOR APPLICATION OF WOUND VAC;  Surgeon: [name], MD;  Location: [facility];  Service: General;  Laterality: N/A;    ALLERGIES:  has No Known Allergies.  MEDICATIONS:  Current Facility-Administered Medications  Medication Dose Route Frequency Provider Last Rate Last Dose  . acetaminophen (TYLENOL) tablet 650 mg  650 mg  Oral Q6H PRN Dia Crawford III, MD   650 mg at 02/02/15 1847  . dextrose 5 %-0.45 % sodium chloride infusion   Intravenous Continuous Marlyce Huge, MD 75 mL/hr at 02/03/15 0222    . enoxaparin (LOVENOX) injection 40 mg  40 mg Subcutaneous Q24H Dia Crawford III, MD   40 mg at 02/02/15 2253  . ertapenem (INVANZ) 1 g in sodium chloride 0.9 % 50 mL IVPB  1 g Intravenous Q24H Dia Crawford III, MD   1 g at 02/02/15 2253  . famotidine (PEPCID) tablet 20 mg  20 mg Oral Daily [name], MD   20 mg at 02/03/15 1446  . fentaNYL (SUBLIMAZE) injection 25 mcg  25 mcg Intravenous Q5 min PRN Gunnar Bulla, MD      . ferrous sulfate tablet 325 mg  325 mg Oral TID WC Dia Crawford III, MD   325 mg at 02/03/15 1321  . gabapentin (NEURONTIN) capsule 100 mg  100 mg Oral QHS Dia Crawford III, MD   100 mg at 02/02/15 2253  . HYDROcodone-acetaminophen (NORCO/VICODIN) 5-325 MG per tablet 1 tablet  1 tablet Oral Q6H PRN Dia Crawford III, MD      . insulin aspart (novoLOG) injection 0-9 Units  0-9 Units Subcutaneous Q6H Lance Coon, MD   1 Units at 02/02/15 1848  . morphine 2 MG/ML injection 2 mg  2 mg Intravenous Q2H PRN [name], MD      . ondansetron (ZOFRAN-ODT) disintegrating tablet 4 mg  4 mg Oral Q6H PRN Dia Crawford III, MD       Or  . ondansetron [facility]) injection 4 mg  4 mg Intravenous Q6H PRN Dia Crawford III, MD      . ondansetron  [facility]) injection 4 mg  4 mg Intravenous Once PRN Gunnar Bulla, MD        Vital Signs: BP 93/48 mmHg  Pulse 87  Temp(Src) 98.6 F (37 C) (Oral)  Resp 18  Ht 6' 2"  (1.88 m)  Wt 97.523 kg (215 lb)  BMI 27.59 kg/m2  SpO2 98% Filed Weights   02/01/15 1505  Weight: 97.523 kg (215 lb)    Estimated body mass index is 27.59 kg/(m^2) as calculated from the following:   Height as of this encounter: 6' 2"  (1.88 m).   Weight as of this encounter: 97.523 kg (215 lb).  PERFORMANCE STATUS (ECOG) : 4 - Bedbound  PHYSICAL EXAM: Very weak and pale appearing Also, very immobile.  He did not move his head or body at all. EOMI but eyelids are heavy OP clear Neck is supple no JVD Heart rrr no m heard Lungs clear but with decreased BS in bases Abd soft and NT Ext muscle atrophy noted  LABS: CBC:    Component Value Date/Time   WBC 12.7* 02/03/2015 0414   HGB 6.4* 02/03/2015 0414   HCT 20.2* 02/03/2015 0414   PLT 307 02/03/2015 0414   MCV 84.6 02/03/2015 0414   NEUTROABS 21.6* 02/01/2015 1536   LYMPHSABS 0.7* 02/01/2015 1536   MONOABS 1.0 02/01/2015 1536   EOSABS 0.1 02/01/2015 1536   BASOSABS 0.0 02/01/2015 1536   Comprehensive Metabolic Panel:    Component Value Date/Time   NA 131* 02/03/2015 0414   K 4.6 02/03/2015 0414   CL 105 02/03/2015 0414   CO2 22 02/03/2015 0414   BUN 44* 02/03/2015 0414   CREATININE 1.77* 02/03/2015 0414   GLUCOSE 61* 02/03/2015 0414   CALCIUM 7.2* 02/03/2015 0414   AST 95* 02/01/2015  1536   ALT 88* 02/01/2015 1536   ALKPHOS 78 02/01/2015 1536   BILITOT 0.4 02/01/2015 1536   PROT 6.1* 02/01/2015 1536   ALBUMIN 2.0* 02/01/2015 1536   TESTS:  PET scan 11/08/14: 1. Focal hypermetabolism in an area of right-sided rectal wall thickening, compatible with the reported history of rectal neoplasm. 2. Unexpected markedly hypermetabolic 2.5 cm soft tissue lesion in the left oropharyngeal region, causing mass-effect on the oropharynx. Area is  difficult to assess given streak artifact from dental fillings, but this lesion may be related to lymphadenopathy rather than an oropharyngeal mucosal lesion. FDG uptake is in the malignant range. This would be an unusual location for metastatic rectal cancer given the absence of other definite metastatic lesions on this study. 3. Brachytherapy seeds in the prostate bed. No evidence for hypermetabolic bone lesions.  IMPRESSION: 1.  Pre-sacral Abscess presenting with fever and chills and leukocytosis ---Now debrided X2 on 02/03/15  with wound vac placed 2.  Sacral  Ulcer  --development post perineal wound resection with perineal wound necrosis 3.  Severe malnutrition associated with anorexia of cancer 4.  Elevated Liver Function tests 5.  Severe anemia with weakness 6.  Right hydronephrosis ---possibly due to bladder outlet obstruction --hence foley is placed ---possibly duet to partial UVJ stricture (so repeat imaging is to be done by Dr. Man, urology.  ---right ureteral stent could not be placed in July when he had his colon surgery (thought possibly due to distal stricture) 7.  Acute renal failure on CKD stage III ---Baseline Cr = 1.1 and  Cr was in 2.0 -2.5 range ---Cr now 1.75 ---Right hydronephrosis is likely contributing ---He is diabetic and may have advancing medical renal disease  ---Left kidney > right kidney atrophy noted also 8. HTN ---with borderline low BP this adm 9. DM2 with Diabetic Peripheral Neuropathy 10.  DJD with chronic joint pain 11.  MRSA screen positive 12.  Recent perineal resection for stage 11a rectal cancer  ---with complications due to fibrosis/ scarring from prior brachiotherapy for prostate cancer ---with PET scan positive for oral lesion of undetermined etiology (met vs new primary) 13.  History of Prostate Cancer treated with brachiotherapy  14.  Weakness 15.  Anorexia and Failure to Thrive in an adult     See top of note for PLAN  More than  50% of the visit was spent in counseling/coordination of care: Yes  Time Spent: 70 minutes

## 2015-02-03 NOTE — Progress Notes (Signed)
Patient ID: Darryl Hooper., male   DOB: [birth year], 81 y.o.   MRN: [id number]   Assuming care from Dr. Pat Patrick in [place]. The patient is well known to me. He is about 6 weeks status post abdominal perineal resection complicated by perineal wound necrosis significant debilitation and the development of the sacral decubitus ulcer. I have reviewed his CT scan independently. There is indeed a presacral space abscess with air contained in it which likely is emanating from the sacral decubitus ulceration.  I discussed this with him and his family in detail. We will plan for operative debridement and potential entrance of this presacral space from the posterior approach underneath the coccyx and if this is not successful we will proceed with interventional radiology drainage.  In addition there is significant concerns regarding his care decrease sources and I have communicated this with our clinical Education officer, museum.

## 2015-02-03 NOTE — Transfer of Care (Signed)
Immediate Anesthesia Transfer of Care Note  Patient: Darryl Hooper.  Procedure(s) Performed: Procedure(s): irrigation and debridement sacral wound abscess X 2 (N/A)  Patient Location: PACU  Anesthesia Type:General  Level of Consciousness: awake, alert  and oriented  Airway & Oxygen Therapy: Patient Spontanous Breathing and Patient connected to face mask oxygen  Post-op Assessment: Report given to RN and Post -op Vital signs reviewed and stable  Post vital signs: Reviewed and stable  Last Vitals:  Filed Vitals:   02/03/15 1126  BP: 108/54  Pulse: 81  Temp:   Resp: 18    Complications: No apparent anesthesia complications

## 2015-02-03 NOTE — Care Management Important Message (Signed)
Important Message  Patient Details  Name: Darryl Hooper. MRN: [id number] Date of Birth: 11/09/1929   Medicare Important Message Given:  Yes-second notification given    Alvie Heidelberg, RN 02/03/2015, 11:28 AM

## 2015-02-03 NOTE — Progress Notes (Signed)
[facility] NAME: Cher Egnor    MR#:  [id number]  DATE OF BIRTH:  07/07/1929  SUBJECTIVE:  CHIEF COMPLAINT:   Chief Complaint  Patient presents with  . Failure To Thrive  . URI   seen in PACU, awake , lethargic REVIEW OF SYSTEMS:  Review of Systems  Unable to perform ROS: acuity of condition   DRUG ALLERGIES:  No Known Allergies VITALS:  Blood pressure 101/42, pulse 85, temperature 97.2 F (36.2 C), temperature source Oral, resp. rate 18, height 6\' 2"  (1.88 m), weight 97.523 kg (215 lb), SpO2 96 %. PHYSICAL EXAMINATION:  Physical Exam  Constitutional: He is oriented to person, place, and time. He appears lethargic, malnourished and dehydrated. He appears unhealthy. He appears cachectic. He appears toxic. He has a sickly appearance.  HENT:  Head: Normocephalic and atraumatic.  Eyes: Conjunctivae and EOM are normal. Pupils are equal, round, and reactive to light.  Neck: Normal range of motion. Neck supple. No tracheal deviation present. No thyromegaly present.  Cardiovascular: Normal rate, regular rhythm and normal heart sounds.   Pulmonary/Chest: Effort normal and breath sounds normal. No respiratory distress. He has no wheezes. He exhibits no tenderness.  Abdominal: Soft. Bowel sounds are normal. He exhibits no distension. There is no tenderness.  Musculoskeletal: Normal range of motion.  Neurological: He is oriented to person, place, and time. He appears lethargic. No cranial nerve deficit.  Skin: Skin is warm and dry. No rash noted.  Sacral decubitus    Psychiatric: Mood, affect and judgment normal.   LABORATORY PANEL:   CBC  Recent Labs Lab 02/03/15 0414  WBC 12.7*  HGB 6.4*  HCT 20.2*  PLT 307   ------------------------------------------------------------------------------------------------------------------ Chemistries   Recent Labs Lab 02/01/15 1536  02/03/15 0414  NA 131*  < > 131*  K 5.4*  <  > 4.6  CL 103  < > 105  CO2 21*  < > 22  GLUCOSE 132*  < > 61*  BUN 65*  < > 44*  CREATININE 2.02*  < > 1.77*  CALCIUM 8.5*  < > 7.2*  AST 95*  --   --   ALT 88*  --   --   ALKPHOS 78  --   --   BILITOT 0.4  --   --   < > = values in this interval not displayed. RADIOLOGY:  No results found. ASSESSMENT AND PLAN:   * Hypoglycemia with a history of Diabetes mellitus, type 2 - follow hypoglycemia protocols while in the hospital. hold home diabetes medications for now, sliding scale insulin every 6 hours with appropriate fingerstick glucose checks while patient is nothing by mouth, this will need to be changed to before meals at bedtime once the patient is eating a carb modified diet again.  consult diabetic nurse coordinator  *Hypotension with a history of Benign essential HTN - blood pressure is on the low side, hold home antihypertensives for now  * Anemia: Likely acute on chronic blood loss, will go ahead and order 1 unit of packed red blood cell transfusion if already not given as his hemoglobin is down to 6.4  * CKD stage III: close to baseline.  We will monitor  * Arthritis - appropriate pain management for this in conjunction with pain management of his above principal problem  * Abscess of male pelvis - irrigation and debridement sacral wound abscess X 2 and placement of wound VAC, management per surgery  All the records are reviewed and case discussed with Care Management/Social Worker. Management plans discussed with the patient, family and they are in agreement.  CODE STATUS: Full code  TOTAL TIME TAKING CARE OF THIS PATIENT: 15 minutes.   More than 50% of the time was spent in counseling/coordination of care: Augustina Mood, Eloyce Bultman M.D on 02/03/2015 at 1:03 PM  Between 7am to 6pm - Pager - [phone]  After 6pm go to [email or web address] - password [email or web address] [facility]  Office  [phone]  CC:  Primary care physician; Dicky Doe, MD

## 2015-02-03 NOTE — Anesthesia Preprocedure Evaluation (Addendum)
Anesthesia Evaluation  Patient identified by MRN, date of birth, ID band Patient awake    Reviewed: Allergy & Precautions, NPO status , Patient's Chart, lab work & pertinent test results, reviewed documented beta blocker date and time   Airway Mallampati: II  TM Distance: >3 FB     Dental  (+) Chipped   Pulmonary          Cardiovascular hypertension, Pt. on medications + Peripheral Vascular Disease     Neuro/Psych  Neuromuscular disease    GI/Hepatic GERD-  Controlled and Medicated,  Endo/Other  diabetes, Type 2  Renal/GU Renal InsufficiencyRenal disease     Musculoskeletal  (+) Arthritis -,   Abdominal   Peds  Hematology   Anesthesia Other Findings Peripheral neuropathy.  Reproductive/Obstetrics                            Anesthesia Physical Anesthesia Plan  ASA: III  Anesthesia Plan: General   Post-op Pain Management:    Induction: Intravenous  Airway Management Planned: Oral ETT  Additional Equipment:   Intra-op Plan:   Post-operative Plan:   Informed Consent: I have reviewed the patients History and Physical, chart, labs and discussed the procedure including the risks, benefits and alternatives for the proposed anesthesia with the patient or authorized representative who has indicated his/her understanding and acceptance.     Plan Discussed with: CRNA  Anesthesia Plan Comments:        Anesthesia Quick Evaluation

## 2015-02-03 NOTE — Anesthesia Procedure Notes (Signed)
Procedure Name: Intubation Date/Time: 02/03/2015 10:14 AM Performed by: Aline Brochure Pre-anesthesia Checklist: Patient identified, Emergency Drugs available, Suction available and Patient being monitored Patient Re-evaluated:Patient Re-evaluated prior to inductionOxygen Delivery Method: Circle system utilized Preoxygenation: Pre-oxygenation with 100% oxygen Intubation Type: IV induction Ventilation: Mask ventilation without difficulty Laryngoscope Size: McGraph and 4 Grade View: Grade I Tube type: Oral Tube size: 7.5 mm Number of attempts: 2 Airway Equipment and Method: Stylet Placement Confirmation: ETT inserted through vocal cords under direct vision,  positive ETCO2 and breath sounds checked- equal and bilateral Secured at: 23 cm Tube secured with: Tape Dental Injury: Teeth and Oropharynx as per pre-operative assessment

## 2015-02-03 NOTE — Progress Notes (Addendum)
Inpatient Diabetes Program Recommendations  AACE/ADA: New Consensus Statement on Inpatient Glycemic Control (2013)  Target Ranges:  Prepandial:   less than 140 mg/dL      Peak postprandial:   less than 180 mg/dL (1-2 hours)      Critically ill patients:  140 - 180 mg/dL   Reason for Glycemic Control Review: Consult from Dr. Shah/Hypoglycemia/Insulin and DM teaching  Diabetes history: DM 2 Outpatient Diabetes medications: Amaryl 4 mg Daily Current orders for Inpatient glycemic control: Novolog Sensitive scale Q4hrs  Inpatient Diabetes Program Recommendations Oral Agents: Due to hypoglycemia on admission, recommend considering discontinuing Amaryl 4 mg from home meds due to age and other medical co morbidities if having hypoglycemia at home. If not may want to decrease dose. Glucose controlled here with Novolog Sensitive correction, however patient is not requiring much insulin from that correction scale.Patient ate 100% of meals at lunch and dinner yesterday.  HgbA1C: 7% on 02/01/2015. Due to age and co-morbidities great control. Would not lower A1c more due to risk of hypoglycemia at home and age.   Thanks,  Tama Headings RN, MSN, [facility] Inpatient Diabetes Coordinator Team Pager [phone]

## 2015-02-03 NOTE — Consult Note (Signed)
Reason for Consult: Right Mild Hydronephrosis, Chronic Renal Insufficiency, Prostate Cancer  Referring Physician: [name] MD  Darryl Richter Woodard Perrell. is an 81 y.o. male.   HPI:   1 - Right Mild Hydronephrosis / Rt Distal Ureteral Abnormality - new mild right hydro by CT 01/2015 on eval sacral abscess. Mild hydro to markedly distended bladder w/o obvious mass / stone / foreign body. This does appear new since prior study 2011. He did have attempted Rt ureteral stent by Dr. Erlene Quan 11/2014 at time of initial colon cancer surgery at which time unable to place stent, felt possible distal stricture.  2 - Chronic Renal Insufficiency - Cr 1.1 range prior to 11/2013, now 2-2.5 range. Mild right hydro as per above by CT 01/2015. He is diabetic and known PAD as well as some Lt>Rt renal atrophy.  3 -  Prostate Cancer - s/p brachyterapy  For unknown grade / stage disease. His prostate is in situ with brachytherapy seeds by CT 01/2015. No recent PSA's.   PMH sig for rectal cancer, DM2, chronic buttocks wound.  Today "Darryl Hooper" is seen in consultation for above, specifically for furhter evaluation and management of his mild right hydro. He is presently admitted with recurrent pelvic / sacral abscess and current plan is likely serial operative debridement.   Past Medical History  Diagnosis Date  . Hypertension   . Peripheral vascular disease   . Hyperlipidemia   . Diarrhea   . Diabetes mellitus without complication   . BPH (benign prostatic hyperplasia)   . Prostate cancer   . Arthritis   . Knee pain, bilateral   . Rectal cancer   . Chronic kidney disease     incontience  . IBS (irritable bowel syndrome)   . Neuropathy due to secondary diabetes mellitus   . Hx of pulmonary embolus     2011--treated with Coumadin    Past Surgical History  Procedure Laterality Date  . Prostatectomy    . Joint replacement      left knee  . Colonoscopy N/A 10/23/2014    Procedure: COLONOSCOPY;  Surgeon: Lucilla Lame, MD;   Location: [facility];  Service: Gastroenterology;  Laterality: N/A;  . Knee arthroscopy w/ meniscal repair Left 2012  . Eus N/A 11/14/2014    Procedure: LOWER ENDOSCOPIC ULTRASOUND (EUS);  Surgeon: Holly Bodily, MD;  Location[facility] ENDOSCOPY;  Service: Endoscopy;  Laterality: N/A;  . Eye surgery Bilateral     cataract extraction  . Tonsillectomy    . Abdominal perineal bowel resection N/A 12/12/2014    Procedure: ABDOMINAL PERINEAL RESECTION;  Surgeon: [name], MD;  Location: [facility];  Service: General;  Laterality: N/A;  . Cystoscopy with stent placement Bilateral 12/12/2014    Procedure: cystoscopy, bilateral retrogrades, left ureteral stent placement, attempted right stent placement.;  Surgeon: Hollice Espy, MD;  Location: [facility];  Service: Urology;  Laterality: Bilateral;  . Irrigation and debridement abscess N/A 12/23/2014    Procedure: IRRIGATION AND DEBRIDEMENT ABSCESS/ PERINEAL WOUND DEBRIDMENT;  Surgeon: Marlyce Huge, MD;  Location: [facility];  Service: General;  Laterality: N/A;  . Irrigation and debridement abscess N/A 12/25/2014    Procedure: IRRIGATION AND DEBRIDEMENT ABSCESS;  Surgeon: Marlyce Huge, MD;  Location: [facility];  Service: General;  Laterality: N/A;  . Application of wound vac N/A 12/25/2014    Procedure: APPLICATION OF WOUND VAC;  Surgeon: Marlyce Huge, MD;  Location: [facility];  Service: General;  Laterality: N/A;  . Minor application of wound vac N/A  12/30/2014    Procedure: MINOR APPLICATION OF WOUND VAC;  Surgeon: [name], MD;  Location: [facility];  Service: General;  Laterality: N/A;    Family History  Problem Relation Age of Onset  . Heart disease Mother   . Heart disease Father   . Cancer Father 48    Colon  . Multiple sclerosis Son     Social History:  reports that he has never smoked. He has never used smokeless tobacco. He reports that he does not drink alcohol or use illicit drugs.  Allergies: No Known  Allergies  Medications: I have reviewed the patient's current medications.  Results for orders placed or performed during the hospital encounter of 02/01/15 (from the past 48 hour(s))  Comprehensive metabolic panel     Status: Abnormal   Collection Time: 02/01/15  3:36 PM  Result Value Ref Range   Sodium 131 (L) 135 - 145 mmol/L   Potassium 5.4 (H) 3.5 - 5.1 mmol/L   Chloride 103 101 - 111 mmol/L   CO2 21 (L) 22 - 32 mmol/L   Glucose, Bld 132 (H) 65 - 99 mg/dL   BUN 65 (H) 6 - 20 mg/dL   Creatinine, Ser 2.02 (H) 0.61 - 1.24 mg/dL   Calcium 8.5 (L) 8.9 - 10.3 mg/dL   Total Protein 6.1 (L) 6.5 - 8.1 g/dL   Albumin 2.0 (L) 3.5 - 5.0 g/dL   AST 95 (H) 15 - 41 U/L   ALT 88 (H) 17 - 63 U/L   Alkaline Phosphatase 78 38 - 126 U/L   Total Bilirubin 0.4 0.3 - 1.2 mg/dL   GFR calc non Af Amer 29 (L) >60 mL/min   GFR calc Af Amer 33 (L) >60 mL/min    Comment: (NOTE) The eGFR has been calculated using the CKD EPI equation. This calculation has not been validated in all clinical situations. eGFR's persistently <60 mL/min signify possible Chronic Kidney Disease.    Anion gap 7 5 - 15  Lipase, blood     Status: None   Collection Time: 02/01/15  3:36 PM  Result Value Ref Range   Lipase 28 22 - 51 U/L  Troponin I     Status: None   Collection Time: 02/01/15  3:36 PM  Result Value Ref Range   Troponin I <0.03 <0.031 ng/mL    Comment:        NO INDICATION OF MYOCARDIAL INJURY.   Lactic acid, plasma     Status: Abnormal   Collection Time: 02/01/15  3:36 PM  Result Value Ref Range   Lactic Acid, Venous 2.3 (HH) 0.5 - 2.0 mmol/L    Comment: CRITICAL RESULT CALLED TO, READ BACK BY AND VERIFIED WITH TERRI BROGAN 02/01/15 1616 SJL   CBC with Differential     Status: Abnormal   Collection Time: 02/01/15  3:36 PM  Result Value Ref Range   WBC 23.5 (H) 3.8 - 10.6 K/uL   RBC 3.17 (L) 4.40 - 5.90 MIL/uL   Hemoglobin 8.5 (L) 13.0 - 18.0 g/dL   HCT 26.8 (L) 40.0 - 52.0 %   MCV 84.4 80.0 -  100.0 fL   MCH 26.9 26.0 - 34.0 pg   MCHC 31.8 (L) 32.0 - 36.0 g/dL   RDW 16.1 (H) 11.5 - 14.5 %   Platelets 424 150 - 440 K/uL   Neutrophils Relative % 93 %   Neutro Abs 21.6 (H) 1.4 - 6.5 K/uL   Lymphocytes Relative 3 %   Lymphs Abs  0.7 (L) 1.0 - 3.6 K/uL   Monocytes Relative 4 %   Monocytes Absolute 1.0 0.2 - 1.0 K/uL   Eosinophils Relative 0 %   Eosinophils Absolute 0.1 0 - 0.7 K/uL   Basophils Relative 0 %   Basophils Absolute 0.0 0 - 0.1 K/uL  Culture, blood (routine x 2)     Status: None (Preliminary result)   Collection Time: 02/01/15  3:36 PM  Result Value Ref Range   Specimen Description BLOOD RIGHT ASSIST CONTROL    Special Requests BOTTLES DRAWN AEROBIC AND ANAEROBIC  5CC    Culture NO GROWTH 2 DAYS    Report Status PENDING   Hemoglobin A1c     Status: Abnormal   Collection Time: 02/01/15  3:36 PM  Result Value Ref Range   Hgb A1c MFr Bld 7.0 (H) 4.0 - 6.0 %  Culture, blood (routine x 2)     Status: None (Preliminary result)   Collection Time: 02/01/15  3:42 PM  Result Value Ref Range   Specimen Description BLOOD RIGHT WRIST    Special Requests BOTTLES DRAWN AEROBIC AND ANAEROBIC  3CC ANA 5CC    Culture NO GROWTH 2 DAYS    Report Status PENDING   Urinalysis complete, with microscopic     Status: Abnormal   Collection Time: 02/01/15  6:30 PM  Result Value Ref Range   Color, Urine YELLOW (A) YELLOW   APPearance CLEAR (A) CLEAR   Glucose, UA NEGATIVE NEGATIVE mg/dL   Bilirubin Urine NEGATIVE NEGATIVE   Ketones, ur NEGATIVE NEGATIVE mg/dL   Specific Gravity, Urine 1.016 1.005 - 1.030   Hgb urine dipstick NEGATIVE NEGATIVE   pH 5.0 5.0 - 8.0   Protein, ur NEGATIVE NEGATIVE mg/dL   Nitrite NEGATIVE NEGATIVE   Leukocytes, UA NEGATIVE NEGATIVE   RBC / HPF NONE SEEN 0 - 5 RBC/hpf   WBC, UA 0-5 0 - 5 WBC/hpf   Bacteria, UA RARE (A) NONE SEEN   Squamous Epithelial / LPF NONE SEEN NONE SEEN  Urine culture     Status: None (Preliminary result)   Collection Time:  02/01/15  6:30 PM  Result Value Ref Range   Specimen Description URINE, CLEAN CATCH    Special Requests Normal    Culture NO GROWTH < 24 HOURS    Report Status PENDING   Glucose, capillary     Status: None   Collection Time: 02/01/15 10:36 PM  Result Value Ref Range   Glucose-Capillary 86 65 - 99 mg/dL   Comment 1 Notify RN   Glucose, capillary     Status: None   Collection Time: 02/02/15 12:42 AM  Result Value Ref Range   Glucose-Capillary 93 65 - 99 mg/dL  Glucose, capillary     Status: Abnormal   Collection Time: 02/02/15  5:28 AM  Result Value Ref Range   Glucose-Capillary 36 (LL) 65 - 99 mg/dL   Comment 1 Repeat Test   Glucose, capillary     Status: Abnormal   Collection Time: 02/02/15  5:30 AM  Result Value Ref Range   Glucose-Capillary 37 (LL) 65 - 99 mg/dL   Comment 1 Notify RN   Glucose, capillary     Status: Abnormal   Collection Time: 02/02/15  5:55 AM  Result Value Ref Range   Glucose-Capillary 39 (LL) 65 - 99 mg/dL   Comment 1 Repeat Test   Glucose, capillary     Status: Abnormal   Collection Time: 02/02/15  6:32 AM  Result Value Ref  Range   Glucose-Capillary 52 (L) 65 - 99 mg/dL  Glucose, capillary     Status: Abnormal   Collection Time: 02/02/15  6:59 AM  Result Value Ref Range   Glucose-Capillary 58 (L) 65 - 99 mg/dL   Comment 1 Notify RN   Basic metabolic panel     Status: Abnormal   Collection Time: 02/02/15  7:14 AM  Result Value Ref Range   Sodium 133 (L) 135 - 145 mmol/L   Potassium 4.6 3.5 - 5.1 mmol/L   Chloride 107 101 - 111 mmol/L   CO2 20 (L) 22 - 32 mmol/L   Glucose, Bld 81 65 - 99 mg/dL   BUN 53 (H) 6 - 20 mg/dL   Creatinine, Ser 1.80 (H) 0.61 - 1.24 mg/dL   Calcium 7.8 (L) 8.9 - 10.3 mg/dL   GFR calc non Af Amer 33 (L) >60 mL/min   GFR calc Af Amer 38 (L) >60 mL/min    Comment: (NOTE) The eGFR has been calculated using the CKD EPI equation. This calculation has not been validated in all clinical situations. eGFR's persistently <60  mL/min signify possible Chronic Kidney Disease.    Anion gap 6 5 - 15  CBC     Status: Abnormal   Collection Time: 02/02/15  7:14 AM  Result Value Ref Range   WBC 17.3 (H) 3.8 - 10.6 K/uL   RBC 2.91 (L) 4.40 - 5.90 MIL/uL   Hemoglobin 7.7 (L) 13.0 - 18.0 g/dL   HCT 24.7 (L) 40.0 - 52.0 %   MCV 84.9 80.0 - 100.0 fL   MCH 26.4 26.0 - 34.0 pg   MCHC 31.1 (L) 32.0 - 36.0 g/dL   RDW 15.6 (H) 11.5 - 14.5 %   Platelets 366 150 - 440 K/uL  Glucose, capillary     Status: None   Collection Time: 02/02/15  7:41 AM  Result Value Ref Range   Glucose-Capillary 71 65 - 99 mg/dL   Comment 1 Notify RN   Glucose, capillary     Status: Abnormal   Collection Time: 02/02/15 11:31 AM  Result Value Ref Range   Glucose-Capillary 207 (H) 65 - 99 mg/dL   Comment 1 Notify RN   Glucose, capillary     Status: Abnormal   Collection Time: 02/02/15  6:30 PM  Result Value Ref Range   Glucose-Capillary 134 (H) 65 - 99 mg/dL   Comment 1 Notify RN   Glucose, capillary     Status: None   Collection Time: 02/02/15 11:15 PM  Result Value Ref Range   Glucose-Capillary 83 65 - 99 mg/dL  CBC     Status: Abnormal   Collection Time: 02/03/15  4:14 AM  Result Value Ref Range   WBC 12.7 (H) 3.8 - 10.6 K/uL   RBC 2.38 (L) 4.40 - 5.90 MIL/uL   Hemoglobin 6.4 (L) 13.0 - 18.0 g/dL   HCT 20.2 (L) 40.0 - 52.0 %   MCV 84.6 80.0 - 100.0 fL   MCH 26.9 26.0 - 34.0 pg   MCHC 31.8 (L) 32.0 - 36.0 g/dL   RDW 15.7 (H) 11.5 - 14.5 %   Platelets 307 150 - 440 K/uL  Basic metabolic panel     Status: Abnormal   Collection Time: 02/03/15  4:14 AM  Result Value Ref Range   Sodium 131 (L) 135 - 145 mmol/L   Potassium 4.6 3.5 - 5.1 mmol/L   Chloride 105 101 - 111 mmol/L   CO2 22 22 -  32 mmol/L   Glucose, Bld 61 (L) 65 - 99 mg/dL   BUN 44 (H) 6 - 20 mg/dL   Creatinine, Ser 1.77 (H) 0.61 - 1.24 mg/dL   Calcium 7.2 (L) 8.9 - 10.3 mg/dL   GFR calc non Af Amer 34 (L) >60 mL/min   GFR calc Af Amer 39 (L) >60 mL/min    Comment:  (NOTE) The eGFR has been calculated using the CKD EPI equation. This calculation has not been validated in all clinical situations. eGFR's persistently <60 mL/min signify possible Chronic Kidney Disease.    Anion gap 4 (L) 5 - 15  Glucose, capillary     Status: None   Collection Time: 02/03/15  5:49 AM  Result Value Ref Range   Glucose-Capillary 74 65 - 99 mg/dL  Surgical pcr screen     Status: Abnormal   Collection Time: 02/03/15  6:11 AM  Result Value Ref Range   MRSA, PCR POSITIVE (A) NEGATIVE    Comment: CRITICAL RESULT CALLED TO, READ BACK BY AND VERIFIED WITH: ABBEY GOODMAN ON 02/03/15 AT 0734 BY QSD    Staphylococcus aureus POSITIVE (A) NEGATIVE    Comment:        The Xpert SA Assay (FDA approved for NASAL specimens in patients over 21 years of age), is one component of a comprehensive surveillance program.  Test performance has been validated by [facility] for patients greater than or equal to 2 year old. It is not intended to diagnose infection nor to guide or monitor treatment.     Ct Abdomen Pelvis Wo Contrast  02/01/2015   CLINICAL DATA:  Acute onset of generalized abdominal pain and cough. Pressure ulcer along the back. Shaking. Initial encounter.  EXAM: CT CHEST, ABDOMEN AND PELVIS WITHOUT CONTRAST  TECHNIQUE: Multidetector CT imaging of the chest, abdomen and pelvis was performed following the standard protocol without IV contrast.  COMPARISON:  PET/CT performed 11/08/2014  FINDINGS: CT CHEST FINDINGS  Minimal bibasilar atelectasis is noted. The lungs are otherwise clear. No focal consolidation, pleural effusion or pneumothorax is seen. No masses are identified.  Scattered coronary artery calcifications are seen. Scattered calcification is noted at the mitral and aortic valves. The mediastinum is otherwise unremarkable. No mediastinal lymphadenopathy is seen. No pericardial effusion is seen. The great vessels are grossly unremarkable. The thyroid gland is unremarkable  in appearance. No axillary lymphadenopathy is appreciated.  No acute osseous abnormalities are identified.  CT ABDOMEN AND PELVIS FINDINGS  The liver and spleen are unremarkable in appearance. The gallbladder is within normal limits. The pancreas and adrenal glands are unremarkable.  There is moderate right-sided hydronephrosis, new from the prior study. Nonspecific perinephric stranding is noted bilaterally, and left renal atrophy and scarring are seen. No obstructing ureteral stone is seen; this may reflect a recently passed stone. A nonobstructing 3 mm stone is noted at the interpole region of the right kidney.  No free fluid is identified. The small bowel is unremarkable in appearance. The stomach is within normal limits. No acute vascular abnormalities are seen. Scattered calcification is noted along the abdominal aorta and its branches.  The appendix is not definitely seen; there is no evidence for appendicitis. The patient is status post resection of the distal sigmoid colon and rectum, with a left lower quadrant colostomy. The colostomy is unremarkable in appearance.  There is a 5.9 x 3.1 cm collection of fluid and air at the presacral space, abutting the sacrum and coccyx, and compatible with an abscess. This  appears somewhat contiguous with the large sacral decubitus ulceration, which measures approximately 4.0 cm in depth and 11.9 cm in length, with a defect in the coccyx at its superior edge. Underlying osteomyelitis is a concern. Surrounding soft tissue inflammation extends within the lower pelvis.  There appears to be a residual anterior soft tissue wound, with minimal air tracking superiorly and laterally along the fascia. A nearby collection of fluid along the right mid anterior abdominal wall, measuring 4.0 x 1.5 cm, may reflect recent injury or possibly a postoperative seroma.  The bladder is moderately distended and grossly unremarkable. Scattered prostate brachytherapy seeds are seen. No inguinal  lymphadenopathy is seen.  No acute osseous abnormalities are identified. Multilevel degenerative change is noted along the lower lumbar spine, with chronic bilateral pars defects at L5, and mild grade 1 anterolisthesis of L5 on S1.  IMPRESSION: 1. Status post resection of the distal sigmoid colon and rectum, there is a 5.9 x 3.1 cm collection of fluid and air at the presacral space, abutting the sacrum and coccyx, compatible with an abscess. This appears somewhat contiguous with the large sacral decubitus ulceration, which measures 4.0 cm in depth and 11.9 cm in length, with a defect in the coccyx at its superior edge. Underlying osteomyelitis is a concern, but is not well assessed on CT. Surrounding soft tissue inflammation within the lower pelvis. 2. Moderate right-sided hydronephrosis, new from the prior study. No obstructing ureteral stone. This may reflect a recently passed stone, as there is no additional finding seen to suggest obstruction. Would correlate with the patient's symptoms. 3. Residual anterior soft tissue wound, with minimal associated air tracking superiorly and laterally along the anterior abdominal wall fascia. Nearby collection of fluid along the right mid anterior abdominal wall, measuring 4.0 x 1.5 cm, may reflect recent soft tissue injury or possibly a postoperative seroma. 4. Left renal atrophy and scarring noted. Nonobstructing 3 mm stone at the interpole region of the right kidney. 5. Scattered coronary artery calcifications noted. Scattered calcification at the mitral and aortic valves. 6. Scattered calcification along the abdominal aorta and its branches. 7. The patient's left lower quadrant colostomy is unremarkable in appearance. 8. Mild degenerative change along the lower lumbar spine, with chronic bilateral pars defects at L5, and mild grade 1 anterolisthesis of L5 on S1. These results were called by telephone at the time of interpretation on 02/01/2015 at 6:40 pm to Dr. Conni Slipper, who verbally acknowledged these results.   Electronically Signed   By: Garald Balding M.D.   On: 02/01/2015 18:43   Ct Chest Wo Contrast  02/01/2015   CLINICAL DATA:  Acute onset of generalized abdominal pain and cough. Pressure ulcer along the back. Shaking. Initial encounter.  EXAM: CT CHEST, ABDOMEN AND PELVIS WITHOUT CONTRAST  TECHNIQUE: Multidetector CT imaging of the chest, abdomen and pelvis was performed following the standard protocol without IV contrast.  COMPARISON:  PET/CT performed 11/08/2014  FINDINGS: CT CHEST FINDINGS  Minimal bibasilar atelectasis is noted. The lungs are otherwise clear. No focal consolidation, pleural effusion or pneumothorax is seen. No masses are identified.  Scattered coronary artery calcifications are seen. Scattered calcification is noted at the mitral and aortic valves. The mediastinum is otherwise unremarkable. No mediastinal lymphadenopathy is seen. No pericardial effusion is seen. The great vessels are grossly unremarkable. The thyroid gland is unremarkable in appearance. No axillary lymphadenopathy is appreciated.  No acute osseous abnormalities are identified.  CT ABDOMEN AND PELVIS FINDINGS  The liver and spleen  are unremarkable in appearance. The gallbladder is within normal limits. The pancreas and adrenal glands are unremarkable.  There is moderate right-sided hydronephrosis, new from the prior study. Nonspecific perinephric stranding is noted bilaterally, and left renal atrophy and scarring are seen. No obstructing ureteral stone is seen; this may reflect a recently passed stone. A nonobstructing 3 mm stone is noted at the interpole region of the right kidney.  No free fluid is identified. The small bowel is unremarkable in appearance. The stomach is within normal limits. No acute vascular abnormalities are seen. Scattered calcification is noted along the abdominal aorta and its branches.  The appendix is not definitely seen; there is no evidence for  appendicitis. The patient is status post resection of the distal sigmoid colon and rectum, with a left lower quadrant colostomy. The colostomy is unremarkable in appearance.  There is a 5.9 x 3.1 cm collection of fluid and air at the presacral space, abutting the sacrum and coccyx, and compatible with an abscess. This appears somewhat contiguous with the large sacral decubitus ulceration, which measures approximately 4.0 cm in depth and 11.9 cm in length, with a defect in the coccyx at its superior edge. Underlying osteomyelitis is a concern. Surrounding soft tissue inflammation extends within the lower pelvis.  There appears to be a residual anterior soft tissue wound, with minimal air tracking superiorly and laterally along the fascia. A nearby collection of fluid along the right mid anterior abdominal wall, measuring 4.0 x 1.5 cm, may reflect recent injury or possibly a postoperative seroma.  The bladder is moderately distended and grossly unremarkable. Scattered prostate brachytherapy seeds are seen. No inguinal lymphadenopathy is seen.  No acute osseous abnormalities are identified. Multilevel degenerative change is noted along the lower lumbar spine, with chronic bilateral pars defects at L5, and mild grade 1 anterolisthesis of L5 on S1.  IMPRESSION: 1. Status post resection of the distal sigmoid colon and rectum, there is a 5.9 x 3.1 cm collection of fluid and air at the presacral space, abutting the sacrum and coccyx, compatible with an abscess. This appears somewhat contiguous with the large sacral decubitus ulceration, which measures 4.0 cm in depth and 11.9 cm in length, with a defect in the coccyx at its superior edge. Underlying osteomyelitis is a concern, but is not well assessed on CT. Surrounding soft tissue inflammation within the lower pelvis. 2. Moderate right-sided hydronephrosis, new from the prior study. No obstructing ureteral stone. This may reflect a recently passed stone, as there is no  additional finding seen to suggest obstruction. Would correlate with the patient's symptoms. 3. Residual anterior soft tissue wound, with minimal associated air tracking superiorly and laterally along the anterior abdominal wall fascia. Nearby collection of fluid along the right mid anterior abdominal wall, measuring 4.0 x 1.5 cm, may reflect recent soft tissue injury or possibly a postoperative seroma. 4. Left renal atrophy and scarring noted. Nonobstructing 3 mm stone at the interpole region of the right kidney. 5. Scattered coronary artery calcifications noted. Scattered calcification at the mitral and aortic valves. 6. Scattered calcification along the abdominal aorta and its branches. 7. The patient's left lower quadrant colostomy is unremarkable in appearance. 8. Mild degenerative change along the lower lumbar spine, with chronic bilateral pars defects at L5, and mild grade 1 anterolisthesis of L5 on S1. These results were called by telephone at the time of interpretation on 02/01/2015 at 6:40 pm to Dr. Conni Slipper, who verbally acknowledged these results.   Electronically Signed  By: Garald Balding M.D.   On: 02/01/2015 18:43    Review of Systems  Constitutional: Positive for malaise/fatigue. Negative for fever and chills.  Eyes: Negative.   Respiratory: Negative.   Cardiovascular: Negative.  Negative for chest pain.  Gastrointestinal: Negative.  Negative for nausea and vomiting.  Genitourinary: Negative.  Negative for hematuria and flank pain.  Musculoskeletal: Negative.   Skin: Negative.   Neurological: Negative.   Endo/Heme/Allergies: Negative.   Psychiatric/Behavioral: Negative.    Blood pressure 116/50, pulse 65, temperature 98.9 F (37.2 C), temperature source Oral, resp. rate 17, height _0  (1.88 m), weight 215 lb (97.523 kg), SpO2 98 %. Physical Exam  Constitutional:  Elderly. Tired from recent anasthetis / surgery earlier today. Buttocks area wound vac in place with scant  output.   HENT:  Head: Normocephalic.  Eyes: Pupils are equal, round, and reactive to light.  Neck: Normal range of motion.  Cardiovascular: Normal rate.   Respiratory: Effort normal.  GI: Soft.  LLQ colosotmy with stool in appliance.   Genitourinary:  Foley c/d/i with clear urine. No CVAt.   Musculoskeletal: Normal range of motion.  Neurological: He is alert.  Skin: Skin is warm.    Assessment/Plan:   1 - Right Mild Hydronephrosis - most likely some element of bladder outlet obstruction as extends to distended bladder, though I am somewhat concerned about possible partial UVJ stricture. Rec keep foley catheter in place and repeat imaging with [place] likely 9/6 as well as serial labs. If hydro and GFR do not improve, then would likely benefit from possible re-attempt right ureteral stent this admission, possibly coordinated with gen surg operative debridement.  2 - Chronic Renal Insufficiency - likely some element of obsruction (as per above) in setting of medical renal disease. Plan as per above.   3 -  Prostate Cancer - PSA today for new baseline.     Ashtynn Berke 02/03/2015, 9:49 AM

## 2015-02-03 NOTE — Progress Notes (Signed)
Notified Dr. Marina Gravel of positive blood cultures

## 2015-02-03 NOTE — Op Note (Signed)
02/01/2015 - 02/03/2015  11:15 AM  PATIENT:  Darryl Hooper.  81 y.o. male  PRE-OPERATIVE DIAGNOSIS:  sacral wound abscess, presacral abscess.  POST-OPERATIVE DIAGNOSIS:  sacral wound abscess, presacral abscess.  PROCEDURE:  Procedure(s): irrigation and debridement sacral wound abscess X 2 (N/A) excisional initialplacement of wound VAc assisted closure device greater than 50 cm2.  SURGEON:  Surgeon(s) and Role:    [name], MD - Primary  ASSISTANTS: scrub tech  ANESTHESIA: general     SPECIMEN: none     EBL: 50 cc's.  Description of procedure:    With informed consent supine position and general endotracheal anesthesia the patient was in position and padded in prone jackknife. The existing dressing was removed. Sterile prep and drape with Betadine was performed. Timeout was observed.   Sharp excisional debridement was performed with a combination of scalpel, Mayo scissors and large D&C curette. There was an existing skin bridge between the old surgical wound in the new decubitus ulceration which was opened. Thus creating one wound measuring 15 cm in craniocaudad direction 5 cm wide and 4 cm deep. Just inferior to the coccyx I incised in a transverse orientation utilizing electrocautery dropping into a large presacral abscess with thick white pus being drained and irrigated. 1 inch wide Penrose drain was placed into the depths of the abscess cavity. A black piece of granular foam was placed followed by a Bioclusive film track pad and negative pressure was achieved. The patient was then returned supine extubated and taken to the recovery room in stable and satisfactory condition.  The old surgical wound was granulating nicely.   Lizzie An A. Marina Gravel M.D. FACS

## 2015-02-03 NOTE — Progress Notes (Signed)
Notified Dr. Marina Gravel of MRSA PCR coming back positive. Will put patient on contact precaution.

## 2015-02-03 NOTE — Clinical Social Work Note (Signed)
Clinical Social Work Assessment  Patient Details  Name: Darryl Hooper. MRN: [id number] Date of Birth: [birth year]  Date of referral:  02/03/15               Reason for consult:  Facility Placement                Permission sought to share information with:  Family Supports, Chartered certified accountant granted to share information::  Yes, Verbal Permission Granted  Name::        Agency::     Relationship::     Contact Information:     Housing/Transportation Living arrangements for the past 2 months:  [place] of Information:  Patient, Spouse, Adult Children Patient Interpreter Needed:  None Criminal Activity/Legal Involvement Pertinent to Current Situation/Hospitalization:  No - Comment as needed Significant Relationships:  Adult Children, Spouse Lives with:  Facility Resident Do you feel safe going back to the place where you live?  No Need for family participation in patient care:  Yes (Comment)  Care giving concerns:  Patient is known to this CSW from a previous admission in which patient was placed at [facility] per family request for short term rehab.    Social Worker assessment / plan:  CSW informed patient was readmitted to the hospital. Patient's daughter, Manuela Schwartz, had contacted this CSW last week to discuss the possibility of wanting to move her dad to another skilled nursing facility as they were not pleased with his care at [facility]. Patient has now been readmitted to the hospital and the family is wishing to seek placement at another facility. CSW met with patient, patient's daughter, son, and wife this afternoon. New bedsearch initiated.   Employment status:  Retired Nurse, adult PT Recommendations:  Not assessed at this time Information / Referral to community resources:  [place]  Patient/Family's Response to care:  Patient and family very appreciative for CSW assistance.    Patient/Family's Understanding of and Emotional Response to Diagnosis, Current Treatment, and Prognosis:  Patient and family verbalized understanding.  Emotional Assessment Appearance:  Appears older than stated age Attitude/Demeanor/Rapport:   (pleasant and cooperative) Affect (typically observed):  Accepting, Adaptable, Appropriate Orientation:    Alcohol / Substance use:  Not Applicable Psych involvement (Current and /or in the community):  No (Comment)  Discharge Needs  Concerns to be addressed:  Care Coordination Readmission within the last 30 days:  No Current discharge risk:  None Barriers to Discharge:  No Barriers Identified   Shela Leff, LCSW 02/03/2015, 1:35 PM

## 2015-02-04 ENCOUNTER — Encounter: Payer: Self-pay | Admitting: Surgery

## 2015-02-04 DIAGNOSIS — R627 Adult failure to thrive: Secondary | ICD-10-CM

## 2015-02-04 DIAGNOSIS — R410 Disorientation, unspecified: Secondary | ICD-10-CM

## 2015-02-04 DIAGNOSIS — Z9889 Other specified postprocedural states: Secondary | ICD-10-CM

## 2015-02-04 DIAGNOSIS — Z515 Encounter for palliative care: Secondary | ICD-10-CM

## 2015-02-04 DIAGNOSIS — I129 Hypertensive chronic kidney disease with stage 1 through stage 4 chronic kidney disease, or unspecified chronic kidney disease: Secondary | ICD-10-CM

## 2015-02-04 DIAGNOSIS — K1370 Unspecified lesions of oral mucosa: Secondary | ICD-10-CM

## 2015-02-04 DIAGNOSIS — K651 Peritoneal abscess: Principal | ICD-10-CM

## 2015-02-04 DIAGNOSIS — N1330 Unspecified hydronephrosis: Secondary | ICD-10-CM

## 2015-02-04 DIAGNOSIS — R5383 Other fatigue: Secondary | ICD-10-CM

## 2015-02-04 DIAGNOSIS — R630 Anorexia: Secondary | ICD-10-CM

## 2015-02-04 DIAGNOSIS — Z66 Do not resuscitate: Secondary | ICD-10-CM

## 2015-02-04 DIAGNOSIS — C20 Malignant neoplasm of rectum: Secondary | ICD-10-CM

## 2015-02-04 DIAGNOSIS — N183 Chronic kidney disease, stage 3 (moderate): Secondary | ICD-10-CM

## 2015-02-04 DIAGNOSIS — E43 Unspecified severe protein-calorie malnutrition: Secondary | ICD-10-CM

## 2015-02-04 DIAGNOSIS — N179 Acute kidney failure, unspecified: Secondary | ICD-10-CM

## 2015-02-04 LAB — BASIC METABOLIC PANEL
Anion gap: 5 (ref 5–15)
BUN: 35 mg/dL — ABNORMAL HIGH (ref 6–20)
CO2: 21 mmol/L — ABNORMAL LOW (ref 22–32)
CREATININE: 1.75 mg/dL — AB (ref 0.61–1.24)
Calcium: 7.3 mg/dL — ABNORMAL LOW (ref 8.9–10.3)
Chloride: 105 mmol/L (ref 101–111)
GFR calc non Af Amer: 34 mL/min — ABNORMAL LOW (ref >60)
GFR, EST AFRICAN AMERICAN: 40 mL/min — AB (ref >60)
Glucose, Bld: 111 mg/dL — ABNORMAL HIGH (ref 65–99)
POTASSIUM: 4.4 mmol/L (ref 3.5–5.1)
SODIUM: 131 mmol/L — AB (ref 135–145)

## 2015-02-04 LAB — GLUCOSE, CAPILLARY
GLUCOSE-CAPILLARY: 125 mg/dL — AB (ref 65–99)
GLUCOSE-CAPILLARY: 149 mg/dL — AB (ref 65–99)
GLUCOSE-CAPILLARY: 183 mg/dL — AB (ref 65–99)
GLUCOSE-CAPILLARY: 166 mg/dL — AB (ref 65–99)
Glucose-Capillary: 200 mg/dL — ABNORMAL HIGH (ref 65–99)

## 2015-02-04 LAB — TYPE AND SCREEN
ABO/RH(D): A POS
ANTIBODY SCREEN: NEGATIVE
UNIT DIVISION: 0

## 2015-02-04 LAB — PSA: PSA: 0.02 ng/mL (ref 0.00–4.00)

## 2015-02-04 MED ORDER — MUPIROCIN 2 % EX OINT
1.0000 "application " | TOPICAL_OINTMENT | Freq: Two times a day (BID) | CUTANEOUS | Status: DC
  Administered 2015-02-04 – 2015-02-06 (×6): 1 via NASAL
  Filled 2015-02-04: qty 22

## 2015-02-04 MED ORDER — INSULIN ASPART 100 UNIT/ML ~~LOC~~ SOLN
0.0000 [IU] | Freq: Three times a day (TID) | SUBCUTANEOUS | Status: DC
  Administered 2015-02-04: 2 [IU] via SUBCUTANEOUS
  Administered 2015-02-05 – 2015-02-06 (×3): 1 [IU] via SUBCUTANEOUS
  Filled 2015-02-04: qty 2
  Filled 2015-02-04 (×2): qty 1

## 2015-02-04 MED ORDER — CHLORHEXIDINE GLUCONATE CLOTH 2 % EX PADS
6.0000 | MEDICATED_PAD | Freq: Every day | CUTANEOUS | Status: DC
  Administered 2015-02-05 – 2015-02-07 (×3): 6 via TOPICAL

## 2015-02-04 MED ORDER — INSULIN ASPART 100 UNIT/ML ~~LOC~~ SOLN
0.0000 [IU] | Freq: Every day | SUBCUTANEOUS | Status: DC
  Filled 2015-02-04: qty 1

## 2015-02-04 NOTE — Progress Notes (Signed)
[facility] NAME: Darryl Hooper    MR#:  [id number]  DATE OF BIRTH:  1929-12-03  SUBJECTIVE:  CHIEF COMPLAINT:   Chief Complaint  Patient presents with  . Failure To Thrive  . URI  Looks ok, no new issues. REVIEW OF SYSTEMS:  Review of Systems  Unable to perform ROS: acuity of condition   DRUG ALLERGIES:  No Known Allergies VITALS:  Blood pressure 99/45, pulse 64, temperature 97.5 F (36.4 C), temperature source Oral, resp. rate 16, height 6\' 2"  (1.88 m), weight 97.523 kg (215 lb), SpO2 96 %. PHYSICAL EXAMINATION:  Physical Exam  Constitutional: He is oriented to person, place, and time. He appears lethargic, malnourished and dehydrated. He appears unhealthy. He appears cachectic. He appears toxic. He has a sickly appearance.  HENT:  Head: Normocephalic and atraumatic.  Eyes: Conjunctivae and EOM are normal. Pupils are equal, round, and reactive to light.  Neck: Normal range of motion. Neck supple. No tracheal deviation present. No thyromegaly present.  Cardiovascular: Normal rate, regular rhythm and normal heart sounds.   Pulmonary/Chest: Effort normal and breath sounds normal. No respiratory distress. He has no wheezes. He exhibits no tenderness.  Abdominal: Soft. Bowel sounds are normal. He exhibits no distension. There is no tenderness.  Musculoskeletal: Normal range of motion.  Neurological: He is oriented to person, place, and time. He appears lethargic. No cranial nerve deficit.  Skin: Skin is warm and dry. No rash noted.  Sacral decubitus - wound vac in place  Psychiatric: Mood, affect and judgment normal.   LABORATORY PANEL:   CBC  Recent Labs Lab 02/03/15 0414 02/03/15 1935  WBC 12.7*  --   HGB 6.4* 7.7*  HCT 20.2* 23.6*  PLT 307  --    ------------------------------------------------------------------------------------------------------------------ Chemistries   Recent Labs Lab 02/01/15 1536   02/04/15 0558  NA 131*  < > 131*  K 5.4*  < > 4.4  CL 103  < > 105  CO2 21*  < > 21*  GLUCOSE 132*  < > 111*  BUN 65*  < > 35*  CREATININE 2.02*  < > 1.75*  CALCIUM 8.5*  < > 7.3*  AST 95*  --   --   ALT 88*  --   --   ALKPHOS 78  --   --   BILITOT 0.4  --   --   < > = values in this interval not displayed. RADIOLOGY:  No results found. ASSESSMENT AND PLAN:   * Hypoglycemia with a history of Diabetes mellitus, type 2 - follow hypoglycemia protocols while in the hospital. hold home diabetes medications for now, sliding scale insulin every 6 hours with appropriate fingerstick glucose checks while patient is nothing by mouth, this will need to be changed to before meals at bedtime once the patient is eating a carb modified diet again.  consult diabetic nurse coordinator  *Hypotension with a history of Benign essential HTN - blood pressure is on the low side, hold antihypertensives for now  * Anemia: Likely acute on chronic blood loss, s/p 1 unit of packed red blood cell transfusion. hemoglobin is down to 6.4 -> 7.7  * CKD stage III: close to baseline. monitor  * Arthritis - Appropriate pain management for this in conjunction with pain management of his above principal problem  * Abscess of male pelvis - irrigation and debridement sacral wound abscess X 2 and placement of wound VAC, management per surgery.  Wound VAC working well   * Right hydronephrosis and hydroureter: Urology consultation  Appreciate palliative care input.  Likely another meeting today  All the records are reviewed and case discussed with Care Management/Social Worker. Management plans discussed with the patient, family and they are in agreement.  CODE STATUS: Full code  TOTAL TIME TAKING CARE OF THIS PATIENT: 25 minutes.   More than 50% of the time was spent in counseling/coordination of care: Augustina Mood, Yun Gutierrez M.D on 02/04/2015 at 11:45 AM  Between 7am to 6pm - Pager - [phone]  After 6pm go to  [email or web address] - password [email or web address] [facility]  Office  [phone]  CC:  Primary care physician; Dicky Doe, MD

## 2015-02-04 NOTE — Clinical Documentation Improvement (Signed)
Internal Medicine  Can the diagnosis of Malnutrition be further specified?   Document Severity - Severe(third degree), Moderate (second degree), Mild (first degree)  Other condition  Unable to clinically determine    Supporting Information: :  02/02/15 Note: " He appears lethargic, malnourished, dehydrated. He appears cachectic." BMI= 27.9  Please exercise your independent, professional judgment when responding. A specific answer is not anticipated or expected.   Thank You, Rolm Gala, RN, BSN [place] HIM/Clinical Documentation Specialist [email or web address] [phone]

## 2015-02-04 NOTE — Anesthesia Postprocedure Evaluation (Signed)
  Anesthesia Post-op Note  Patient: Darryl Hooper.  Procedure(s) Performed: Procedure(s): irrigation and debridement sacral wound abscess X 2 (N/A)  Anesthesia type:General  Patient location: PACU  Post pain: Pain level controlled  Post assessment: Post-op Vital signs reviewed, Patient's Cardiovascular Status Stable, Respiratory Function Stable, Patent Airway and No signs of Nausea or vomiting  Post vital signs: Reviewed and stable  Last Vitals:  Filed Vitals:   02/04/15 0813  BP: 99/45  Pulse: 64  Temp: 36.4 C  Resp:     Level of consciousness: awake, alert  and patient cooperative  Complications: No apparent anesthesia complications

## 2015-02-04 NOTE — Progress Notes (Signed)
Inpatient Diabetes Program Recommendations  AACE/ADA: New Consensus Statement on Inpatient Glycemic Control (2013)  Target Ranges:  Prepandial:   less than 140 mg/dL      [facility] postprandial:   less than 180 mg/dL (1-2 hours)      Critically ill patients:  140 - 180 mg/dL   Reason for Visit: consult Dr. Manuella Ghazi  Diabetes history: Type 2 Outpatient Diabetes medications: Amaryl Current orders for Inpatient glycemic control: Novolog 0-9 units q6h  Patient is no longer NPO and is eating well- please change Novolog correction insulin to Novolog 0-9 units tid with meals and 0-5 units qhs.   Gentry Fitz, RN, BA, MHA, CDE Diabetes Coordinator Inpatient Diabetes Program  [phone] (Team Pager) [phone] ([place]) 02/04/2015 12:36 PM

## 2015-02-04 NOTE — Clinical Documentation Improvement (Signed)
Internal Medicine  Abnormal Lab/Test Results:  02/01/15: K= 5.4  Possible Clinical Conditions associated with below indicators  Hyperkalemia  Other Condition  Cannot Clinically Determine   Please exercise your independent, professional judgment when responding. A specific answer is not anticipated or expected.   Thank You,  Rolm Gala, RN, BSN [place] HIM/Clinical Documentation Specialist [email or web address] [phone]

## 2015-02-04 NOTE — Clinical Documentation Improvement (Signed)
Internal Medicine  Abnormal Lab/Test Results:  02/01/15 Na= 131  Possible Clinical Conditions associated with below indicators  Hyponatremia  Other Condition  Cannot Clinically Determine  Treatment Provided: IVF's   Please exercise your independent, professional judgment when responding. A specific answer is not anticipated or expected.   Thank You,  Rolm Gala, RN, BSN [place] HIM/Clinical Documentation Specialist [email or web address] [phone]

## 2015-02-04 NOTE — Progress Notes (Signed)
Patient ID: Darryl Damaso., male   DOB: 1929/12/26, 81 y.o.   MRN: [id number]   Postop day 1 status post incision and drainage of sacral decubitus ulcer with wound VAC application and drainage of presacral abscess. Wound VAC seems to be holding suction. Patient is without complaints.  Appreciate palliative care input from yesterday. Further discussions will be taken today or tomorrow.  Filed Vitals:   02/03/15 1849 02/04/15 0028 02/04/15 0338 02/04/15 0813  BP: 100/47 96/36 110/50 99/45  Pulse: 80 73 69 64  Temp: 99 F (37.2 C) 98.4 F (36.9 C) 98 F (36.7 C) 97.5 F (36.4 C)  TempSrc: Oral Oral Oral Oral  Resp: 16 16    Height:      Weight:      SpO2: 96% 96% 95% 96%    On exam his abdomen is soft ostomy is functioning.  Lower midline wound is granulating dressing was applied. VAC is holding suction.   CBC Latest Ref Rng 02/03/2015 02/03/2015 02/02/2015  WBC 3.8 - 10.6 K/uL - 12.7(H) 17.3(H)  Hemoglobin 13.0 - 18.0 g/dL 7.7(L) 6.4(L) 7.7(L)  Hematocrit 40.0 - 52.0 % 23.6(L) 20.2(L) 24.7(L)  Platelets 150 - 440 K/uL - 307 366    BMP Latest Ref Rng 02/04/2015 02/03/2015 02/02/2015  Glucose 65 - 99 mg/dL 111(H) 61(L) 81  BUN 6 - 20 mg/dL 35(H) 44(H) 53(H)  Creatinine 0.61 - 1.24 mg/dL 1.75(H) 1.77(H) 1.80(H)  Sodium 135 - 145 mmol/L 131(L) 131(L) 133(L)  Potassium 3.5 - 5.1 mmol/L 4.4 4.6 4.6  Chloride 101 - 111 mmol/L 105 105 107  CO2 22 - 32 mmol/L 21(L) 22 20(L)  Calcium 8.9 - 10.3 mg/dL 7.3(L) 7.2(L) 7.8(L)    Impression:   Sacral pressure decubitus ulcer plan for wound VAC change under anesthesia and possible further debridement tomorrow Right hydronephrosis and hydroureter. I will contact urology regarding possible stent placement tomorrow.  Regarding palliative care input further discussions will be undertaken with the family and to their care.  He did screen MRCP positive and Bactroban will be initiated. Continue intravenous antibiotics for now. Family was updated.

## 2015-02-04 NOTE — Progress Notes (Signed)
Urology Consult Follow Up  Subjective: Patient resting comfortably.  He is lethargic.  Family is tearful at bedside.  His Cr is improving.    Anti-infective's: Anti-infectives    Start     Dose/Rate Route Frequency Ordered Stop   02/01/15 2200  ertapenem (INVANZ) 1 g in sodium chloride 0.9 % 50 mL IVPB     1 g 100 mL/hr over 30 Minutes Intravenous Every 24 hours 02/01/15 1958     02/01/15 1845  vancomycin (VANCOCIN) IVPB 1000 mg/200 mL premix     1,000 mg 200 mL/hr over 60 Minutes Intravenous  Once 02/01/15 1837 02/01/15 2023   02/01/15 1845  piperacillin-tazobactam (ZOSYN) IVPB 3.375 g     3.375 g 12.5 mL/hr over 240 Minutes Intravenous  Once 02/01/15 1837 02/01/15 1921      Current Facility-Administered Medications  Medication Dose Route Frequency Provider Last Rate Last Dose  . acetaminophen (TYLENOL) tablet 650 mg  650 mg Oral Q6H PRN Dia Crawford III, MD   650 mg at 02/02/15 1847  . [START ON 02/05/2015] Chlorhexidine Gluconate Cloth 2 % PADS 6 each  6 each Topical Q0600 [name], MD      . dextrose 5 %-0.45 % sodium chloride infusion   Intravenous Continuous Marlyce Huge, MD 75 mL/hr at 02/04/15 0548    . enoxaparin (LOVENOX) injection 40 mg  40 mg Subcutaneous Q24H Dia Crawford III, MD   40 mg at 02/03/15 2115  . ertapenem (INVANZ) 1 g in sodium chloride 0.9 % 50 mL IVPB  1 g Intravenous Q24H Dia Crawford III, MD   1 g at 02/03/15 2115  . famotidine (PEPCID) tablet 20 mg  20 mg Oral Daily [name], MD   20 mg at 02/04/15 0848  . fentaNYL (SUBLIMAZE) injection 25 mcg  25 mcg Intravenous Q5 min PRN Gunnar Bulla, MD      . ferrous sulfate tablet 325 mg  325 mg Oral TID WC Dia Crawford III, MD   325 mg at 02/04/15 0848  . gabapentin (NEURONTIN) capsule 100 mg  100 mg Oral QHS Dia Crawford III, MD   100 mg at 02/03/15 2115  . HYDROcodone-acetaminophen (NORCO/VICODIN) 5-325 MG per tablet 1 tablet  1 tablet Oral Q6H PRN Dia Crawford III, MD   1 tablet at 02/03/15 2115  . insulin aspart  (novoLOG) injection 0-9 Units  0-9 Units Subcutaneous Q6H Lance Coon, MD   1 Units at 02/04/15 1251  . morphine 2 MG/ML injection 2 mg  2 mg Intravenous Q2H PRN [name], MD   2 mg at 02/04/15 0338  . mupirocin ointment (BACTROBAN) 2 % 1 application  1 application Nasal BID [name], MD   1 application at 25/85/27 1253  . ondansetron (ZOFRAN-ODT) disintegrating tablet 4 mg  4 mg Oral Q6H PRN Dia Crawford III, MD       Or  . ondansetron [facility]) injection 4 mg  4 mg Intravenous Q6H PRN Dia Crawford III, MD      . ondansetron [facility]) injection 4 mg  4 mg Intravenous Once PRN Gunnar Bulla, MD         Objective: Vital signs in last 24 hours: Temp:  [97.5 F (36.4 C)-99.3 F (37.4 C)] 97.5 F (36.4 C) (09/06 0813) Pulse Rate:  [64-89] 64 (09/06 0813) Resp:  [16-18] 16 (09/06 0028) BP: (93-110)/(36-50) 99/45 mmHg (09/06 0813) SpO2:  [95 %-98 %] 96 % (09/06 0813)  Intake/Output from previous day: 09/05 0701 - 09/06 0700 In: 2045 [  P.O.:120; I.V.:1548; Blood:283; IV Piggyback:94] Out: 9528 [Urine:1525; Drains:150] Intake/Output this shift: Total I/O In: 771.3 [P.O.:480; I.V.:291.3] Out: 575 [Urine:575]   Physical Exam Constitutional: Lethargic, No acute distress. HEENT: [place] AT, dry mucus membranes. Trachea midline, no masses. Cardiovascular: No clubbing, cyanosis, or edema. Respiratory: Normal respiratory effort, no increased work of breathing. GU: Foley draining clear yellow urine.  Lab Results:   Recent Labs  02/02/15 0714 02/03/15 0414 02/03/15 1935  WBC 17.3* 12.7*  --   HGB 7.7* 6.4* 7.7*  HCT 24.7* 20.2* 23.6*  PLT 366 307  --    BMET  Recent Labs  02/03/15 0414 02/04/15 0558  NA 131* 131*  K 4.6 4.4  CL 105 105  CO2 22 21*  GLUCOSE 61* 111*  BUN 44* 35*  CREATININE 1.77* 1.75*  CALCIUM 7.2* 7.3*   PT/INR No results for input(s): LABPROT, INR in the last 72 hours. ABG No results for input(s): PHART, HCO3 in the last 72 hours.  Invalid input(s):  PCO2, PO2  Studies/Results: No results found.   Assessment & Plan: s/p Procedure(s): irrigation and debridement sacral wound abscess X 2  1 - Right Mild Hydronephrosis / Rt Distal Ureteral Abnormality - new mild right hydro by CT 01/2015 on eval sacral abscess. Mild hydro to markedly distended bladder w/o obvious mass / stone / foreign body. This does appear new since prior study 2011. He did have attempted Rt ureteral stent by Dr. Erlene Quan 11/2014 at time of initial colon cancer surgery at which time unable to place stent, felt possible distal stricture.  Would not recommend an attempt of ureteral stent placement at this time due to patient's status and improving creatinine.  If his prognosis should improve or his GFR is worsening, stent placement would be readdressed at that time.   2 - Chronic Renal Insufficiency - Cr 1.1 range prior to 11/2013. Mild right hydro as per above by CT 01/2015. He is diabetic and known PAD as well as some Lt>Rt renal atrophy.  Creatinine is improving, down to 1.75 today.    3 - Prostate Cancer - s/p brachytherapy For unknown grade / stage disease. His prostate is in situ with brachytherapy seeds by CT 01/2015. No recent PSA's.   PMH sig for rectal cancer, DM2, chronic buttocks wound.   CC:     LOS: 3 days    [facility] 02/04/2015

## 2015-02-04 NOTE — Progress Notes (Signed)
Palliative Medicine Inpatient Consult Follow Up Note   Name: Darryl Hooper. Date: 02/04/2015 MRN: [id number]  DOB: [birth year]  Referring Physician: [name], MD  Palliative Care consult requested for this 81 y.o. male for goals of medical therapy in patient with a sacral abscess and other problems as listed below under 'IMPRESSION'.    --------------------------------------------------------------------------------------------------------------------------- TODAY'S CONVERSATIONS, EVENTS, AND PLANS:  1.  Patient agreed to DNR status today.  He was very tired and I could only address this one topic today.  He said, "If it has gone that far, then there is no point trying to do anything because I would be dead."  He agreed to DNR and I informed his daughter by phone of his decision.  I also placed a [place] DNR form in the paper chart.  2.  I missed seeing the family in person but called the patient's daughter. We had a lengthy and detailed conversation.  I reviewed some of his medical conditions and discussed the PET scan findings, his fibrosis from his prior prostate cancer treatment, and his overall outlook. I let her know that 'it doesn't look good' in terms of him being able to recover, given his malnutrition, and the fact that he has a reduced chance of cure given that he cannot have radiation or chemo.  I mentioned the fibrosis found during his colon surgery as a reason why his pelvic organs do not get good blood flow causing things to be a 'set up for infection and not for healing'.  Daughter let me know that her mother said today, after seeing her husband looking as bad as he looks today, that things are going so badly for him that [place] might be what ends up happening. This indicates that pts wife is aware of how badly he is doing.  He mowed the lawn before his colon surgery and they had no idea the outcome could be the opposite of what they were hoping would take place.    3.  Given  that pt's daughter (and even his wife) are starting to become aware that he may not recover, I will plan a conference with pt and family on Thursday.  I will not be here tomorrow. Pt is likely to have a debridement done tomorrow and that should proceed.  No big changes in his current care are planned in the interim.    4.  Daughter did let me know that they do NOT have resources for private pay for a long term care stay (with Hospice) in a facility.  She asked if he would be eligible for a return under"skilled care in a skilled facility" and since he has [facility], I could not answer that and referred her to the Education officer, museum for answers about that matter.  It appears that if he can't go back to a SNF under skilled days, options are home with Hospice or Hospice Home.  And if patient himself declines Hospice, home with Home Health.    5. I will want to talk with his other doctors to be certain that all are in agreement with the proposal for Hospice.   Marland Kitchen  ------------------------------------------------------------------------------------------------------------------------  REVIEW OF SYSTEMS:  Patient is not able to provide ROS in detail due to illness, weakness, weak voice, and limitation in communication to only the most essential conversation  CODE STATUS: DNR AS OF NOW --per my discussion with pt today   PAST MEDICAL HISTORY: Past Medical History  Diagnosis Date  .  Hypertension   . Peripheral vascular disease   . Hyperlipidemia   . Diarrhea   . Diabetes mellitus without complication   . BPH (benign prostatic hyperplasia)   . Prostate cancer   . Arthritis   . Knee pain, bilateral   . Rectal cancer   . Chronic kidney disease     incontience  . IBS (irritable bowel syndrome)   . Neuropathy due to secondary diabetes mellitus   . Hx of pulmonary embolus     2011--treated with Coumadin    PAST SURGICAL HISTORY:  Past Surgical History  Procedure Laterality Date  .  Prostatectomy    . Joint replacement      left knee  . Colonoscopy N/A 10/23/2014    Procedure: COLONOSCOPY;  Surgeon: Lucilla Lame, MD;  Location: [facility];  Service: Gastroenterology;  Laterality: N/A;  . Knee arthroscopy w/ meniscal repair Left 2012  . Eus N/A 11/14/2014    Procedure: LOWER ENDOSCOPIC ULTRASOUND (EUS);  Surgeon: Holly Bodily, MD;  Location: [facility]COPY;  Service: Endoscopy;  Laterality: N/A;  . Eye surgery Bilateral     cataract extraction  . Tonsillectomy    . Abdominal perineal bowel resection N/A 12/12/2014    Procedure: ABDOMINAL PERINEAL RESECTION;  Surgeon: [name], MD;  Location: [facility];  Service: General;  Laterality: N/A;  . Cystoscopy with stent placement Bilateral 12/12/2014    Procedure: cystoscopy, bilateral retrogrades, left ureteral stent placement, attempted right stent placement.;  Surgeon: Hollice Espy, MD;  Location: [facility];  Service: Urology;  Laterality: Bilateral;  . Irrigation and debridement abscess N/A 12/23/2014    Procedure: IRRIGATION AND DEBRIDEMENT ABSCESS/ PERINEAL WOUND DEBRIDMENT;  Surgeon: Marlyce Huge, MD;  Location: [facility];  Service: General;  Laterality: N/A;  . Irrigation and debridement abscess N/A 12/25/2014    Procedure: IRRIGATION AND DEBRIDEMENT ABSCESS;  Surgeon: Marlyce Huge, MD;  Location: [facility];  Service: General;  Laterality: N/A;  . Application of wound vac N/A 12/25/2014    Procedure: APPLICATION OF WOUND VAC;  Surgeon: Marlyce Huge, MD;  Location: [facility];  Service: General;  Laterality: N/A;  . Minor application of wound vac N/A 12/30/2014    Procedure: MINOR APPLICATION OF WOUND VAC;  Surgeon: [name], MD;  Location: [facility];  Service: General;  Laterality: N/A;  . Irrigation and debridement buttocks N/A 02/03/2015    Procedure: irrigation and debridement sacral wound abscess X 2;  Surgeon: [name], MD;  Location: [facility];  Service: General;  Laterality: N/A;     Vital Signs: BP 99/45 mmHg  Pulse 64  Temp(Src) 97.5 F (36.4 C) (Oral)  Resp 16  Ht _0  (1.88 m)  Wt 97.523 kg (215 lb)  BMI 27.59 kg/m2  SpO2 96% Filed Weights   02/01/15 1505  Weight: 97.523 kg (215 lb)    Estimated body mass index is 27.59 kg/(m^2) as calculated from the following:   Height as of this encounter: _1  (1.88 m).   Weight as of this encounter: 97.523 kg (215 lb).  PHYSICAL EXAM: Profoundly weak Eyes closed but he wakens and is oriented enough for me to talk about code status  EOMI OP clear Speech is a bit dysarthric but speech content is appropriate Neck w/o JVD or TM Heart rrr no murmur heard Abd soft and NT Ext pale, no mottling   LABS: CBC:    Component Value Date/Time   WBC 12.7* 02/03/2015 0414   HGB 7.7* 02/03/2015 1935   HCT  23.6* 02/03/2015 1935   PLT 307 02/03/2015 0414   MCV 84.6 02/03/2015 0414   NEUTROABS 21.6* 02/01/2015 1536   LYMPHSABS 0.7* 02/01/2015 1536   MONOABS 1.0 02/01/2015 1536   EOSABS 0.1 02/01/2015 1536   BASOSABS 0.0 02/01/2015 1536   Comprehensive Metabolic Panel:    Component Value Date/Time   NA 131* 02/04/2015 0558   K 4.4 02/04/2015 0558   CL 105 02/04/2015 0558   CO2 21* 02/04/2015 0558   BUN 35* 02/04/2015 0558   CREATININE 1.75* 02/04/2015 0558   GLUCOSE 111* 02/04/2015 0558   CALCIUM 7.3* 02/04/2015 0558   AST 95* 02/01/2015 1536   ALT 88* 02/01/2015 1536   ALKPHOS 78 02/01/2015 1536   BILITOT 0.4 02/01/2015 1536   PROT 6.1* 02/01/2015 1536   ALBUMIN 2.0* 02/01/2015 1536    IMPRESSION: 1. Pre-sacral Abscess presenting with fever and chills and leukocytosis ---Now debrided X2 on 02/03/15 with wound vac placed 2. Sacral Ulcer --development post perineal wound resection with perineal wound necrosis 3. Severe malnutrition associated with anorexia of cancer 4. Elevated Liver Function tests 5. Severe anemia with weakness 6. 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 Man, urology.  ---right ureteral stent could not be placed in July when he had his colon surgery (thought possibly due to distal stricture) 7. Acute renal failure on CKD stage III ---Baseline Cr = 1.1 and Cr was in 2.0 -2.5 range ---Cr now 1.75 ---Right hydronephrosis is likely contributing ---He is diabetic and may have advancing medical renal disease  ---Left kidney > right kidney atrophy noted also 8. HTN ---with borderline low BP this adm 9. DM2 with Diabetic Peripheral Neuropathy 10. DJD with chronic joint pain 11. MRSA screen positive 12. Recent perineal resection for stage 11a rectal cancer  ---with complications due to fibrosis/ scarring from prior brachiotherapy for prostate cancer ---with PET scan positive for oral lesion of undetermined etiology (met vs new primary) 13. History of Prostate Cancer treated with brachiotherapy  14. Weakness 15. Anorexia and Failure to Thrive in an adult  25.  Hyperkalemia --resolved 17.  Hyponatremia  See top of note for PLAN   More than 50% of the visit was spent in counseling/coordination of care: YES  Time Spent: 70  min

## 2015-02-05 ENCOUNTER — Telehealth: Payer: Self-pay

## 2015-02-05 ENCOUNTER — Encounter
Admission: EM | Disposition: A | Discharge status: Skilled Nursing Facility | Payer: Self-pay | Source: Home / Self Care | Attending: Surgery

## 2015-02-05 ENCOUNTER — Encounter: Payer: Self-pay | Admitting: Anesthesiology

## 2015-02-05 ENCOUNTER — Inpatient Hospital Stay: Payer: Medicare Other | Admitting: Anesthesiology

## 2015-02-05 ENCOUNTER — Ambulatory Visit: Payer: Medicare Other | Admitting: Surgery

## 2015-02-05 DIAGNOSIS — T814XXD Infection following a procedure, subsequent encounter: Secondary | ICD-10-CM

## 2015-02-05 HISTORY — PX: APPLICATION OF WOUND VAC: SHX5189

## 2015-02-05 LAB — CBC
HEMATOCRIT: 22.6 % — AB (ref 40.0–52.0)
HEMOGLOBIN: 7.4 g/dL — AB (ref 13.0–18.0)
MCH: 27.9 pg (ref 26.0–34.0)
MCHC: 32.8 g/dL (ref 32.0–36.0)
MCV: 85.1 fL (ref 80.0–100.0)
Platelets: 331 10*3/uL (ref 150–440)
RBC: 2.66 MIL/uL — ABNORMAL LOW (ref 4.40–5.90)
RDW: 15.5 % — AB (ref 11.5–14.5)
WBC: 11.8 10*3/uL — AB (ref 3.8–10.6)

## 2015-02-05 LAB — BASIC METABOLIC PANEL
ANION GAP: 6 (ref 5–15)
BUN: 29 mg/dL — ABNORMAL HIGH (ref 6–20)
CALCIUM: 7.3 mg/dL — AB (ref 8.9–10.3)
CHLORIDE: 104 mmol/L (ref 101–111)
CO2: 20 mmol/L — AB (ref 22–32)
Creatinine, Ser: 1.61 mg/dL — ABNORMAL HIGH (ref 0.61–1.24)
GFR calc Af Amer: 44 mL/min — ABNORMAL LOW (ref >60)
GFR calc non Af Amer: 38 mL/min — ABNORMAL LOW (ref >60)
GLUCOSE: 133 mg/dL — AB (ref 65–99)
Potassium: 4.8 mmol/L (ref 3.5–5.1)
Sodium: 130 mmol/L — ABNORMAL LOW (ref 135–145)

## 2015-02-05 LAB — GLUCOSE, CAPILLARY
GLUCOSE-CAPILLARY: 107 mg/dL — AB (ref 65–99)
GLUCOSE-CAPILLARY: 68 mg/dL (ref 65–99)
GLUCOSE-CAPILLARY: 124 mg/dL — AB (ref 65–99)
GLUCOSE-CAPILLARY: 136 mg/dL — AB (ref 65–99)
Glucose-Capillary: 102 mg/dL — ABNORMAL HIGH (ref 65–99)
Glucose-Capillary: 117 mg/dL — ABNORMAL HIGH (ref 65–99)

## 2015-02-05 SURGERY — APPLICATION, WOUND VAC
Anesthesia: General | Wound class: Dirty or Infected

## 2015-02-05 MED ORDER — PROPOFOL 10 MG/ML IV BOLUS
INTRAVENOUS | Status: DC | PRN
  Administered 2015-02-05: 50 mg via INTRAVENOUS

## 2015-02-05 MED ORDER — SUCCINYLCHOLINE CHLORIDE 20 MG/ML IJ SOLN
INTRAMUSCULAR | Status: DC | PRN
  Administered 2015-02-05: 80 mg via INTRAVENOUS

## 2015-02-05 MED ORDER — LACTATED RINGERS IV SOLN
INTRAVENOUS | Status: DC | PRN
  Administered 2015-02-05: 14:00:00 via INTRAVENOUS

## 2015-02-05 MED ORDER — LIDOCAINE HCL (CARDIAC) 20 MG/ML IV SOLN
INTRAVENOUS | Status: DC | PRN
  Administered 2015-02-05: 60 mg via INTRAVENOUS

## 2015-02-05 SURGICAL SUPPLY — 15 items
CANISTER SUCT 1200ML W/VALVE (MISCELLANEOUS) ×3 IMPLANT
DRAPE LAPAROTOMY 100X77 ABD (DRAPES) ×3 IMPLANT
DRAPE UTILITY 15X26 TOWEL STRL (DRAPES) ×6 IMPLANT
DRSG VAC ATS MED SENSATRAC (GAUZE/BANDAGES/DRESSINGS) ×3 IMPLANT
GAUZE SPONGE 4X4 12PLY STRL (GAUZE/BANDAGES/DRESSINGS) IMPLANT
GLOVE BIO SURGEON STRL SZ7.5 (GLOVE) ×3 IMPLANT
GOWN STRL REUS W/ TWL LRG LVL3 (GOWN DISPOSABLE) ×1 IMPLANT
GOWN STRL REUS W/ TWL XL LVL3 (GOWN DISPOSABLE) ×1 IMPLANT
GOWN STRL REUS W/TWL LRG LVL3 (GOWN DISPOSABLE) ×2
GOWN STRL REUS W/TWL XL LVL3 (GOWN DISPOSABLE) ×2
KIT RM TURNOVER STRD PROC AR (KITS) ×3 IMPLANT
NS IRRIG 1000ML POUR BTL (IV SOLUTION) ×3 IMPLANT
PACK BASIN MINOR ARMC (MISCELLANEOUS) ×3 IMPLANT
SPONGE LAP 18X18 5 PK (GAUZE/BANDAGES/DRESSINGS) ×3 IMPLANT
WND VAC CANISTER 500ML (MISCELLANEOUS) ×3 IMPLANT

## 2015-02-05 NOTE — Care Management Important Message (Signed)
Important Message  Patient Details  Name: Darryl Hooper. MRN: [id number] Date of Birth: 1929-07-19   Medicare Important Message Given:  Yes-third notification given    Alvie Heidelberg, RN 02/05/2015, 10:17 AM

## 2015-02-05 NOTE — Progress Notes (Signed)
Patient ID: Darryl Hooper., male   DOB: Oct 14, 1929, 81 y.o.   MRN: [id number]   Postop day #2. Plan is for wound VAC change and further debridement and removal of Penrose drain today under anesthesia. I appreciate Palliative care medicine as well as internal medicine urology assistance.  Continue distal planning.

## 2015-02-05 NOTE — Anesthesia Postprocedure Evaluation (Signed)
  Anesthesia Post-op Note  Patient: Darryl Hooper.  Procedure(s) Performed: Procedure(s): APPLICATION OF WOUND VAC/ SACRUM WOUND VAC CHANGE (N/A)  Anesthesia type:General ETT  Patient location: PACU  Post pain: Pain level controlled  Post assessment: Post-op Vital signs reviewed, Patient's Cardiovascular Status Stable, Respiratory Function Stable, Patent Airway and No signs of Nausea or vomiting  Post vital signs: Reviewed and stable  Last Vitals:  Filed Vitals:   02/05/15 1749  BP:   Pulse:   Temp: 36.4 C  Resp:     Level of consciousness: awake, alert  and patient cooperative  Complications: No apparent anesthesia complications

## 2015-02-05 NOTE — Progress Notes (Signed)
   Subjective:    Patient ID: Darryl Hooper., male    DOB: 1929/07/31, 81 y.o.   MRN: [id number]  URI  This is a chronic problem. The current episode started in the past 7 days. The problem has been gradually improving. Pertinent negatives include no abdominal pain, chest pain, congestion, coughing, diarrhea, dysuria, plugged ear sensation, rash or swollen glands. The treatment provided moderate relief.  outlet obstruction foley in place    Review of Systems  HENT: Negative for congestion.   Respiratory: Negative for cough.   Cardiovascular: Negative for chest pain.  Gastrointestinal: Negative for abdominal pain and diarrhea.  Genitourinary: Negative for dysuria.  Skin: Negative for rash.       Objective:   Physical ExamAbdomen soft, foley output improving        Assessment & Plan:continue foley drainage  Creatinine improving and the stent is not necessary at this time

## 2015-02-05 NOTE — Progress Notes (Signed)
Pt is confused at times. This morning he stated that he was here, "To have the wounds on his bottom looked at." The he began asking for his mother. Pt has been resting in bed, and at time the pt will moan. Pt denies any pain. Plan is to take pt to OR for debridement.

## 2015-02-05 NOTE — Progress Notes (Signed)
PT Cancellation Note  Patient Details Name: Darryl Hooper. MRN: [id number] DOB: 1929/08/06   Cancelled Treatment:    Reason Eval/Treat Not Completed: Medical issues which prohibited therapy (Per chart review, patient noted with plans for further surgical intervention (debridement) to sacral ulcer this date (under general anesthesia).  Per protocol, patient will require new orders to initiate PT evaluation post-procedure.  Will hold PT eval at this time until surgical plan complete and firm goals of care established (scheduled family meeting for next date to discuss possible Hospice Home transfer).)   Erin Obando H. Owens Shark, PT, DPT, NCS 02/05/2015, 9:27 AM [phone]

## 2015-02-05 NOTE — Progress Notes (Signed)
Patient ID: Darryl Hooper., male   DOB: [birth year], 81 y.o.   MRN: [id number]   Discussed operative findings and plan with the wife and son. Await palliative care further input. The patient has not been made a DO NOT RESUSCITATE. The wife did bring up hospice care at the hospice home upon discharge. I do  not think that this would be a bad idea at this time.

## 2015-02-05 NOTE — Anesthesia Preprocedure Evaluation (Signed)
Anesthesia Evaluation  Patient identified by MRN, date of birth, ID band Patient awake    Reviewed: Allergy & Precautions, H&P , NPO status , Patient's Chart, lab work & pertinent test results, reviewed documented beta blocker date and time   History of Anesthesia Complications Negative for: history of anesthetic complications  Airway Mallampati: III  TM Distance: >3 FB Neck ROM: limited    Dental  (+) Chipped, Poor Dentition   Pulmonary neg pulmonary ROS,    Pulmonary exam normal breath sounds clear to auscultation       Cardiovascular Exercise Tolerance: Poor hypertension, Pt. on medications + Peripheral Vascular Disease  Normal cardiovascular exam Rhythm:regular Rate:Normal     Neuro/Psych  Neuromuscular disease negative psych ROS   GI/Hepatic Neg liver ROS, GERD  Controlled and Medicated,  Endo/Other  diabetes, Type 2  Renal/GU Renal InsufficiencyRenal disease  negative genitourinary   Musculoskeletal  (+) Arthritis ,   Abdominal   Peds  Hematology negative hematology ROS (+)   Anesthesia Other Findings Past Medical History:   Hypertension                                                 Peripheral vascular disease                                  Hyperlipidemia                                               Diarrhea                                                     Diabetes mellitus without complication                       BPH (benign prostatic hyperplasia)                           Prostate cancer                                              Arthritis                                                    Knee pain, bilateral                                         Rectal cancer  Chronic kidney disease                                         Comment:incontience   IBS (irritable bowel syndrome)                               Neuropathy due to secondary diabetes  mellitus                Hx of pulmonary embolus                                        Comment:2011--treated with Coumadin   Reproductive/Obstetrics negative OB ROS                             Anesthesia Physical  Anesthesia Plan  ASA: IV  Anesthesia Plan: General ETT   Post-op Pain Management:    Induction: Intravenous  Airway Management Planned: Oral ETT  Additional Equipment:   Intra-op Plan:   Post-operative Plan:   Informed Consent: I have reviewed the patients History and Physical, chart, labs and discussed the procedure including the risks, benefits and alternatives for the proposed anesthesia with the patient or authorized representative who has indicated his/her understanding and acceptance.   Dental Advisory Given  Plan Discussed with: CRNA  Anesthesia Plan Comments: (Wife consented over the phone.  DNR suspended for procedure per wife.  Wife informed that patient is higher risk for complications from anesthetic due to his medical comorbidities.  Wife voiced understanding.)        Anesthesia Quick Evaluation

## 2015-02-05 NOTE — Progress Notes (Signed)
Pt has become increasingly confused since beginning of shift.  Pt is mumbling and incoherent and pulling at his foley and trying to throw legs over bed stating he has an appt.  i have redirected him several times tonight. He was A&O yesterday night when I had him.  Restless and confused tonight.

## 2015-02-05 NOTE — Transfer of Care (Signed)
Immediate Anesthesia Transfer of Care Note  Patient: Darryl Hooper.  Procedure(s) Performed: Procedure(s): APPLICATION OF WOUND VAC/ SACRUM WOUND VAC CHANGE (N/A)  Patient Location: PACU  Anesthesia Type:General  Level of Consciousness: awake and patient cooperative  Airway & Oxygen Therapy: Patient Spontanous Breathing and Patient connected to nasal cannula oxygen  Post-op Assessment: Report given to RN, Post -op Vital signs reviewed and stable and Patient moving all extremities X 4  Post vital signs: Reviewed and stable  Last Vitals:  Filed Vitals:   02/05/15 0812  BP: 140/54  Pulse: 64  Temp: 37.1 C  Resp: 20    Complications: No apparent anesthesia complications

## 2015-02-05 NOTE — Progress Notes (Signed)
[facility] [place] NAME: Darryl Hooper    MR#:  [id number]  DATE OF BIRTH:  June 10, 1929  SUBJECTIVE:  CHIEF COMPLAINT:   Chief Complaint  Patient presents with  . Failure To Thrive  . URI  no new complaints, family to meet with palliative care tomorrow to decide Hospice?, going to OR today for more debridement  REVIEW OF SYSTEMS:  Review of Systems  Unable to perform ROS: acuity of condition   DRUG ALLERGIES:  No Known Allergies VITALS:  Blood pressure 140/54, pulse 64, temperature 98.8 F (37.1 C), temperature source Oral, resp. rate 20, height 6\' 2"  (1.88 m), weight 97.523 kg (215 lb), SpO2 100 %. PHYSICAL EXAMINATION:  Physical Exam  Constitutional: He is oriented to person, place, and time. He appears lethargic, malnourished and dehydrated. He appears unhealthy. He appears cachectic. He appears toxic. He has a sickly appearance.  HENT:  Head: Normocephalic and atraumatic.  Eyes: Conjunctivae and EOM are normal. Pupils are equal, round, and reactive to light.  Neck: Normal range of motion. Neck supple. No tracheal deviation present. No thyromegaly present.  Cardiovascular: Normal rate, regular rhythm and normal heart sounds.   Pulmonary/Chest: Effort normal and breath sounds normal. No respiratory distress. He has no wheezes. He exhibits no tenderness.  Abdominal: Soft. Bowel sounds are normal. He exhibits no distension. There is no tenderness.  Musculoskeletal: Normal range of motion.  Neurological: He is oriented to person, place, and time. He appears lethargic. No cranial nerve deficit.  Skin: Skin is warm and dry. No rash noted.  Sacral decubitus - wound vac in place  Psychiatric: Mood, affect and judgment normal.   LABORATORY PANEL:   CBC  Recent Labs Lab 02/05/15 0426  WBC 11.8*  HGB 7.4*  HCT 22.6*  PLT 331    ------------------------------------------------------------------------------------------------------------------ Chemistries   Recent Labs Lab 02/01/15 1536  02/05/15 0426  NA 131*  < > 130*  K 5.4*  < > 4.8  CL 103  < > 104  CO2 21*  < > 20*  GLUCOSE 132*  < > 133*  BUN 65*  < > 29*  CREATININE 2.02*  < > 1.61*  CALCIUM 8.5*  < > 7.3*  AST 95*  --   --   ALT 88*  --   --   ALKPHOS 78  --   --   BILITOT 0.4  --   --   < > = values in this interval not displayed. RADIOLOGY:  No results found. ASSESSMENT AND PLAN:   * Hypoglycemia with a history of Diabetes mellitus, type 2 - resolved, sugars in 130s  * hyperkalemia: resolved  *Hypotension with a history of Benign essential HTN - blood pressure is on the low side, hold antihypertensives for now  * Anemia: Likely acute on chronic blood loss, s/p 1 unit of packed red blood cell transfusion. hemoglobin is down to 6.4 -> 7.7 -> 7.4  * CKD stage III: close to baseline. monitor  * Arthritis - Appropriate pain management for this in conjunction with pain management of his above principal problem  * Abscess of male pelvis - irrigation and debridement sacral wound abscess X 2 and placement of wound VAC, management per surgery.  Wound VAC working well. Plan is for wound VAC change and further debridement and removal of Penrose drain today under anesthesia per surgery  * Right hydronephrosis and hydroureter: Urology input appreciated  Appreciate palliative care input.  Likely another  meeting tomorrow. Now DNR  All the records are reviewed and case discussed with Care Management/Social Worker. Management plans discussed with the patient, family and they are in agreement.  CODE STATUS: DNR  TOTAL TIME TAKING CARE OF THIS PATIENT: 25 minutes.   More than 50% of the time was spent in counseling/coordination of care: Augustina Mood, Elizebath Wever M.D on 02/05/2015 at 1:56 PM  Between 7am to 6pm - Pager - [phone]  After 6pm go to  [email or web address] - password [email or web address] [facility]  Office  [phone]  CC:  Primary care physician; Dicky Doe, MD

## 2015-02-05 NOTE — Anesthesia Procedure Notes (Signed)
Procedure Name: Intubation Date/Time: 02/05/2015 2:01 PM Performed by: Alda Berthold Pre-anesthesia Checklist: Patient identified, Patient being monitored, Timeout performed, Emergency Drugs available and Suction available Patient Re-evaluated:Patient Re-evaluated prior to inductionOxygen Delivery Method: Circle system utilized Preoxygenation: Pre-oxygenation with 100% oxygen Intubation Type: IV induction Ventilation: Mask ventilation without difficulty Laryngoscope Size: McGraph and 4 Grade View: Grade I Tube type: Oral Tube size: 7.0 mm Number of attempts: 1 Airway Equipment and Method: Stylet Placement Confirmation: ETT inserted through vocal cords under direct vision,  positive ETCO2 and breath sounds checked- equal and bilateral Secured at: 21 cm Tube secured with: Tape Dental Injury: Teeth and Oropharynx as per pre-operative assessment

## 2015-02-05 NOTE — Telephone Encounter (Signed)
Called wife to check on her and patient since having so much going on at hospital. [facility]

## 2015-02-05 NOTE — Clinical Social Work Note (Signed)
Patient's only bed offer is from [facility]. CSW contacted patient's daughter and informed her of this. CSW is aware that there will be a meeting between the family and Palliative Care tomorrow. Unsure at this time what the result of that meeting will be. CSW has reviewed previous Palliative Care documentation. Will continue to follow. Shela Leff MSW,LCSW [phone]

## 2015-02-05 NOTE — Op Note (Signed)
02/01/2015 - 02/05/2015  2:37 PM  PATIENT:  [name]  81 y.o. male  PRE-OPERATIVE DIAGNOSIS:  wound vac change  POST-OPERATIVE DIAGNOSIS:  wound vac change  PROCEDURE:  Procedure(s): APPLICATION OF WOUND VAC/ SACRUM WOUND VAC CHANGE (N/A) Greater than 50 cm2.  SURGEON:  Surgeon(s) and Role:     [name], MD - Primary  ASSISTANTS: none  ANESTHESIA: General endotracheal     SPECIMEN:  none     EBL: none.  Description of procedure:    With informed consent general anesthesia prone positioning and padding the existing wound VAC was removed.  The wound was irrigated. Existing Penrose drain was removed. An irrigated. It was no significant necrotic material. A large piece of black granular foam was cut to size inserted into the defect measuring 8 x 15 x 5 cm deep. It was occluded with Bioclusive film. Track pad was laid laterally. Negative pressure was achieved. The patient was then returned supine and then extubated and taken to the recovery room in stable and satisfactory.

## 2015-02-06 LAB — CULTURE, BLOOD (ROUTINE X 2): Culture: NO GROWTH

## 2015-02-06 LAB — GLUCOSE, CAPILLARY
GLUCOSE-CAPILLARY: 131 mg/dL — AB (ref 65–99)
Glucose-Capillary: 124 mg/dL — ABNORMAL HIGH (ref 65–99)
Glucose-Capillary: 134 mg/dL — ABNORMAL HIGH (ref 65–99)
Glucose-Capillary: 138 mg/dL — ABNORMAL HIGH (ref 65–99)

## 2015-02-06 MED ORDER — SENNOSIDES-DOCUSATE SODIUM 8.6-50 MG PO TABS
2.0000 | ORAL_TABLET | Freq: Two times a day (BID) | ORAL | Status: DC
  Administered 2015-02-06 – 2015-02-07 (×3): 2 via ORAL
  Filled 2015-02-06 (×3): qty 2

## 2015-02-06 NOTE — Progress Notes (Signed)
Patient ID: Darryl Suess., male   DOB: 13-Nov-1929, 81 y.o.   MRN: [id number]   Surgery  Postoperative day #1 status post reapplication of wound VAC. He continues to do poorly with intermittent episodes of confusion.  Palliative care medicine Dr. Rogue Jury did meet with the patient and his extended family today in the hospital room. At this point they are not ready for hospice care. They will continue the wound VAC. Discussions were entertained regarding feeding tube placement as he is eating very poorly and his abdomen on September 3 was only 2.0.  Plans will need be made for him to be transferred to a [facility] because the patient's family does not want him to return there. At present the [facility] is holding nicely. He is eating very poorly.  Impression overall I think Darryl Hooper is doing very poorly with failure to thrive. The family wants to give him more time. I am not in disagreement with this. Plan continue wound VAC continue supportive care as position planning has been initiated with case management involvement.

## 2015-02-06 NOTE — Progress Notes (Signed)
Palliative Care Update   Please see note about family conference from earlier today.   Family has changed the general direction of which way they want to go from here.  They want continued aggressive care.  Patient's daughter and son-in-law seemed to be the ones making the ultimate decision, as pt's wife deferred to them in terms of decision making.    Pt has a bed offer at [facility].  Social Worker is talking with family about this option for return to skilled care.  Family wants to know how long the wound vac will need to be in place and they want follow up appointments with his various doctors.   I am signing off at this time, but contact me again if needed.    Kirby Funk,  MD Palliative Care

## 2015-02-06 NOTE — Patient Outreach (Signed)
[facility]) Care Management  02/06/2015  Darryl Hooper 1929-11-23 [id number]   Notification from Gentry Fitz, RN patient is inpatient at [facility], assigned Joylene Draft, RN to outreach.  Thanks, Ronnell Freshwater. [place], [place] Assistant Phone: [phone] Fax: [phone]

## 2015-02-06 NOTE — Clinical Social Work Note (Signed)
CSW met with patients, daughter, son in law, son, and wife with Dr. Megan Salon. Family wishes for aggressive care at this time. CSW has called [facility] and they are looking at patient's referral again for reconsideration (per patient's family's request). [place] does not currently have a wound care nurse or a nurse that can manage a wound vac. Dr. Marina Gravel states that patient will require the wound vac at discharge. CSW has updated patient's son and daughter this afternoon and will follow up with them in the morning.  Shela Leff MSW,LCSW [phone]

## 2015-02-06 NOTE — Progress Notes (Signed)
[place] [facility] [place] NAME: Vilas Edgerly    MR#:  [id number]  DATE OF BIRTH:  1930-05-30  SUBJECTIVE:  CHIEF COMPLAINT:   Chief Complaint  Patient presents with  . Failure To Thrive  . URI   loss of coughing, not able to expectorate, family to meet with palliative care today decide Hospice? REVIEW OF SYSTEMS:  Review of Systems  Unable to perform ROS: acuity of condition   DRUG ALLERGIES:  No Known Allergies VITALS:  Blood pressure 134/83, pulse 76, temperature 98.7 F (37.1 C), temperature source Oral, resp. rate 18, height 6\' 2"  (1.88 m), weight 97.523 kg (215 lb), SpO2 99 %. PHYSICAL EXAMINATION:  Physical Exam  Constitutional: He is oriented to person, place, and time. He appears lethargic, malnourished and dehydrated. He appears unhealthy. He appears cachectic. He appears toxic. He has a sickly appearance.  HENT:  Head: Normocephalic and atraumatic.  Eyes: Conjunctivae and EOM are normal. Pupils are equal, round, and reactive to light.  Neck: Normal range of motion. Neck supple. No tracheal deviation present. No thyromegaly present.  Cardiovascular: Normal rate, regular rhythm and normal heart sounds.   Pulmonary/Chest: Tachypnea noted. He is in respiratory distress. He has decreased breath sounds. He has no wheezes. He has rhonchi. He exhibits no tenderness.  Abdominal: Soft. Bowel sounds are normal. He exhibits no distension. There is no tenderness.  Musculoskeletal: Normal range of motion.  Neurological: He is oriented to person, place, and time. He appears lethargic. No cranial nerve deficit.  Skin: Skin is warm and dry. No rash noted.  Sacral decubitus - wound vac in place  Psychiatric: Mood, affect and judgment normal.   LABORATORY PANEL:   CBC  Recent Labs Lab 02/05/15 0426  WBC 11.8*  HGB 7.4*  HCT 22.6*  PLT 331    ------------------------------------------------------------------------------------------------------------------ Chemistries   Recent Labs Lab 02/01/15 1536  02/05/15 0426  NA 131*  < > 130*  K 5.4*  < > 4.8  CL 103  < > 104  CO2 21*  < > 20*  GLUCOSE 132*  < > 133*  BUN 65*  < > 29*  CREATININE 2.02*  < > 1.61*  CALCIUM 8.5*  < > 7.3*  AST 95*  --   --   ALT 88*  --   --   ALKPHOS 78  --   --   BILITOT 0.4  --   --   < > = values in this interval not displayed. RADIOLOGY:  No results found. ASSESSMENT AND PLAN:   * Hypoglycemia with a history of Diabetes mellitus, type 2 - resolved, sugars in 130s  * hyperkalemia: resolved  *Hypotension with a history of Benign essential HTN - blood pressure is on the low side, hold antihypertensives for now  * Anemia: Likely acute on chronic blood loss, s/p 1 unit of packed red blood cell transfusion. hemoglobin is down to 6.4 -> 7.7 -> 7.4. Will recheck hb in am  * CKD stage III: close to baseline. monitor  * Arthritis - Appropriate pain management for this in conjunction with pain management of his above principal problem  * Abscess of male pelvis - irrigation and debridement sacral wound abscess X 2 and placement of wound VAC, management per surgery.  Wound VAC working well.  Went back to surgery yesterday ad had application OF WOUND VAC/ SACRUM WOUND VAC CHANGE (N/A)  * Right hydronephrosis and hydroureter: Urology input appreciated  Appreciate palliative  care input.  Likely another meeting tomorrow. Now DNR  All the records are reviewed and case discussed with Care Management/Social Worker. Management plans discussed with the patient, family and they are in agreement.  CODE STATUS: DNR  TOTAL TIME TAKING CARE OF THIS PATIENT: 25 minutes.   More than 50% of the time was spent in counseling/coordination of care: YES  Looking at palliative care note - Family does not seem to be leaning towards hospice, may need  rehab   [facility], Kaicee Scarpino M.D on 02/06/2015 at 1:55 PM  Between 7am to 6pm - Pager - [phone]  After 6pm go to [email or web address] - password [email or web address] [facility]  Office  [phone]  CC: Primary care physician; Dicky Doe, MD

## 2015-02-06 NOTE — Progress Notes (Signed)
PT Cancellation Note  Patient Details Name: Darryl Hooper. MRN: [id number] DOB: [birth year]   Cancelled Treatment:    Reason Eval/Treat Not Completed: Other (comment) (Patient pending family conference this date to determine plans of care this date.  Will hold PT until plan established and patient/family desiring to pursue PT.  Noted plans about possible transition to hospice home.)  Kordae Buonocore H. Owens Shark, PT, DPT, NCS 02/06/2015, 9:37 AM [phone]

## 2015-02-06 NOTE — Consult Note (Signed)
Palliative Medicine Inpatient Consult Follow Up Note   Name: Darryl Hooper. Date: 02/06/2015 MRN: [id number]  DOB: 02-Apr-1930  Referring Physician: [name], MD  Palliative Care consult requested for this 81 y.o. male for goals of medical therapy in patient with a sacral abscess and ulcer, malnutrition, delerium, and an oral lesion seen on PET scan --all related to recent diagnosis and surgery for Stage IIa Rectal Cancer.  I had spoken with the patient's daughter by phone two days ago and asked that all family come in today for a meeting about goals of care.    TODAY'S CONVERSATIONS, EVENTS, AND PLANS:  1.  I met with the patient's wife, daughter, son, and son-in-law.   The bottom line is that the family has elected to continue aggressive care.  I asked Brayton Layman, Education officer, museum, to join [employer] (once it was quite apparent that there would be no consideration for Hospice care or Hospice Home) so she could discuss pt going to SNF.  There is one facility that has offered a bed:  [facility].  Family really prefers [facility] but they turned pt down for bed there.  Brayton Layman will call them again.  It is advised that there be a Palliative Care Consult in the facility.  Family did not object to this.  Pt will need follow up appointments with Dr. Marina Gravel (surgeon), Dr Shari Heritage (oncologist), and  Dr Elnoria Howard (Urologist).     2.  DNR status remains.    3.  The dynamics of the family wanting to go this route include the following:  ---Patient's wife, while saying things like, "He seems weak and he seems like he has given up"---is not completely logical in decision making because it is readily apparent(to me at least)  that she has probably got a mild dementia.  It may not be formally diagnosed, but she repeats questions and does not always follow the conversation. Some of this is due to her being hard of hearing ----but even when she hears well, she does not follow the conversation or the  implications of what is being talked about.    ----Patient's son, who has MS, seemed to weigh in stating that he knew about the risk of infections with a foley and also stated that he had been very pleased by Hospice Care of some other relatives in his family.  He did not weigh in one way or the other in terms of pt going to [facility] or SNF, except to say that he felt that the patient himself ought to be discussing this matter.  Pt is, at this time, too lethargic and was quite confused last night.   ---Patient's daughter, who I spoke with two days ago by phone, and who, at that time, seemed somewhat receptive to consideration of Hospice, seems to have changed her mind in the interim.  She was tearful and said she felt like her dad would not want to give up --- so she is in favor of continuing aggressive care.  ---Patient's son-in-law was the most vocal person in this meeting.  He started out asking questions such as, "Why did they do surgery on him in the first plact?" and as I addressed his questions as best as I could (though some were not answerable by me as they have to do with past events when I was not involved), it became apparent that he felt somewhat angry.  I asked him about this and he said he was '  playing [place] advocate' in that 'somebody has to be here who is looking out for HIS interest and that's me!'  When he was told that I also was looking out for his father-in-law's interest, I was met with a definite sign that he did not feel this to be the case.  He stated that he knew that 'in this [place] there is a trent to just look at someone's age and then decide to write them off and just put them somewhere."  He continued to have answers for pt's described conditions --stating that pt's confusion is due to anesthesia and that will clear up --and that he will eat more after the anesthesia wears off--- and that if he gets better care, his ulcer should heal, etc     ---The son-in-law asked if  pt could 'stay here till ulcer heals'.  Brayton Layman was present by that time and she told them insurance would not pay for that since there is a place that can do his wound care.  Family plans to go see [facility] to check it out.    4.  While a Palliative Care consult is certainly appropriate in the facility, I will sign off at this time, for there is no need for further supportive or goal-setting conversations to be held.  Note that family stated that Dr. Shari Heritage did not seem worried about 'the oral finding on PET scan' --so this may need further discussion. The family also wants to know more about under which circumstances he could get chemo (I discussed functional status).     PLEASE PUT INTO DISCHARGE SUMMARY (thank you)  ORDER:  PLEASE CALL FOR PALLIATIVE CARE TO FOLLOW PATIENT IN FACILITY    REVIEW OF SYSTEMS:  Patient is not able to provide ROS since he is sleeping/ still with effects of anesthesia yesterday vs  due to exhaustion from delirium episode last night.   CODE STATUS: DNR   PAST MEDICAL HISTORY: Past Medical History  Diagnosis Date  . Hypertension   . Peripheral vascular disease   . Hyperlipidemia   . Diarrhea   . Diabetes mellitus without complication   . BPH (benign prostatic hyperplasia)   . Prostate cancer   . Arthritis   . Knee pain, bilateral   . Rectal cancer   . Chronic kidney disease     incontience  . IBS (irritable bowel syndrome)   . Neuropathy due to secondary diabetes mellitus   . Hx of pulmonary embolus     2011--treated with Coumadin    PAST SURGICAL HISTORY:  Past Surgical History  Procedure Laterality Date  . Prostatectomy    . Joint replacement      left knee  . Colonoscopy N/A 10/23/2014    Procedure: COLONOSCOPY;  Surgeon: Lucilla Lame, MD;  Location: [place];  Service: Gastroenterology;  Laterality: N/A;  . Knee arthroscopy w/ meniscal repair Left 2012  . Eus N/A 11/14/2014    Procedure: LOWER ENDOSCOPIC ULTRASOUND (EUS);  Surgeon:  Holly Bodily, MD;  Location: [facility] ENDOSCOPY;  Service: Endoscopy;  Laterality: N/A;  . Eye surgery Bilateral     cataract extraction  . Tonsillectomy    . Abdominal perineal bowel resection N/A 12/12/2014    Procedure: ABDOMINAL PERINEAL RESECTION;  Surgeon: [name], MD;  Location: [facility] ORS;  Service: General;  Laterality: N/A;  . Cystoscopy with stent placement Bilateral 12/12/2014    Procedure: cystoscopy, bilateral retrogrades, left ureteral stent placement, attempted right stent placement.;  Surgeon: Hollice Espy, MD;  Location: [facility] ORS;  Service: Urology;  Laterality: Bilateral;  . Irrigation and debridement abscess N/A 12/23/2014    Procedure: IRRIGATION AND DEBRIDEMENT ABSCESS/ PERINEAL WOUND DEBRIDMENT;  Surgeon: Marlyce Huge, MD;  Location: [facility] ORS;  Service: General;  Laterality: N/A;  . Irrigation and debridement abscess N/A 12/25/2014    Procedure: IRRIGATION AND DEBRIDEMENT ABSCESS;  Surgeon: Marlyce Huge, MD;  Location: [facility] OR[facility];  Service: General;  Laterality: N/A;  . Application of wound vac N/A 12/25/2014    Procedure: APPLICATION OF WOUND VAC;  Surgeon: Marlyce Huge, MD;  Location: [facility];  Service: General;  Laterality: N/A;  . Minor application of wound vac N/A 12/30/2014    Procedure: MINOR APPLICATION OF WOUND VAC;  Surgeon: [name], MD;  Location: [facility] OR[facility];  Service: General;  Laterality: N/A;  . Irrigation and debridement buttocks N/A 02/03/2015    Procedure: irrigation and debridement sacral wound abscess X 2;  Surgeon: [name], MD;  Location: [facility];  Service: General;  Laterality: N/A;  . Application of wound vac N/A 02/05/2015    Procedure: APPLICATION OF WOUND VAC/ SACRUM WOUND VAC CHANGE;  Surgeon: [name], MD;  Location: [facility] ORS;  Service: General;  Laterality: N/A;    Vital Signs: BP 134/83 mmHg  Pulse 76  Temp(Src) 98.7 F (37.1 C) (Oral)  Resp 18  Ht 6' 2"  (1.88 m)  Wt 97.523 kg (215 lb)  BMI 27.59 kg/m2  SpO2  99% Filed Weights   02/01/15 1505  Weight: 97.523 kg (215 lb)    Estimated body mass index is 27.59 kg/(m^2) as calculated from the following:   Height as of this encounter: 6' 2"  (1.88 m).   Weight as of this encounter: 97.523 kg (215 lb).  PHYSICAL EXAM: He is sleeping --a bit askew in bed with head resting on side rail  Snoring noted Sats 100% however on room air Neck w/o JVD or TM Heart rrr no murmur heard Abd soft with nl BS He was not turned to investigate sacral ulcer with wound vac Ext --wearing TEDs --no gross edema noted  LABS: CBC:    Component Value Date/Time   WBC 11.8* 02/05/2015 0426   HGB 7.4* 02/05/2015 0426   HCT 22.6* 02/05/2015 0426   PLT 331 02/05/2015 0426   MCV 85.1 02/05/2015 0426   NEUTROABS 21.6* 02/01/2015 1536   LYMPHSABS 0.7* 02/01/2015 1536   MONOABS 1.0 02/01/2015 1536   EOSABS 0.1 02/01/2015 1536   BASOSABS 0.0 02/01/2015 1536   Comprehensive Metabolic Panel:    Component Value Date/Time   NA 130* 02/05/2015 0426   K 4.8 02/05/2015 0426   CL 104 02/05/2015 0426   CO2 20* 02/05/2015 0426   BUN 29* 02/05/2015 0426   CREATININE 1.61* 02/05/2015 0426   GLUCOSE 133* 02/05/2015 0426   CALCIUM 7.3* 02/05/2015 0426   AST 95* 02/01/2015 1536   ALT 88* 02/01/2015 1536   ALKPHOS 78 02/01/2015 1536   BILITOT 0.4 02/01/2015 1536   PROT 6.1* 02/01/2015 1536   ALBUMIN 2.0* 02/01/2015 1536    TESTS:  PET scan 11/08/14: 1. Focal hypermetabolism in an area of right-sided rectal wall thickening, compatible with the reported history of rectal neoplasm. 2. Unexpected markedly hypermetabolic 2.5 cm soft tissue lesion in the left oropharyngeal region, causing mass-effect on the oropharynx. Area is difficult to assess given streak artifact from dental fillings, but this lesion may be related to lymphadenopathy rather than an oropharyngeal mucosal lesion. FDG uptake is in the malignant range. This  would be an unusual location for metastatic rectal  cancer given the absence of other definite metastatic lesions on this study. 3. Brachytherapy seeds in the prostate bed. No evidence for hypermetabolic bone lesions.  IMPRESSION: 1. Pre-sacral Abscess presenting with fever and chills and leukocytosis ---debrided  on 02/03/15 with wound vac placed ---debrided on 02/05/15 with replacement of wound vac 2. Sacral Ulcer --development post perineal wound resection with perineal wound necrosis ---size of defect now noted to be 8 x 15 x 5 cm deep.  ---Wound Vac in place (difficult location to maintain a proper seal at all times) 3. Severe malnutrition associated with anorexia of cancer 4. Elevated Liver Function tests 5. Severe anemia with weakness 6. Right hydronephrosis ---possibly due to bladder outlet obstruction --hence foley is placed ---possibly duet to partial UVJ stricture (so repeat imaging is to be done by Dr. Man, urology.  ---right ureteral stent could not be placed in July when he had his colon surgery (thought possibly due to distal stricture) 7. Acute renal failure on CKD stage III ---Baseline Cr = 1.1 and Cr was in 2.0 -2.5 range ---Cr now 1.75 ---Right hydronephrosis is likely contributing ---He is diabetic and may have advancing medical renal disease  ---Left kidney > right kidney atrophy noted also 8. HTN ---with borderline low BP this adm 9. DM2 with Diabetic Peripheral Neuropathy 10. DJD with chronic joint pain 11. MRSA screen positive 12. Recent perineal resection for stage 11a rectal cancer  ---with complications due to fibrosis/ scarring from prior brachiotherapy for prostate cancer ---with PET scan positive for oral lesion of undetermined etiology (met vs new primary) 13. History of Prostate Cancer treated with brachiotherapy  14. Weakness 15. Anorexia and Failure to Thrive in an adult  21.  Delirum 17.  Hyponatremia 18.  Hyperglycemia with DM2 (but not requiring home insulin due to not eating  well) 19.  Leukocytosis 20.  H/O PE in 2012 --treated with coumadin for a while 21.  Placement of left ureteral stent during rectal cancer surgery --with failed attempt to place stent in right ureter at that time.   22.  Bilateral knee pain (chronic DJD pain)   See top of note for PLAN  REFERRALS TO BE ORDERED:  Social Work is involved in arranging SNF placement for pt   More than 50% of the visit was spent in counseling/coordination of care: YES  Time Spent: 17mn

## 2015-02-06 NOTE — Clinical Social Work Note (Signed)
Patient has received a bed offer from [place] [facility] and they will be able to take patient tomorrow with a wound vac. CSW has relayed this to the family. Shela Leff MSW,LCSW [phone]

## 2015-02-07 DIAGNOSIS — I10 Essential (primary) hypertension: Secondary | ICD-10-CM | POA: Diagnosis not present

## 2015-02-07 DIAGNOSIS — E785 Hyperlipidemia, unspecified: Secondary | ICD-10-CM | POA: Diagnosis not present

## 2015-02-07 DIAGNOSIS — I129 Hypertensive chronic kidney disease with stage 1 through stage 4 chronic kidney disease, or unspecified chronic kidney disease: Secondary | ICD-10-CM | POA: Diagnosis not present

## 2015-02-07 DIAGNOSIS — E162 Hypoglycemia, unspecified: Secondary | ICD-10-CM | POA: Diagnosis not present

## 2015-02-07 DIAGNOSIS — J449 Chronic obstructive pulmonary disease, unspecified: Secondary | ICD-10-CM | POA: Diagnosis not present

## 2015-02-07 DIAGNOSIS — C20 Malignant neoplasm of rectum: Secondary | ICD-10-CM | POA: Diagnosis not present

## 2015-02-07 DIAGNOSIS — E119 Type 2 diabetes mellitus without complications: Secondary | ICD-10-CM | POA: Diagnosis not present

## 2015-02-07 DIAGNOSIS — L89154 Pressure ulcer of sacral region, stage 4: Secondary | ICD-10-CM | POA: Diagnosis not present

## 2015-02-07 DIAGNOSIS — N400 Enlarged prostate without lower urinary tract symptoms: Secondary | ICD-10-CM | POA: Diagnosis not present

## 2015-02-07 DIAGNOSIS — N189 Chronic kidney disease, unspecified: Secondary | ICD-10-CM | POA: Diagnosis not present

## 2015-02-07 DIAGNOSIS — T8132XA Disruption of internal operation (surgical) wound, not elsewhere classified, initial encounter: Secondary | ICD-10-CM | POA: Diagnosis not present

## 2015-02-07 DIAGNOSIS — Z66 Do not resuscitate: Secondary | ICD-10-CM | POA: Diagnosis not present

## 2015-02-07 DIAGNOSIS — Z96652 Presence of left artificial knee joint: Secondary | ICD-10-CM | POA: Diagnosis not present

## 2015-02-07 DIAGNOSIS — E0840 Diabetes mellitus due to underlying condition with diabetic neuropathy, unspecified: Secondary | ICD-10-CM | POA: Diagnosis not present

## 2015-02-07 DIAGNOSIS — Z933 Colostomy status: Secondary | ICD-10-CM | POA: Diagnosis not present

## 2015-02-07 DIAGNOSIS — R52 Pain, unspecified: Secondary | ICD-10-CM | POA: Diagnosis not present

## 2015-02-07 DIAGNOSIS — T8132XD Disruption of internal operation (surgical) wound, not elsewhere classified, subsequent encounter: Secondary | ICD-10-CM | POA: Diagnosis not present

## 2015-02-07 DIAGNOSIS — R627 Adult failure to thrive: Secondary | ICD-10-CM | POA: Diagnosis not present

## 2015-02-07 DIAGNOSIS — R2689 Other abnormalities of gait and mobility: Secondary | ICD-10-CM | POA: Diagnosis not present

## 2015-02-07 DIAGNOSIS — X58XXXA Exposure to other specified factors, initial encounter: Secondary | ICD-10-CM | POA: Diagnosis not present

## 2015-02-07 DIAGNOSIS — E11622 Type 2 diabetes mellitus with other skin ulcer: Secondary | ICD-10-CM | POA: Diagnosis not present

## 2015-02-07 DIAGNOSIS — D649 Anemia, unspecified: Secondary | ICD-10-CM | POA: Diagnosis not present

## 2015-02-07 DIAGNOSIS — I739 Peripheral vascular disease, unspecified: Secondary | ICD-10-CM | POA: Diagnosis not present

## 2015-02-07 DIAGNOSIS — K651 Peritoneal abscess: Secondary | ICD-10-CM | POA: Diagnosis not present

## 2015-02-07 DIAGNOSIS — X58XXXD Exposure to other specified factors, subsequent encounter: Secondary | ICD-10-CM | POA: Diagnosis not present

## 2015-02-07 DIAGNOSIS — N401 Enlarged prostate with lower urinary tract symptoms: Secondary | ICD-10-CM | POA: Diagnosis not present

## 2015-02-07 DIAGNOSIS — Z515 Encounter for palliative care: Secondary | ICD-10-CM | POA: Diagnosis not present

## 2015-02-07 DIAGNOSIS — E1140 Type 2 diabetes mellitus with diabetic neuropathy, unspecified: Secondary | ICD-10-CM | POA: Diagnosis not present

## 2015-02-07 DIAGNOSIS — M1990 Unspecified osteoarthritis, unspecified site: Secondary | ICD-10-CM | POA: Diagnosis not present

## 2015-02-07 DIAGNOSIS — R531 Weakness: Secondary | ICD-10-CM | POA: Diagnosis not present

## 2015-02-07 DIAGNOSIS — C61 Malignant neoplasm of prostate: Secondary | ICD-10-CM | POA: Diagnosis not present

## 2015-02-07 LAB — GLUCOSE, CAPILLARY: Glucose-Capillary: 141 mg/dL — ABNORMAL HIGH (ref 65–99)

## 2015-02-07 MED ORDER — HYDROCODONE-ACETAMINOPHEN 5-325 MG PO TABS: 1.0000 | ORAL_TABLET | Freq: Four times a day (QID) | ORAL | Status: AC | PRN

## 2015-02-07 NOTE — Discharge Summary (Signed)
Physician Discharge Summary  Patient ID: Darryl Hooper. MRN: [id number] DOB/AGE: [birth year] 81 y.o.  Admit date: 02/01/2015 Discharge date: 02/07/2015  Admission Diagnoses:    Presacral abscess sacral decubitus ulcer, stage 4 failure to thrive Rectal cancer  Discharge Diagnoses:  Same Discharged Condition: Poor  Hospital Course: She was admitted with leukocytosis and failure to thrive. Imaging demonstrated a small presacral abscess. He had a large sacral decubitus ulcer which required sharp debridement. The patient was admitted to the hospital was brought to the operating room on Monday, September 5 for sharp debridement of ulcer and application of wound VAC assisted closure device. Concurrently the presacral abscess was drained posteriorly. Postoperatively he had continued failure to thrive. Discussions were undertaken with the family with the assistance of palliative care medicine regarding his CODE STATUS. He was made changed from a full code to a DO NOT RESUSCITATE. The patient's family did not want him to be placed in hospice. Arrangements were made for a different nursing home from once he came from. The patient's wound VAC was changed on September 7 in the operating room and there was no further signs of necrosis.  Consults: Internal medicine and palliative care medicine.  Significant Diagnostic Studies: CT scan of the abdomen and pelvis  Treatments: As described in Hospital course above.  Discharge Exam: Blood pressure 109/55, pulse 76, temperature 98.4 F (36.9 C), temperature source Oral, resp. rate 18, height 6\' 2"  (1.88 m), weight 215 lb (97.523 kg), SpO2 95 %.  The patient is very weak and intermittently confused. His ostomy is functioning. His abdomen is soft. There is a wound VAC in place on the sacrum.  Disposition: 03-Skilled Nursing Facility  Discharge Instructions    Diet general    Complete by:  As directed      Discharge wound care:    Complete by:  As directed    VAC changed every three days to sacral decubitus ulcer.   Black foam.  Place trac-pad laterally via foam bridge.     Increase activity slowly    Complete by:  As directed             Medication List    STOP taking these medications        levofloxacin 250 MG tablet  Commonly known as:  LEVAQUIN      TAKE these medications        acetaminophen 650 MG CR tablet  Commonly known as:  TYLENOL  Take 650 mg by mouth 2 (two) times daily.     amLODipine 2.5 MG tablet  Commonly known as:  NORVASC  Take 1 tablet (2.5 mg total) by mouth daily.     feeding supplement (PRO-STAT SUGAR FREE 64) Liqd  Take 30 mLs by mouth 3 (three) times daily.     ferrous sulfate 325 (65 FE) MG tablet  Take 325 mg by mouth 3 (three) times daily.     gabapentin 100 MG capsule  Commonly known as:  NEURONTIN  Take 300 mg by mouth at bedtime.     glimepiride 4 MG tablet  Commonly known as:  AMARYL  Take 4 mg by mouth daily.     HYDROcodone-acetaminophen 5-325 MG per tablet  Commonly known as:  NORCO/VICODIN  Take 1-2 tablets by mouth every 6 (six) hours as needed for moderate pain.     losartan 100 MG tablet  Commonly known as:  COZAAR  Take 1 tablet (100 mg total) by mouth daily.  multivitamin tablet  Take 1 tablet by mouth daily.     sennosides-docusate sodium 8.6-50 MG tablet  Commonly known as:  SENOKOT-S  Take 1 tablet by mouth at bedtime.     vitamin C 500 MG tablet  Commonly known as:  ASCORBIC ACID  Take 500 mg by mouth 2 (two) times daily.     Vitamin D-3 1000 UNITS Caps  Take 1,000 Units by mouth daily.     zinc sulfate 220 MG capsule  Take 220 mg by mouth every other day.        We will see the patient back in 7-10 days time in the office.   SignedSherri Rad 02/07/2015, 9:00 AM

## 2015-02-07 NOTE — Progress Notes (Signed)
[facility] NAME: Darryl Hooper    MR#:  [id number]  DATE OF BIRTH:  07-04-1929  SUBJECTIVE:  CHIEF COMPLAINT:   Chief Complaint  Patient presents with  . Failure To Thrive  . URI  Sleepy. REVIEW OF SYSTEMS:  Review of Systems  Unable to perform ROS: acuity of condition   DRUG ALLERGIES:  No Known Allergies VITALS:  Blood pressure 109/55, pulse 76, temperature 98.4 F (36.9 C), temperature source Oral, resp. rate 18, height 6\' 2"  (1.88 m), weight 215 lb (97.523 kg), SpO2 95 %. PHYSICAL EXAMINATION:  Physical Exam  Constitutional: He is oriented to person, place, and time. He appears lethargic, malnourished and dehydrated. He appears unhealthy. He appears cachectic. He appears toxic. He has a sickly appearance.  HENT:  Head: Normocephalic and atraumatic.  Eyes: Conjunctivae and EOM are normal. Pupils are equal, round, and reactive to light.  Neck: Normal range of motion. Neck supple. No tracheal deviation present. No thyromegaly present.  Cardiovascular: Normal rate, regular rhythm and normal heart sounds.   Pulmonary/Chest: Tachypnea noted. No respiratory distress. He has decreased breath sounds. He has no wheezes. He has rhonchi. He exhibits no tenderness.  Abdominal: Soft. Bowel sounds are normal. He exhibits no distension. There is no tenderness.  Musculoskeletal: Normal range of motion.  Neurological: He is oriented to person, place, and time. He appears lethargic. No cranial nerve deficit.  Skin: Skin is warm and dry. No rash noted.  Sacral decubitus - wound vac in place  Psychiatric: Mood, affect and judgment normal.   LABORATORY PANEL:   CBC  Recent Labs Lab 02/05/15 0426  WBC 11.8*  HGB 7.4*  HCT 22.6*  PLT 331   ------------------------------------------------------------------------------------------------------------------ Chemistries   Recent Labs Lab 02/01/15 1536  02/05/15 0426  NA 131*  <  > 130*  K 5.4*  < > 4.8  CL 103  < > 104  CO2 21*  < > 20*  GLUCOSE 132*  < > 133*  BUN 65*  < > 29*  CREATININE 2.02*  < > 1.61*  CALCIUM 8.5*  < > 7.3*  AST 95*  --   --   ALT 88*  --   --   ALKPHOS 78  --   --   BILITOT 0.4  --   --   < > = values in this interval not displayed. RADIOLOGY:  No results found. ASSESSMENT AND PLAN:   * Hypoglycemia with a history of Diabetes mellitus, type 2 - resolved, sugars in 130s  * hyperkalemia: resolved  *Hypotension with a history of Benign essential HTN - blood pressure is on the low side, hold antihypertensives for now  * Anemia: Likely acute on chronic blood loss, s/p 1 unit of packed red blood cell transfusion. hemoglobin is down to 6.4 -> 7.7 -> 7.4.   * CKD stage III: close to baseline. monitor  * Arthritis - Appropriate pain management for this in conjunction with pain management of his above principal problem  * Abscess of male pelvis - irrigation and debridement sacral wound abscess X 2 and placement of wound VAC, management per surgery.  Wound VAC working well.  Went back to surgery yesterday ad had application OF WOUND VAC/ SACRUM WOUND VAC CHANGE (N/A)  * Right hydronephrosis and hydroureter: Urology input appreciated  Primary team is planing for D/C today to STR/SNF. Will sign off. D/w dr Felton Clinton.  All the records are reviewed and case discussed  with Care Management/Social Worker. Management plans discussed with the patient, family and they are in agreement.  CODE STATUS: DNR  TOTAL TIME TAKING CARE OF THIS PATIENT: 15 minutes.   More than 50% of the time was spent in counseling/coordination of care: YES  Looking at palliative care note - Family does not seem to be leaning towards hospice, may need rehab   [facility], Orabelle Rylee M.D on 02/07/2015 at 1:45 PM  Between 7am to 6pm - Pager - [phone]  After 6pm go to [email or web address] - password [email or web address] [facility]  Office  [phone]  CC: Primary care  physician; Dicky Doe, MD

## 2015-02-07 NOTE — Progress Notes (Signed)
Called report to RN at [facility]. EMS will transport patient to facility.

## 2015-02-10 ENCOUNTER — Other Ambulatory Visit: Payer: Self-pay | Admitting: *Deleted

## 2015-02-10 DIAGNOSIS — E0840 Diabetes mellitus due to underlying condition with diabetic neuropathy, unspecified: Secondary | ICD-10-CM | POA: Diagnosis not present

## 2015-02-10 DIAGNOSIS — I10 Essential (primary) hypertension: Secondary | ICD-10-CM | POA: Diagnosis not present

## 2015-02-10 DIAGNOSIS — N401 Enlarged prostate with lower urinary tract symptoms: Secondary | ICD-10-CM | POA: Diagnosis not present

## 2015-02-10 DIAGNOSIS — E119 Type 2 diabetes mellitus without complications: Secondary | ICD-10-CM | POA: Diagnosis not present

## 2015-02-10 DIAGNOSIS — Z933 Colostomy status: Secondary | ICD-10-CM | POA: Diagnosis not present

## 2015-02-10 NOTE — Patient Outreach (Signed)
[facility]) Care Management  02/10/2015  Murphy Duzan. [birth year] [id number]   Telephone screening call to the  home of  Darryl Hooper, his wife answered telephone confirmed name,date of birth and address of Darryl Hooper, she reports that Darryl Hooper is at [employer] as noted in [place]. I briefly introduced [employer], community. Mrs.Batie request a return call. Plan: I will attempt to directly reach Mr.Sandra if possible  by telephone for verbal agreement to speak with his wife  regarding [facility] care management services, I will consider [facility] social worker involvement for follow up.  Joylene Draft, RN, [facility] [phone]

## 2015-02-12 ENCOUNTER — Telehealth: Payer: Self-pay | Admitting: Family Medicine

## 2015-02-12 ENCOUNTER — Telehealth: Payer: Self-pay

## 2015-02-12 NOTE — Telephone Encounter (Signed)
Please call Kim with [facility] about referral for pt.  Her call back number is [phone]

## 2015-02-12 NOTE — Telephone Encounter (Signed)
Patient is currently at [facility] for rehab after surgery last week.  Darryl Hooper says they were told by Dr. Grayland Ormond the patient has stage IIAa rectal cancer but the hospital put on discharge instructions for the facility that he has stage 4 cancer. She states that [facility] has in their records that he has stage 4 cancer which limits him on the PT he is allowed to do.  Darryl Hooper is asking for clarification on her dad's diagnosis and staging.

## 2015-02-12 NOTE — Telephone Encounter (Signed)
R/T call to Kim at [facility]. She was trying to find out if Mr. Stein has POA on file. Answer no. She is aware that patient is @ [facility] and they will have to wait.

## 2015-02-13 NOTE — Telephone Encounter (Signed)
His PET scan does not reveal any metastatic disease.  The tonsil lesion is likely non-malignant.  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  Maybe that is were the confusion lies.

## 2015-02-13 NOTE — Telephone Encounter (Signed)
Susan notified.

## 2015-02-14 DIAGNOSIS — L89154 Pressure ulcer of sacral region, stage 4: Secondary | ICD-10-CM | POA: Diagnosis not present

## 2015-02-14 DIAGNOSIS — R627 Adult failure to thrive: Secondary | ICD-10-CM | POA: Diagnosis not present

## 2015-02-17 ENCOUNTER — Ambulatory Visit: Payer: Medicare Other | Admitting: Surgery

## 2015-02-17 ENCOUNTER — Encounter: Payer: Medicare Other | Attending: Surgery | Admitting: Surgery

## 2015-02-17 DIAGNOSIS — I10 Essential (primary) hypertension: Secondary | ICD-10-CM | POA: Diagnosis not present

## 2015-02-17 DIAGNOSIS — I739 Peripheral vascular disease, unspecified: Secondary | ICD-10-CM | POA: Insufficient documentation

## 2015-02-17 DIAGNOSIS — X58XXXA Exposure to other specified factors, initial encounter: Secondary | ICD-10-CM | POA: Insufficient documentation

## 2015-02-17 DIAGNOSIS — L89154 Pressure ulcer of sacral region, stage 4: Secondary | ICD-10-CM | POA: Diagnosis not present

## 2015-02-17 DIAGNOSIS — C20 Malignant neoplasm of rectum: Secondary | ICD-10-CM | POA: Diagnosis not present

## 2015-02-17 DIAGNOSIS — E11622 Type 2 diabetes mellitus with other skin ulcer: Secondary | ICD-10-CM | POA: Insufficient documentation

## 2015-02-17 DIAGNOSIS — T8132XA Disruption of internal operation (surgical) wound, not elsewhere classified, initial encounter: Secondary | ICD-10-CM | POA: Diagnosis not present

## 2015-02-17 NOTE — Progress Notes (Addendum)
[name], [name] ([id number]) Visit Report for 02/17/2015 Chief Complaint Document Details Patient Name: Darryl Hooper, Darryl Hooper Date of Service: 02/17/2015 8:45 AM Medical Record Number: [id number] Patient Account Number: [id number] Date of Birth/Sex: 1930/03/01 (81 y.o. Male) Treating RN: Montey Hora Primary Care Physician: Dicky Doe Other Clinician: Referring Physician: Dicky Doe Treating Physician/Extender: Frann Rider in Treatment: 0 Information Obtained from: Patient Chief Complaint Patient presents to the wound care center for a consult due non healing wound. the patient has a large sacral and perineal wound which she's had for about 2 months Electronic Signature(s) Signed: 02/17/2015 10:18:26 AM By: Christin Fudge MD, FACS Entered By: Christin Fudge on 02/17/2015 10:18:26 Darryl Hooper ([id number]) -------------------------------------------------------------------------------- HPI Details Patient Name: Darryl Hooper Date of Service: 02/17/2015 8:45 AM Medical Record Number: [id number] Patient Account Number: [id number] Date of Birth/Sex: Nov 19, 1929 (81 y.o. Male) Treating RN: Montey Hora Primary Care Physician: Dicky Doe Other Clinician: Referring Physician: Dicky Doe Treating Physician/Extender: Frann Rider in Treatment: 0 History of Present Illness Location: sacral and perianal region Quality: Patient reports Tenderness to the affected area(s). Severity: Patient states wound are getting worse. Duration: Patient has had the wound for > 3 months prior to seeking treatment at the wound center Timing: Pain in wound is Intermittent (comes and goes Context: The wound occurred when the patient had a APR done in July and then the wound opened out and he had a sacral decubitus ulcer and abscess which needed debridement in the OR Modifying Factors: Other treatment(s) tried include: 3 surgeries postoperatively for the decubitus ulcer  and sacral and perineal abscess Associated Signs and Symptoms: Patient reports having foul odor. HPI Description: 81 year old gentleman who has recently been treated for rectal cancer with a colostomy and had several postoperative complications including a sacral decubitus ulcer. No further chemotherapy was recommended. his comorbidities include hypertension, peripheral vascular disease, diabetes mellitus, prostate cancer, rectal cancer, history of pulmonary embolus. He is status post prostatectomy,abdominal perineal bowel resection in July 2016, cystoscopy,debridement of his sacral decubitus ulcer and abscess with application for wound VAC done in July in 2016. he was recently admitted by his surgeon Dr. [name] between September 3 and 02/07/2015 for a presacral abscess, sacral decubitus ulcer stage IV, failure to thrive and application of a wound VAC. he is now a DO NOT RESUSCITATE. he is at [facility] and has a wound VAC application and is gradually improving with better nutritional support and supportive care. Electronic Signature(s) Signed: 02/17/2015 10:20:02 AM By: Christin Fudge MD, FACS Previous Signature: 02/17/2015 9:21:25 AM Version By: Christin Fudge MD, FACS Entered By: Christin Fudge on 02/17/2015 10:20:02 Darryl Hooper ([id number]) -------------------------------------------------------------------------------- Physical Exam Details Patient Name: Darryl Hooper Date of Service: 02/17/2015 8:45 AM Medical Record Number: [id number] Patient Account Number: [id number] Date of Birth/Sex: 12/07/1929 (81 y.o. Male) Treating RN: Montey Hora Primary Care Physician: Dicky Doe Other Clinician: Referring Physician: Dicky Doe Treating Physician/Extender: Frann Rider in Treatment: 0 Eyes Nonicteric. Reactive to light. Ears, Nose, Mouth, and Throat Lips, teeth, and gums WNL.Marland Kitchen Moist mucosa without lesions . Neck supple and nontender. No palpable  supraclavicular or cervical adenopathy. Normal sized without goiter. Respiratory WNL. No retractions.. Breath sounds WNL, No rubs, rales, rhonchi, or wheeze.. Cardiovascular Heart rhythm and rate regular, no murmur or gallop.. Pedal Pulses WNL. No clubbing, cyanosis or edema. Gastrointestinal (GI) Abdomen without masses or tenderness.. No liver or spleen enlargement or tenderness.. Lymphatic No adneopathy. No adenopathy. No  adenopathy. Musculoskeletal Adexa without tenderness or enlargement.. Digits and nails w/o clubbing, cyanosis, infection, petechiae, ischemia, or inflammatory conditions.. Integumentary (Hair, Skin) No suspicious lesions. No crepitus or fluctuance. No peri-wound warmth or erythema. No masses.Marland Kitchen Psychiatric Judgement and insight Intact.. No evidence of depression, anxiety, or agitation.. Notes Has a large sacral and perineal decubitus ulcer probing down to bone and having extensive amount of debris and slough which is more in the superior aspect. The inferior aspect has clean granulation tissue. Electronic Signature(s) Signed: 02/17/2015 10:20:53 AM By: Christin Fudge MD, FACS Entered By: Christin Fudge on 02/17/2015 10:20:53 Darryl Hooper ([id number]) -------------------------------------------------------------------------------- Physician Orders Details Patient Name: Darryl Hooper Date of Service: 02/17/2015 8:45 AM Medical Record Number: [id number] Patient Account Number: [id number] Date of Birth/Sex: 09/08/1929 (81 y.o. Male) Treating RN: Cornell Barman Primary Care Physician: Dicky Doe Other Clinician: Referring Physician: Dicky Doe Treating Physician/Extender: Frann Rider in Treatment: 0 Verbal / Phone Orders: Yes Clinician: Cornell Barman Read Back and Verified: Yes Diagnosis Coding Wound Cleansing Wound #1 Midline Sacrum o Clean wound with Normal Saline. Anesthetic Wound #1 Midline Sacrum o Topical Lidocaine 4% cream applied to  wound bed prior to debridement Skin Barriers/Peri-Wound Care Wound #1 Midline Sacrum o Skin Prep Primary Wound Dressing Wound #1 Midline Sacrum o Santyl Ointment - to be applied under wound vac Secondary Dressing Wound #1 Midline Sacrum o ABD pad - in clinic today, SNF to reapply wound vac. Dressing Change Frequency Wound #1 Midline Sacrum o Change Dressing Monday, Wednesday, Friday - NPWT Follow-up Appointments Wound #1 Midline Sacrum o Return Appointment in 1 week. Negative Pressure Wound Therapy Wound #1 Midline Sacrum o Wound VAC settings at 125/130 mmHg continuous pressure. Use BLACK/GREEN foam to wound cavity. Use WHITE foam to fill any tunnel/s and/or undermining. Change VAC dressing 2 X WEEK. Change canister as indicated when full. Nurse may titrate settings and frequency of dressing changes as clinically indicated. Darryl Hooper, Darryl Hooper ([id number]) o Home Health Nurse may d/c VAC for s/s of increased infection, significant wound regression, or uncontrolled drainage. [place] at [phone]. o Apply contact layer over base of wound. - Contact over bone o Number of foam/gauze pieces used in the dressing = Electronic Signature(s) Signed: 02/17/2015 3:58:37 PM By: Christin Fudge MD, FACS Signed: 02/17/2015 4:49:24 PM By: Gretta Cool RN, BSN, Kim RN, BSN Entered By: Gretta Cool, RN, BSN, Kim on 02/17/2015 11:11:11 Darryl Hooper ([id number]) -------------------------------------------------------------------------------- Problem List Details Patient Name: Darryl Hooper Date of Service: 02/17/2015 8:45 AM Medical Record Number: [id number] Patient Account Number: [id number] Date of Birth/Sex: 24-Jul-1929 (81 y.o. Male) Treating RN: Montey Hora Primary Care Physician: Dicky Doe Other Clinician: Referring Physician: Dicky Doe Treating Physician/Extender: Frann Rider in Treatment: 0 Active Problems ICD-10 Encounter Code Description  Active Date Diagnosis E11.622 Type 2 diabetes mellitus with other skin ulcer 02/17/2015 Yes T81.32XA Disruption of internal operation (surgical) wound, not 02/17/2015 Yes elsewhere classified, initial encounter L89.154 Pressure ulcer of sacral region, stage 4 02/17/2015 Yes C20 Malignant neoplasm of rectum 02/17/2015 Yes Inactive Problems Resolved Problems Electronic Signature(s) Signed: 02/17/2015 10:18:02 AM By: Christin Fudge MD, FACS Previous Signature: 02/17/2015 10:17:54 AM Version By: Christin Fudge MD, FACS Entered By: Christin Fudge on 02/17/2015 10:18:02 Darryl Hooper ([id number]) -------------------------------------------------------------------------------- Progress Note Details Patient Name: Darryl Hooper Date of Service: 02/17/2015 8:45 AM Medical Record Number: [id number] Patient Account Number: [id number] Date of Birth/Sex: Apr 14, 1930 (81 y.o. Male) Treating RN: Montey Hora Primary Care Physician: Luan Pulling  Valinda Party Other Clinician: Referring Physician: Dicky Doe Treating Physician/Extender: Frann Rider in Treatment: 0 Subjective Chief Complaint Information obtained from Patient Patient presents to the wound care center for a consult due non healing wound. the patient has a large sacral and perineal wound which she's had for about 2 months History of Present Illness (HPI) The following HPI elements were documented for the patient's wound: Location: sacral and perianal region Quality: Patient reports Tenderness to the affected area(s). Severity: Patient states wound are getting worse. Duration: Patient has had the wound for > 3 months prior to seeking treatment at the wound center Timing: Pain in wound is Intermittent (comes and goes Context: The wound occurred when the patient had a APR done in July and then the wound opened out and he had a sacral decubitus ulcer and abscess which needed debridement in the OR Modifying Factors: Other treatment(s) tried  include: 3 surgeries postoperatively for the decubitus ulcer and sacral and perineal abscess Associated Signs and Symptoms: Patient reports having foul odor. 81 year old gentleman who has recently been treated for rectal cancer with a colostomy and had several postoperative complications including a sacral decubitus ulcer. No further chemotherapy was recommended. his comorbidities include hypertension, peripheral vascular disease, diabetes mellitus, prostate cancer, rectal cancer, history of pulmonary embolus. He is status post prostatectomy,abdominal perineal bowel resection in July 2016, cystoscopy,debridement of his sacral decubitus ulcer and abscess with application for wound VAC done in July in 2016. he was recently admitted by his surgeon Dr. [name] between September 3 and 02/07/2015 for a presacral abscess, sacral decubitus ulcer stage IV, failure to thrive and application of a wound VAC. he is now a DO NOT RESUSCITATE. he is at [facility] and has a wound VAC application and is gradually improving with better nutritional support and supportive care. Wound History Patient presents with 1 open wound that has been present for approximately July 14. Patient has been treating wound in the following manner: NPWT. Laboratory tests have not been performed in the last month. Patient reportedly has not tested positive for an antibiotic resistant organism. Patient reportedly has not tested positive for osteomyelitis. Patient reportedly has not had testing performed to evaluate circulation in the legs. Darryl Hooper, Darryl Hooper ([id number]) Patient History Information obtained from Caregiver. Allergies No Known Allergies Family History Cancer - Father, Heart Disease - Mother, Father, Hypertension - Mother, Father, No family history of Diabetes, Hereditary Spherocytosis, Kidney Disease, Lung Disease, Seizures, Stroke, Thyroid Problems, Tuberculosis. Social History Never smoker, Marital Status -  Married, Alcohol Use - Never, Drug Use - No History, Caffeine Use - Never. Medical History Cardiovascular Patient has history of Hypertension, Peripheral Venous Disease Endocrine Patient has history of Type II Diabetes Musculoskeletal Patient has history of Osteoarthritis Neurologic Patient has history of Neuropathy Oncologic Patient has history of Received Radiation - years ago for prostate cancer Denies history of Received Chemotherapy Patient is treated with Oral Agents. Blood sugar is tested. Hospitalization/Surgery History - 12/12/2014, [facility], colostomy. Medical And Surgical History Notes Cardiovascular hyperlipidemia Gastrointestinal colostomy Review of Systems (ROS) Constitutional Symptoms (General Health) The patient has no complaints or symptoms. Eyes Complains or has symptoms of Glasses / Contacts - glasses. Ear/Nose/Mouth/Throat The patient has no complaints or symptoms. Hematologic/Lymphatic The patient has no complaints or symptoms. Respiratory The patient has no complaints or symptoms. Cardiovascular Darryl Hooper, Darryl Hooper. ([id number]) The patient has no complaints or symptoms. Genitourinary Complains or has symptoms of Kidney failure/ Dialysis - CKD. Immunological The patient has no  complaints or symptoms. Integumentary (Skin) The patient has no complaints or symptoms. Musculoskeletal Complains or has symptoms of Muscle Weakness. Medications vitamin C oral 1 1 tablet oral Tylenol 325 mg tablet oral 2 2 tablet oral hydrocodone 5 mg-acetaminophen 325 mg tablet oral tablet oral gabapentin 300 mg capsule oral 1 1 capsule oral glimepiride 4 mg tablet oral 1 1 tablet oral losartan 100 mg tablet oral 1 1 tablet oral amlodipine 2.5 mg tablet oral 1 1 tablet oral ferrous sulfate 325 mg (65 mg iron) tablet oral 1 1 tablet oral Senna-S 8.6 mg-50 mg tablet oral 1 1 tablet oral multivitamin tablet oral 1 1 tablet oral zinc sulfate 220 mg (50 mg) capsule oral 1 1 capsule  oral Objective Constitutional Vitals Time Taken: 9:08 AM, Height: 74 in, Source: Stated, Weight: 225 lbs, Source: Stated, BMI: 28.9, Temperature: 98.3 F, Pulse: 80 bpm, Respiratory Rate: 16 breaths/min, Blood Pressure: 104/50 mmHg. Eyes Nonicteric. Reactive to light. Ears, Nose, Mouth, and Throat Lips, teeth, and gums WNL.Marland Kitchen Moist mucosa without lesions . Neck supple and nontender. No palpable supraclavicular or cervical adenopathy. Normal sized without goiter. Respiratory WNL. No retractions.. Breath sounds WNL, No rubs, rales, rhonchi, or wheeze.Marland Kitchen Darryl Hooper, Darryl Dequann J. ([id number]) Cardiovascular Heart rhythm and rate regular, no murmur or gallop.. Pedal Pulses WNL. No clubbing, cyanosis or edema. Gastrointestinal (GI) Abdomen without masses or tenderness.. No liver or spleen enlargement or tenderness.. Lymphatic No adneopathy. No adenopathy. No adenopathy. Musculoskeletal Adexa without tenderness or enlargement.. Digits and nails w/o clubbing, cyanosis, infection, petechiae, ischemia, or inflammatory conditions.Marland Kitchen Psychiatric Judgement and insight Intact.. No evidence of depression, anxiety, or agitation.. General Notes: Has a large sacral and perineal decubitus ulcer probing down to bone and having extensive amount of debris and slough which is more in the superior aspect. The inferior aspect has clean granulation tissue. Integumentary (Hair, Skin) No suspicious lesions. No crepitus or fluctuance. No peri-wound warmth or erythema. No masses.. Wound #1 status is Open. Original cause of wound was Pressure Injury. The wound is located on the Midline Sacrum. The wound measures 14.8cm length x 6.5cm width x 5.5cm depth; 75.555cm^2 area and 415.554cm^3 volume. There is bone, muscle, and fat exposed. There is no tunneling or undermining noted. There is a large amount of serous drainage noted. The wound margin is flat and intact. There is small (1-33%) red granulation within the wound bed.  There is a large (67-100%) amount of necrotic tissue within the wound bed including Eschar. The periwound skin appearance did not exhibit: Callus, Crepitus, Excoriation, Fluctuance, Friable, Induration, Localized Edema, Rash, Scarring, Dry/Scaly, Maceration, Moist, Atrophie Blanche, Cyanosis, Ecchymosis, Hemosiderin Staining, Mottled, Pallor, Rubor, Erythema. Periwound temperature was noted as No Abnormality. The periwound has tenderness on palpation. Assessment Active Problems ICD-10 E11.622 - Type 2 diabetes mellitus with other skin ulcer T81.32XA - Disruption of internal operation (surgical) wound, not elsewhere classified, initial encounter L89.154 - Pressure ulcer of sacral region, stage 4 C20 - Malignant neoplasm of rectum Darryl Hooper, Darryl J. ([id number]) This moribund elderly gentleman with a large perineal and sacral wound will need Santyl dressing and an appropriate contact layer on the bone which is mainly the sacrum. He will then have the application of the wound VAC and this is to be changed 3 times a week. I have also discussed nutrition and vitamin supplements and offloading as much as possible and appropriate and mattress. He is encouraged to keep his appointment with Dr. [name] so that further debridement could be done appropriately. The patient's daughter  and son at the bedside and all questions have been answered and they will bring him back next week. Plan Wound Cleansing: Wound #1 Midline Sacrum: Clean wound with Normal Saline. Anesthetic: Wound #1 Midline Sacrum: Topical Lidocaine 4% cream applied to wound bed prior to debridement Skin Barriers/Peri-Wound Care: Wound #1 Midline Sacrum: Skin Prep Primary Wound Dressing: Wound #1 Midline Sacrum: Santyl Ointment - to be applied under wound vac Secondary Dressing: Wound #1 Midline Sacrum: ABD pad - in clinic today, SNF to reapply wound vac. Dressing Change Frequency: Wound #1 Midline Sacrum: Change Dressing  Monday, Wednesday, Friday - NPWT Follow-up Appointments: Wound #1 Midline Sacrum: Return Appointment in 1 week. Negative Pressure Wound Therapy: Wound #1 Midline Sacrum: Wound VAC settings at 125/130 mmHg continuous pressure. Use BLACK/GREEN foam to wound cavity. Use WHITE foam to fill any tunnel/s and/or undermining. Change VAC dressing 2 X WEEK. Change canister as indicated when full. Nurse may titrate settings and frequency of dressing changes as clinically indicated. Home Health Nurse may d/c VAC for s/s of increased infection, significant wound regression, or uncontrolled drainage. [place] at [phone]. Apply contact layer over base of wound. - Contact over bone Number of foam/gauze pieces used in the dressing = Darryl Hooper, Darryl J. ([id number]) This moribund elderly gentleman with a large perineal and sacral wound will need Santyl dressing and an appropriate contact layer on the bone which is mainly the sacrum. He will then have the application of the wound VAC and this is to be changed 3 times a week. I have also discussed nutrition and vitamin supplements and offloading as much as possible and appropriate and mattress. He is encouraged to keep his appointment with Dr. [name] so that further debridement could be done appropriately. The patient's daughter and son at the bedside and all questions have been answered and they will bring him back next week. Electronic Signature(s) Signed: 02/17/2015 3:59:59 PM By: Christin Fudge MD, FACS Previous Signature: 02/17/2015 10:22:35 AM Version By: Christin Fudge MD, FACS Entered By: Christin Fudge on 02/17/2015 15:59:59 Darryl Hooper ([id number]) -------------------------------------------------------------------------------- ROS/PFSH Details Patient Name: Darryl Hooper Date of Service: 02/17/2015 8:45 AM Medical Record Number: [id number] Patient Account Number: [id number] Date of Birth/Sex: [birth year] (81 y.o.  Male) Treating RN: Montey Hora Primary Care Physician: Dicky Doe Other Clinician: Referring Physician: Dicky Doe Treating Physician/Extender: Frann Rider in Treatment: 0 Information Obtained From Caregiver Wound History Do you currently have one or more open woundso Yes How many open wounds do you currently haveo 1 Approximately how long have you had your woundso July 14 How have you been treating your wound(s) until nowo NPWT Has your wound(s) ever healed and then re-openedo No Have you had any lab work done in the past montho No Have you tested positive for an antibiotic resistant organism (MRSA, VRE)o No Have you tested positive for osteomyelitis (bone infection)o No Have you had any tests for circulation on your legso No Eyes Complaints and Symptoms: Positive for: Glasses / Contacts - glasses Genitourinary Complaints and Symptoms: Positive for: Kidney failure/ Dialysis - CKD Musculoskeletal Complaints and Symptoms: Positive for: Muscle Weakness Medical History: Positive for: Osteoarthritis Constitutional Symptoms (General Health) Complaints and Symptoms: No Complaints or Symptoms Ear/Nose/Mouth/Throat Complaints and Symptoms: No Complaints or Symptoms Hematologic/Lymphatic Darryl Hooper, Darryl Hooper ([id number]) Complaints and Symptoms: No Complaints or Symptoms Respiratory Complaints and Symptoms: No Complaints or Symptoms Cardiovascular Complaints and Symptoms: No Complaints or Symptoms Medical History: Positive for: Hypertension; Peripheral Venous Disease  Past Medical History Notes: hyperlipidemia Gastrointestinal Medical History: Past Medical History Notes: colostomy Endocrine Medical History: Positive for: Type II Diabetes Time with diabetes: DMII Treated with: Oral agents Blood sugar tested every day: Yes Tested : Immunological Complaints and Symptoms: No Complaints or Symptoms Integumentary (Skin) Complaints and Symptoms: No  Complaints or Symptoms Neurologic Medical History: Positive for: Neuropathy Oncologic Medical History: Positive for: Received Radiation - years ago for prostate cancer Darryl Hooper, Darryl Hooper ([id number]) Negative for: Received Chemotherapy Hospitalization / Surgery History Name of Hospital Purpose of Hospitalization/Surgery Date [facility] colostomy 12/12/2014 Family and Social History Cancer: Yes - Father; Diabetes: No; Heart Disease: Yes - Mother, Father; Hereditary Spherocytosis: No; Hypertension: Yes - Mother, Father; Kidney Disease: No; Lung Disease: No; Seizures: No; Stroke: No; Thyroid Problems: No; Tuberculosis: No; Never smoker; Marital Status - Married; Alcohol Use: Never; Drug Use: No History; Caffeine Use: Never; Financial Concerns: No; Food, Clothing or Shelter Needs: No; Support System Lacking: No; Transportation Concerns: No; Medical Power of Attorney: Yes - Avanell Shackleton (Copy provided) Physician Affirmation I have reviewed and agree with the above information. Electronic Signature(s) Signed: 02/17/2015 9:33:36 AM By: Christin Fudge MD, FACS Signed: 02/17/2015 3:26:09 PM By: Montey Hora Entered By: Christin Fudge on 02/17/2015 09:33:36 Darryl Hooper ([id number]) -------------------------------------------------------------------------------- SuperBill Details Patient Name: Darryl Hooper Date of Service: 02/17/2015 Medical Record Number: [id number] Patient Account Number: [id number] Date of Birth/Sex: [birth year] (81 y.o. Male) Treating RN: Montey Hora Primary Care Physician: Dicky Doe Other Clinician: Referring Physician: Dicky Doe Treating Physician/Extender: Frann Rider in Treatment: 0 Diagnosis Coding ICD-10 Codes Code Description [phone] Type 2 diabetes mellitus with other skin ulcer Disruption of internal operation (surgical) wound, not elsewhere classified, initial T81.32XA encounter L89.154 Pressure ulcer of sacral region, stage 4 C20  Malignant neoplasm of rectum Facility Procedures CPT4 Code: [id number] Description: 99214 - WOUND CARE VISIT-LEV 4 EST PT Modifier: Quantity: 1 Physician Procedures CPT4: Description Modifier Quantity Code [id number] - WC PHYS LEVEL 4 - NEW PT 1 ICD-10 Description Diagnosis E11.622 Type 2 diabetes mellitus with other skin ulcer T81.32XA Disruption of internal operation (surgical) wound, not elsewhere classified,  initial encounter L89.154 Pressure ulcer of sacral region, stage 4 C20 Malignant neoplasm of rectum Electronic Signature(s) Signed: 02/17/2015 10:22:52 AM By: Christin Fudge MD, FACS Entered By: Christin Fudge on 02/17/2015 10:22:52

## 2015-02-17 NOTE — Progress Notes (Signed)
JAEDIN, REGINA ([id number]) Visit Report for 02/17/2015 Abuse/Suicide Risk Screen Details Patient Name: DHIREN, AZIMI Date of Service: 02/17/2015 8:45 AM Medical Record Number: [id number] Patient Account Number: [id number] Date of Birth/Sex: 08/22/1929 (81 y.o. Male) Treating RN: Montey Hora Primary Care Physician: Dicky Doe Other Clinician: Referring Physician: Dicky Doe Treating Physician/Extender: Frann Rider in Treatment: 0 Abuse/Suicide Risk Screen Items Answer ABUSE/SUICIDE RISK SCREEN: Has anyone close to you tried to hurt or harm you recentlyo No Do you feel uncomfortable with anyone in your familyo No Has anyone forced you do things that you didnot want to doo No Do you have any thoughts of harming yourselfo No Patient displays signs or symptoms of abuse and/or neglect. No Electronic Signature(s) Signed: 02/17/2015 3:26:09 PM By: Montey Hora Entered By: Montey Hora on 02/17/2015 09:26:30 Bennett Scrape ([id number]) -------------------------------------------------------------------------------- Activities of Daily Living Details Patient Name: Bennett Scrape Date of Service: 02/17/2015 8:45 AM Medical Record Number: [id number] Patient Account Number: [id number] Date of Birth/Sex: June 13, 1929 (81 y.o. Male) Treating RN: Montey Hora Primary Care Physician: Dicky Doe Other Clinician: Referring Physician: Dicky Doe Treating Physician/Extender: Frann Rider in Treatment: 0 Activities of Daily Living Items Answer Activities of Daily Living (Please select one for each item) Drive Automobile Not Able Take Medications Need Assistance Use Telephone Need Assistance Care for Appearance Need Assistance Use Toilet Need Assistance Bath / Shower Need Assistance Dress Self Need Assistance Feed Self Completely Able Walk Not Able Get In / Out Bed Need Assistance Housework Need Assistance Prepare Meals Need Assistance Handle  Money Completely Able Shop for Self Need Assistance Electronic Signature(s) Signed: 02/17/2015 3:26:09 PM By: Montey Hora Entered By: Montey Hora on 02/17/2015 09:27:01 Bennett Scrape ([id number]) -------------------------------------------------------------------------------- Education Assessment Details Patient Name: Bennett Scrape Date of Service: 02/17/2015 8:45 AM Medical Record Number: [id number] Patient Account Number: [id number] Date of Birth/Sex: 1929/08/27 (81 y.o. Male) Treating RN: Montey Hora Primary Care Physician: Dicky Doe Other Clinician: Referring Physician: Dicky Doe Treating Physician/Extender: Frann Rider in Treatment: 0 Primary Learner Assessed: Patient Learning Preferences/Education Level/Primary Language Learning Preference: Explanation, Demonstration Highest Education Level: High School Preferred Language: English Cognitive Barrier Assessment/Beliefs Language Barrier: No Translator Needed: No Memory Deficit: No Emotional Barrier: No Cultural/Religious Beliefs Affecting Medical No Care: Physical Barrier Assessment Impaired Vision: No Impaired Hearing: Yes HOH - lost hearing aids Decreased Hand dexterity: No Knowledge/Comprehension Assessment Knowledge Level: Medium Comprehension Level: Medium Ability to understand written Medium instructions: Ability to understand verbal Medium instructions: Motivation Assessment Anxiety Level: Calm Cooperation: Cooperative Education Importance: Acknowledges Need Interest in Health Problems: Uninterested Perception: Coherent Willingness to Engage in Self- Medium Management Activities: Readiness to Engage in Self- Medium Management Activities: Electronic Signature(s) KEEDAN, SAMPLE ([id number]) Signed: 02/17/2015 3:26:09 PM By: Montey Hora Entered By: Montey Hora on 02/17/2015 09:27:44 Bennett Scrape  ([id number]) -------------------------------------------------------------------------------- Fall Risk Assessment Details Patient Name: Bennett Scrape Date of Service: 02/17/2015 8:45 AM Medical Record Number: [id number] Patient Account Number: [id number] Date of Birth/Sex: 1930/05/24 (81 y.o. Male) Treating RN: Montey Hora Primary Care Physician: Dicky Doe Other Clinician: Referring Physician: Dicky Doe Treating Physician/Extender: Frann Rider in Treatment: 0 Fall Risk Assessment Items FALL RISK ASSESSMENT: History of falling - immediate or within 3 months 0 No Secondary diagnosis 0 No Ambulatory aid None/bed rest/wheelchair/nurse 0 Yes Crutches/cane/walker 0 No Furniture 0 No IV Access/Saline Lock 0 No Gait/Training Normal/bed rest/immobile 0 No Weak 10 Yes Impaired 0 No Mental  Status Oriented to own ability 0 Yes Electronic Signature(s) Signed: 02/17/2015 3:26:09 PM By: Montey Hora Entered By: Montey Hora on 02/17/2015 09:27:56 Bennett Scrape ([id number]) -------------------------------------------------------------------------------- Foot Assessment Details Patient Name: Bennett Scrape Date of Service: 02/17/2015 8:45 AM Medical Record Number: [id number] Patient Account Number: [id number] Date of Birth/Sex: [birth year] (81 y.o. Male) Treating RN: Montey Hora Primary Care Physician: Dicky Doe Other Clinician: Referring Physician: Dicky Doe Treating Physician/Extender: Frann Rider in Treatment: 0 Foot Assessment Items Site Locations + = Sensation present, - = Sensation absent, C = Callus, U = Ulcer R = Redness, W = Warmth, M = Maceration, PU = Pre-ulcerative lesion F = Fissure, S = Swelling, D = Dryness Assessment Right: Left: Other Deformity: No No Prior Foot Ulcer: No No Prior Amputation: No No Charcot Joint: No No Ambulatory Status: Gait: Electronic Signature(s) Signed: 02/17/2015 3:26:09 PM By: Montey Hora Entered By: Montey Hora on 02/17/2015 09:43:34 Blish, Parks Lenna Sciara ([id number]) -------------------------------------------------------------------------------- Nutrition Risk Assessment Details Patient Name: Bennett Scrape Date of Service: 02/17/2015 8:45 AM Medical Record Number: [id number] Patient Account Number: [id number] Date of Birth/Sex: 05/19/1930 (81 y.o. Male) Treating RN: Montey Hora Primary Care Physician: Dicky Doe Other Clinician: Referring Physician: Dicky Doe Treating Physician/Extender: Frann Rider in Treatment: 0 Height (in): 74 Weight (lbs): 225 Body Mass Index (BMI): 28.9 Nutrition Risk Assessment Items NUTRITION RISK SCREEN: I have an illness or condition that made me change the kind and/or 0 No amount of food I eat I eat fewer than two meals per day 0 No I eat few fruits and vegetables, or milk products 0 No I have three or more drinks of beer, liquor or wine almost every day 0 No I have tooth or mouth problems that make it hard for me to eat 0 No I don't always have enough money to buy the food I need 0 No I eat alone most of the time 0 No I take three or more different prescribed or over-the-counter drugs a 1 Yes day Without wanting to, I have lost or gained 10 pounds in the last six 0 No months I am not always physically able to shop, cook and/or feed myself 0 No Nutrition Protocols Good Risk Protocol 0 No interventions needed Moderate Risk Protocol Electronic Signature(s) Signed: 02/17/2015 3:26:09 PM By: Montey Hora Entered By: Montey Hora on 02/17/2015 88:82:80
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
## 2015-02-21 ENCOUNTER — Other Ambulatory Visit: Payer: Self-pay | Admitting: *Deleted

## 2015-02-21 NOTE — Patient Outreach (Signed)
  [facility]) Care Management  02/21/2015  Yoniel Arkwright. 1930/01/08 [id number] 9/21 Telephone screening call to the home of Mr. Tennyson Wacha , spoke with his wife Eudell Mcphee she was able to answer HIPPA questions appropriately. I explained [facility] services with Mrs. Pellecchia and she is agreeable to services, but states that her husband is currently still in [facility], but it is their hope that he will be able to return home. I discussed with her that we will have RN care management as well as Education officer, museum that will follow his progress and assist as needed. Mrs.Dax is looking forward to reading more information about [facility] in the welcome packet.  Plan  I Will send Lurline Del a inbasket message that Mrs. Jafari is agreeable to [facility] services for her husband, and also send a referral for social work involvement.  Joylene Draft, RN, [place] Care Management [phone]

## 2015-02-24 ENCOUNTER — Encounter: Payer: Self-pay | Admitting: *Deleted

## 2015-02-24 ENCOUNTER — Encounter: Payer: Medicare Other | Admitting: Surgery

## 2015-02-24 DIAGNOSIS — E11622 Type 2 diabetes mellitus with other skin ulcer: Secondary | ICD-10-CM | POA: Diagnosis not present

## 2015-02-24 DIAGNOSIS — T8132XA Disruption of internal operation (surgical) wound, not elsewhere classified, initial encounter: Secondary | ICD-10-CM | POA: Diagnosis not present

## 2015-02-24 DIAGNOSIS — I10 Essential (primary) hypertension: Secondary | ICD-10-CM | POA: Diagnosis not present

## 2015-02-24 DIAGNOSIS — I739 Peripheral vascular disease, unspecified: Secondary | ICD-10-CM | POA: Diagnosis not present

## 2015-02-24 DIAGNOSIS — L89154 Pressure ulcer of sacral region, stage 4: Secondary | ICD-10-CM | POA: Diagnosis not present

## 2015-02-24 DIAGNOSIS — C20 Malignant neoplasm of rectum: Secondary | ICD-10-CM | POA: Diagnosis not present
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
## 2015-02-26 NOTE — Progress Notes (Signed)
HALIM, SURRETTE ([id number]) Visit Report for 02/24/2015 Chief Complaint Document Details Patient Name: Darryl Hooper, Darryl Hooper Date of Service: 02/24/2015 1:00 PM Medical Record Number: [id number] Patient Account Number: [id number] Date of Birth/Sex: 04/05/1930 (81 y.o. Male) Treating RN: Montey Hora Primary Care Physician: Dicky Doe Other Clinician: Referring Physician: Dicky Doe Treating Physician/Extender: Frann Rider in Treatment: 1 Information Obtained from: Patient Chief Complaint Patient presents to the wound care center for a consult due non healing wound. the patient has a large sacral and perineal wound which she's had for about 2 months Electronic Signature(s) Signed: 02/24/2015 1:57:38 PM By: Christin Fudge MD, FACS Entered By: Christin Fudge on 02/24/2015 13:57:38 Bennett Scrape ([id number]) -------------------------------------------------------------------------------- Debridement Details Patient Name: Bennett Scrape Date of Service: 02/24/2015 1:00 PM Medical Record Number: [id number] Patient Account Number: [id number] Date of Birth/Sex: 31-Jan-1930 (81 y.o. Male) Treating RN: Montey Hora Primary Care Physician: Dicky Doe Other Clinician: Referring Physician: Dicky Doe Treating Physician/Extender: Frann Rider in Treatment: 1 Debridement Performed for Wound #1 Midline Sacrum Assessment: Performed By: Physician Pat Patrick., MD Debridement: Debridement Pre-procedure Yes Verification/Time Out Taken: Start Time: 13:45 Pain Control: Other : lidocaine 4% Level: Skin/Subcutaneous Tissue Total Area Debrided (L x 5 (cm) x 6 (cm) = 30 (cm) W): Tissue and other Viable, Non-Viable, Eschar, Exudate, Fat, Fibrin/Slough, Subcutaneous material debrided: Instrument: Forceps, Scissors Bleeding: Minimum Hemostasis Achieved: Pressure End Time: 13:55 Procedural Pain: 0 Post Procedural Pain: 0 Response to Treatment: Procedure was  tolerated well Post Debridement Measurements of Total Wound Length: (cm) 13.5 Stage: Category/Stage IV Width: (cm) 6.5 Depth: (cm) 6.5 Volume: (cm) 447.971 Post Procedure Diagnosis Same as Pre-procedure Electronic Signature(s) Signed: 02/24/2015 1:57:28 PM By: Christin Fudge MD, FACS Signed: 02/24/2015 5:00:42 PM By: Montey Hora Entered By: Christin Fudge on 02/24/2015 13:57:28 Bennett Scrape ([id number]) -------------------------------------------------------------------------------- HPI Details Patient Name: Bennett Scrape Date of Service: 02/24/2015 1:00 PM Medical Record Number: [id number] Patient Account Number: [id number] Date of Birth/Sex: 1930/02/17 (81 y.o. Male) Treating RN: Montey Hora Primary Care Physician: Dicky Doe Other Clinician: Referring Physician: Dicky Doe Treating Physician/Extender: Frann Rider in Treatment: 1 History of Present Illness Location: sacral and perianal region Quality: Patient reports Tenderness to the affected area(s). Severity: Patient states wound are getting worse. Duration: Patient has had the wound for > 3 months prior to seeking treatment at the wound center Timing: Pain in wound is Intermittent (comes and goes Context: The wound occurred when the patient had a APR done in July and then the wound opened out and he had a sacral decubitus ulcer and abscess which needed debridement in the OR Modifying Factors: Other treatment(s) tried include: 3 surgeries postoperatively for the decubitus ulcer and sacral and perineal abscess Associated Signs and Symptoms: Patient reports having foul odor. HPI Description: 81 year old gentleman who has recently been treated for rectal cancer with a colostomy and had several postoperative complications including a sacral decubitus ulcer. No further chemotherapy was recommended. his comorbidities include hypertension, peripheral vascular disease, diabetes mellitus, prostate  cancer, rectal cancer, history of pulmonary embolus. He is status post prostatectomy,abdominal perineal bowel resection in July 2016, cystoscopy,debridement of his sacral decubitus ulcer and abscess with application for wound VAC done in July in 2016. he was recently admitted by his surgeon Dr. [name] between September 3 and 02/07/2015 for a presacral abscess, sacral decubitus ulcer stage IV, failure to thrive and application of a wound VAC. he is now a DO NOT RESUSCITATE.  he is at [facility] and has a wound VAC application and is gradually improving with better nutritional support and supportive care. Electronic Signature(s) Signed: 02/24/2015 1:57:45 PM By: Christin Fudge MD, FACS Entered By: Christin Fudge on 02/24/2015 13:57:45 Bennett Scrape ([id number]) -------------------------------------------------------------------------------- Physical Exam Details Patient Name: Bennett Scrape Date of Service: 02/24/2015 1:00 PM Medical Record Number: [id number] Patient Account Number: [id number] Date of Birth/Sex: [birth year] (81 y.o. Male) Treating RN: Montey Hora Primary Care Physician: Dicky Doe Other Clinician: Referring Physician: Dicky Doe Treating Physician/Extender: Frann Rider in Treatment: 1 Constitutional . Pulse regular. Respirations normal and unlabored. Afebrile. . Eyes Nonicteric. Reactive to light. Ears, Nose, Mouth, and Throat Lips, teeth, and gums WNL.Marland Kitchen Moist mucosa without lesions . Neck supple and nontender. No palpable supraclavicular or cervical adenopathy. Normal sized without goiter. Respiratory WNL. No retractions.. Cardiovascular Pedal Pulses WNL. No clubbing, cyanosis or edema. Lymphatic No adneopathy. No adenopathy. No adenopathy. Musculoskeletal Adexa without tenderness or enlargement.. Digits and nails w/o clubbing, cyanosis, infection, petechiae, ischemia, or inflammatory conditions.. Integumentary (Hair, Skin) No  suspicious lesions. No crepitus or fluctuance. No peri-wound warmth or erythema. No masses.Marland Kitchen Psychiatric Judgement and insight Intact.. No evidence of depression, anxiety, or agitation.. Notes The large perineal wound continues to be filled with slough and the superior half of it with probing down to the sacrum. The inferior part of the wound in the perineum has healthy granulation tissue. Electronic Signature(s) Signed: 02/24/2015 1:58:42 PM By: Christin Fudge MD, FACS Entered By: Christin Fudge on 02/24/2015 13:58:42 Bennett Scrape ([id number]) -------------------------------------------------------------------------------- Physician Orders Details Patient Name: Bennett Scrape Date of Service: 02/24/2015 1:00 PM Medical Record Number: [id number] Patient Account Number: [id number] Date of Birth/Sex: May 12, 1930 (81 y.o. Male) Treating RN: Cornell Barman Primary Care Physician: Dicky Doe Other Clinician: Referring Physician: Dicky Doe Treating Physician/Extender: Frann Rider in Treatment: 1 Verbal / Phone Orders: Yes Clinician: Cornell Barman Read Back and Verified: Yes Diagnosis Coding Wound Cleansing Wound #1 Midline Sacrum o Clean wound with Normal Saline. Anesthetic Wound #1 Midline Sacrum o Topical Lidocaine 4% cream applied to wound bed prior to debridement Skin Barriers/Peri-Wound Care Wound #1 Midline Sacrum o Skin Prep Primary Wound Dressing Wound #1 Midline Sacrum o Santyl Ointment - to be applied under wound vac Secondary Dressing Wound #1 Midline Sacrum o ABD pad - in clinic today, SNF to reapply wound vac. Dressing Change Frequency Wound #1 Midline Sacrum o Change Dressing Monday, Wednesday, Friday - NPWT Follow-up Appointments Wound #1 Midline Sacrum o Return Appointment in 1 week. Off-Loading Wound #1 Midline Sacrum o Turn and reposition every 2 hours - Frequent changes in position required; Do not allow patient to sit in a  chair all day. Negative Pressure Wound Therapy Allums, Damarko J. ([id number]) Wound #1 Midline Sacrum o Wound VAC settings at 125/130 mmHg continuous pressure. Use BLACK/GREEN foam to wound cavity. Use WHITE foam to fill any tunnel/s and/or undermining. Change VAC dressing 2 X WEEK. Change canister as indicated when full. Nurse may titrate settings and frequency of dressing changes as clinically indicated. o Home Health Nurse may d/c VAC for s/s of increased infection, significant wound regression, or uncontrolled drainage. [place] at [phone]. o Apply contact layer over base of wound. - Contact layer over bone o Number of foam/gauze pieces used in the dressing = Medications-please add to medication list. Wound #1 Midline Sacrum o Santyl Enzymatic Ointment - Continue Santyl Electronic Signature(s) Signed: 02/24/2015 4:29:43 PM By:  Christin Fudge MD, FACS Signed: 02/25/2015 5:26:34 PM By: Gretta Cool RN, BSN, Kim RN, BSN Entered By: Gretta Cool, RN, BSN, Kim on 02/24/2015 13:53:57 Bennett Scrape ([id number]) -------------------------------------------------------------------------------- Problem List Details Patient Name: Bennett Scrape Date of Service: 02/24/2015 1:00 PM Medical Record Number: [id number] Patient Account Number: [id number] Date of Birth/Sex: 04-22-1930 (81 y.o. Male) Treating RN: Montey Hora Primary Care Physician: Dicky Doe Other Clinician: Referring Physician: Dicky Doe Treating Physician/Extender: Frann Rider in Treatment: 1 Active Problems ICD-10 Encounter Code Description Active Date Diagnosis E11.622 Type 2 diabetes mellitus with other skin ulcer 02/17/2015 Yes T81.32XA Disruption of internal operation (surgical) wound, not 02/17/2015 Yes elsewhere classified, initial encounter L89.154 Pressure ulcer of sacral region, stage 4 02/17/2015 Yes C20 Malignant neoplasm of rectum 02/17/2015 Yes Inactive Problems Resolved  Problems Electronic Signature(s) Signed: 02/24/2015 1:56:58 PM By: Christin Fudge MD, FACS Entered By: Christin Fudge on 02/24/2015 13:56:57 Bennett Scrape ([id number]) -------------------------------------------------------------------------------- Progress Note Details Patient Name: Bennett Scrape Date of Service: 02/24/2015 1:00 PM Medical Record Number: [id number] Patient Account Number: [id number] Date of Birth/Sex: September 05, 1929 (81 y.o. Male) Treating RN: Montey Hora Primary Care Physician: Dicky Doe Other Clinician: Referring Physician: Dicky Doe Treating Physician/Extender: Frann Rider in Treatment: 1 Subjective Chief Complaint Information obtained from Patient Patient presents to the wound care center for a consult due non healing wound. the patient has a large sacral and perineal wound which she's had for about 2 months History of Present Illness (HPI) The following HPI elements were documented for the patient's wound: Location: sacral and perianal region Quality: Patient reports Tenderness to the affected area(s). Severity: Patient states wound are getting worse. Duration: Patient has had the wound for > 3 months prior to seeking treatment at the wound center Timing: Pain in wound is Intermittent (comes and goes Context: The wound occurred when the patient had a APR done in July and then the wound opened out and he had a sacral decubitus ulcer and abscess which needed debridement in the OR Modifying Factors: Other treatment(s) tried include: 3 surgeries postoperatively for the decubitus ulcer and sacral and perineal abscess Associated Signs and Symptoms: Patient reports having foul odor. 81 year old gentleman who has recently been treated for rectal cancer with a colostomy and had several postoperative complications including a sacral decubitus ulcer. No further chemotherapy was recommended. his comorbidities include hypertension, peripheral vascular  disease, diabetes mellitus, prostate cancer, rectal cancer, history of pulmonary embolus. He is status post prostatectomy,abdominal perineal bowel resection in July 2016, cystoscopy,debridement of his sacral decubitus ulcer and abscess with application for wound VAC done in July in 2016. he was recently admitted by his surgeon Dr. [name] between September 3 and 02/07/2015 for a presacral abscess, sacral decubitus ulcer stage IV, failure to thrive and application of a wound VAC. he is now a DO NOT RESUSCITATE. he is at [facility] and has a wound VAC application and is gradually improving with better nutritional support and supportive care. Objective Butikofer, Hy J. ([id number]) Constitutional Pulse regular. Respirations normal and unlabored. Afebrile. Vitals Time Taken: 1:11 AM, Height: 74 in, Weight: 225 lbs, BMI: 28.9, Temperature: 98.1 F, Pulse: 74 bpm, Respiratory Rate: 16 breaths/min, Blood Pressure: 101/66 mmHg. Eyes Nonicteric. Reactive to light. Ears, Nose, Mouth, and Throat Lips, teeth, and gums WNL.Marland Kitchen Moist mucosa without lesions . Neck supple and nontender. No palpable supraclavicular or cervical adenopathy. Normal sized without goiter. Respiratory WNL. No retractions.. Cardiovascular Pedal Pulses WNL. No clubbing, cyanosis or edema.  Lymphatic No adneopathy. No adenopathy. No adenopathy. Musculoskeletal Adexa without tenderness or enlargement.. Digits and nails w/o clubbing, cyanosis, infection, petechiae, ischemia, or inflammatory conditions.Marland Kitchen Psychiatric Judgement and insight Intact.. No evidence of depression, anxiety, or agitation.. General Notes: The large perineal wound continues to be filled with slough and the superior half of it with probing down to the sacrum. The inferior part of the wound in the perineum has healthy granulation tissue. Integumentary (Hair, Skin) No suspicious lesions. No crepitus or fluctuance. No peri-wound warmth or erythema. No  masses.. Wound #1 status is Open. Original cause of wound was Pressure Injury. The wound is located on the Midline Sacrum. The wound measures 13.5cm length x 6.5cm width x 5.5cm depth; 68.919cm^2 area and 379.053cm^3 volume. There is bone, muscle, and fat exposed. There is no tunneling or undermining noted. There is a large amount of serous drainage noted. The wound margin is flat and intact. There is small (1-33%) red granulation within the wound bed. There is a large (67-100%) amount of necrotic tissue within the wound bed including Eschar. The periwound skin appearance exhibited: Moist. The periwound skin appearance did not exhibit: Callus, Crepitus, Excoriation, Fluctuance, Friable, Induration, Localized Edema, Rash, Scarring, Dry/Scaly, Maceration, Atrophie Blanche, Cyanosis, Ecchymosis, Hemosiderin Staining, Mottled, Pallor, Rubor, Erythema. Periwound temperature was noted as No Abnormality. The periwound has tenderness on palpation. GHALI, MORISSETTE ([id number]) Assessment Active Problems ICD-10 E11.622 - Type 2 diabetes mellitus with other skin ulcer T81.32XA - Disruption of internal operation (surgical) wound, not elsewhere classified, initial encounter L89.154 - Pressure ulcer of sacral region, stage 4 C20 - Malignant neoplasm of rectum After sharply debriding as much as possible we will continue to use central under the wound VAC and also have her contact layer separating the sponge from the bone. The perianal part is fairly clean. Offloading and nutrition has again been stressed. Supportive care and comfort measures have to be encouraged Procedures Wound #1 Wound #1 is a Pressure Ulcer located on the Midline Sacrum . There was a Skin/Subcutaneous Tissue Debridement ([id number]) debridement with total area of 30 sq cm performed by Britto, Jackson Latino., MD. with the following instrument(s): Forceps and Scissors to remove Viable and Non-Viable tissue/material including Exudate, Fat,  Fibrin/Slough, Eschar, and Subcutaneous after achieving pain control using Other (lidocaine 4%). A time out was conducted prior to the start of the procedure. A Minimum amount of bleeding was controlled with Pressure. The procedure was tolerated well with a pain level of 0 throughout and a pain level of 0 following the procedure. Post Debridement Measurements: 13.5cm length x 6.5cm width x 6.5cm depth; 447.971cm^3 volume. Post debridement Stage noted as Category/Stage IV. Post procedure Diagnosis Wound #1: Same as Pre-Procedure Plan Wound Cleansing: Wound #1 Midline Sacrum: Clean wound with Normal Saline. Anesthetic: NICK, STULTS ([id number]) Wound #1 Midline Sacrum: Topical Lidocaine 4% cream applied to wound bed prior to debridement Skin Barriers/Peri-Wound Care: Wound #1 Midline Sacrum: Skin Prep Primary Wound Dressing: Wound #1 Midline Sacrum: Santyl Ointment - to be applied under wound vac Secondary Dressing: Wound #1 Midline Sacrum: ABD pad - in clinic today, SNF to reapply wound vac. Dressing Change Frequency: Wound #1 Midline Sacrum: Change Dressing Monday, Wednesday, Friday - NPWT Follow-up Appointments: Wound #1 Midline Sacrum: Return Appointment in 1 week. Off-Loading: Wound #1 Midline Sacrum: Turn and reposition every 2 hours - Frequent changes in position required; Do not allow patient to sit in a chair all day. Negative Pressure Wound Therapy: Wound #1 Midline Sacrum: Wound VAC  settings at 125/130 mmHg continuous pressure. Use BLACK/GREEN foam to wound cavity. Use WHITE foam to fill any tunnel/s and/or undermining. Change VAC dressing 2 X WEEK. Change canister as indicated when full. Nurse may titrate settings and frequency of dressing changes as clinically indicated. Home Health Nurse may d/c VAC for s/s of increased infection, significant wound regression, or uncontrolled drainage. [place] at [phone]. Apply contact layer over base of  wound. - Contact layer over bone Number of foam/gauze pieces used in the dressing = Medications-please add to medication list.: Wound #1 Midline Sacrum: Santyl Enzymatic Ointment - Continue Santyl After sharply debriding as much as possible we will continue to use central under the wound VAC and also have her contact layer separating the sponge from the bone. The perianal part is fairly clean. Offloading and nutrition has again been stressed. Supportive care and comfort measures have to be encouraged Electronic Signature(s) VISHAAL, STROLLO ([id number]) Signed: 02/24/2015 2:00:17 PM By: Christin Fudge MD, FACS Entered By: Christin Fudge on 02/24/2015 14:00:17 Bennett Scrape ([id number]) -------------------------------------------------------------------------------- SuperBill Details Patient Name: Bennett Scrape Date of Service: 02/24/2015 Medical Record Number: [id number] Patient Account Number: [id number] Date of Birth/Sex: 08-Jul-1929 (81 y.o. Male) Treating RN: Montey Hora Primary Care Physician: Dicky Doe Other Clinician: Referring Physician: Dicky Doe Treating Physician/Extender: Frann Rider in Treatment: 1 Diagnosis Coding ICD-10 Codes Code Description [phone] Type 2 diabetes mellitus with other skin ulcer Disruption of internal operation (surgical) wound, not elsewhere classified, initial T81.32XA encounter L89.154 Pressure ulcer of sacral region, stage 4 C20 Malignant neoplasm of rectum Facility Procedures CPT4: Description Modifier Quantity Code 13244010 11042 - DEB SUBQ TISSUE 20 SQ CM/< 1 ICD-10 Description Diagnosis E11.622 Type 2 diabetes mellitus with other skin ulcer T81.32XA Disruption of internal operation (surgical) wound, not elsewhere  classified, initial encounter L89.154 Pressure ulcer of sacral region, stage 4 C20 Malignant neoplasm of rectum CPT4: [id number] 11045 - DEB SUBQ TISS EA ADDL 20CM 1 ICD-10 Description Diagnosis E11.622 Type 2  diabetes mellitus with other skin ulcer T81.32XA Disruption of internal operation (surgical) wound, not elsewhere classified, initial encounter L89.154  Pressure ulcer of sacral region, stage 4 C20 Malignant neoplasm of rectum Physician Procedures CPT4: Description Modifier Quantity Code 4034742 59563 - WC PHYS SUBQ TISS 20 SQ CM 1 ICD-10 Description Diagnosis CURTEZ, BRALLIER ([id number]) Electronic Signature(s) Signed: 02/24/2015 2:01:01 PM By: Christin Fudge MD, FACS Previous Signature: 02/24/2015 2:00:40 PM Version By: Christin Fudge MD, FACS Entered By: Christin Fudge on 02/24/2015 14:01:01

## 2015-03-03 ENCOUNTER — Ambulatory Visit: Payer: Medicare Other | Admitting: Surgery

## 2015-03-04 ENCOUNTER — Encounter: Payer: Medicare Other | Attending: Surgery | Admitting: Surgery

## 2015-03-04 DIAGNOSIS — T8132XD Disruption of internal operation (surgical) wound, not elsewhere classified, subsequent encounter: Secondary | ICD-10-CM | POA: Insufficient documentation

## 2015-03-04 DIAGNOSIS — I739 Peripheral vascular disease, unspecified: Secondary | ICD-10-CM | POA: Diagnosis not present

## 2015-03-04 DIAGNOSIS — X58XXXD Exposure to other specified factors, subsequent encounter: Secondary | ICD-10-CM | POA: Insufficient documentation

## 2015-03-04 DIAGNOSIS — I10 Essential (primary) hypertension: Secondary | ICD-10-CM | POA: Diagnosis not present

## 2015-03-04 DIAGNOSIS — E11622 Type 2 diabetes mellitus with other skin ulcer: Secondary | ICD-10-CM | POA: Insufficient documentation

## 2015-03-04 DIAGNOSIS — C20 Malignant neoplasm of rectum: Secondary | ICD-10-CM | POA: Insufficient documentation

## 2015-03-04 DIAGNOSIS — L89154 Pressure ulcer of sacral region, stage 4: Secondary | ICD-10-CM | POA: Diagnosis not present

## 2015-03-05 ENCOUNTER — Ambulatory Visit: Payer: Medicare Other | Admitting: *Deleted

## 2015-03-05 ENCOUNTER — Other Ambulatory Visit: Payer: Self-pay | Admitting: *Deleted

## 2015-03-05 NOTE — Patient Outreach (Signed)
[facility]) Care Management  03/05/2015  Sonia Stickels [birth year] [id number]   This social worker arrived to visit patient at [facility] to complete initial intake assessment for [facility] services.  Patient was sitting in his wheelchair in his room when this social worker arrived.  Patient was reluctant to complete assessment stating that he had given the information packet that I had left on Monday with his daughter Manuela Schwartz and that I should call her to discuss [facility] involvement.  Phone call to patient's daughter made with patient's verbal approval.  Voicemail message left for a return call.   Sheralyn Boatman [facility] Care Management [phone]

## 2015-03-05 NOTE — Progress Notes (Signed)
Darryl Hooper, Darryl Hooper ([id number]) Visit Report for 03/04/2015 Chief Complaint Document Details Patient Name: Darryl Hooper Date of Service: 03/04/2015 3:45 PM Medical Record Number: [id number] Patient Account Number: [id number] Date of Birth/Sex: 1929/10/19 (81 y.o. Male) Treating RN: Baruch Gouty, RN, BSN, Velva Harman Primary Care Physician: Dicky Doe Other Clinician: Referring Physician: Dicky Doe Treating Physician/Extender: Frann Rider in Treatment: 2 Information Obtained from: Patient Chief Complaint Patient presents to the wound care center for a consult due non healing wound. the patient has a large sacral and perineal wound which she's had for about 2 months Electronic Signature(s) Signed: 03/04/2015 5:03:45 PM By: Christin Fudge MD, FACS Entered By: Christin Fudge on 03/04/2015 17:03:45 Darryl Hooper ([id number]) -------------------------------------------------------------------------------- Debridement Details Patient Name: Darryl Hooper Date of Service: 03/04/2015 3:45 PM Medical Record Number: [id number] Patient Account Number: [id number] Date of Birth/Sex: 08-25-1929 (81 y.o. Male) Treating RN: Baruch Gouty, RN, BSN, Velva Harman Primary Care Physician: Dicky Doe Other Clinician: Referring Physician: Dicky Doe Treating Physician/Extender: Frann Rider in Treatment: 2 Debridement Performed for Wound #1 Midline Sacrum Assessment: Performed By: Physician Christin Fudge, MD Debridement: Debridement Pre-procedure Yes Verification/Time Out Taken: Start Time: 16:40 Pain Control: Lidocaine 4% Topical Solution Level: Skin/Subcutaneous Tissue Total Area Debrided (L x 6 (cm) x 5 (cm) = 30 (cm) W): Tissue and other Viable, Non-Viable, Eschar, Fibrin/Slough, Subcutaneous material debrided: Instrument: Forceps, Scissors Bleeding: Minimum Hemostasis Achieved: Pressure End Time: 16:45 Procedural Pain: 0 Post Procedural Pain: 0 Response to Treatment: Procedure  was tolerated well Post Debridement Measurements of Total Wound Length: (cm) 17 Stage: Category/Stage IV Width: (cm) 7 Depth: (cm) 6 Volume: (cm) 560.774 Post Procedure Diagnosis Same as Pre-procedure Electronic Signature(s) Signed: 03/04/2015 5:03:39 PM By: Christin Fudge MD, FACS Signed: 03/04/2015 5:04:50 PM By: Regan Lemming BSN, RN Previous Signature: 03/04/2015 4:57:46 PM Version By: Regan Lemming BSN, RN Entered By: Christin Fudge on 03/04/2015 17:03:39 Darryl Hooper ([id number]) -------------------------------------------------------------------------------- HPI Details Patient Name: Darryl Hooper Date of Service: 03/04/2015 3:45 PM Medical Record Number: [id number] Patient Account Number: [id number] Date of Birth/Sex: Sep 02, 1929 (81 y.o. Male) Treating RN: Baruch Gouty, RN, BSN, [place] Primary Care Physician: Dicky Doe Other Clinician: Referring Physician: Dicky Doe Treating Physician/Extender: Frann Rider in Treatment: 2 History of Present Illness Location: sacral and perianal region Quality: Patient reports Tenderness to the affected area(s). Severity: Patient states wound are getting worse. Duration: Patient has had the wound for > 3 months prior to seeking treatment at the wound center Timing: Pain in wound is Intermittent (comes and goes Context: The wound occurred when the patient had a APR done in July and then the wound opened out and he had a sacral decubitus ulcer and abscess which needed debridement in the OR Modifying Factors: Other treatment(s) tried include: 3 surgeries postoperatively for the decubitus ulcer and sacral and perineal abscess Associated Signs and Symptoms: Patient reports having foul odor. HPI Description: 81 year old gentleman who has recently been treated for rectal cancer with a colostomy and had several postoperative complications including a sacral decubitus ulcer. No further chemotherapy was recommended. his comorbidities  include hypertension, peripheral vascular disease, diabetes mellitus, prostate cancer, rectal cancer, history of pulmonary embolus. He is status post prostatectomy,abdominal perineal bowel resection in July 2016, cystoscopy,debridement of his sacral decubitus ulcer and abscess with application for wound VAC done in July in 2016. he was recently admitted by his surgeon Dr. [name] between September 3 and 02/07/2015 for a presacral abscess, sacral decubitus ulcer stage IV,  failure to thrive and application of a wound VAC. he is now a DO NOT RESUSCITATE. he is at [facility] and has a wound VAC application and is gradually improving with better nutritional support and supportive care. Electronic Signature(s) Signed: 03/04/2015 5:03:51 PM By: Christin Fudge MD, FACS Entered By: Christin Fudge on 03/04/2015 17:03:51 Darryl Hooper ([id number]) -------------------------------------------------------------------------------- Physical Exam Details Patient Name: Darryl Hooper Date of Service: 03/04/2015 3:45 PM Medical Record Number: [id number] Patient Account Number: [id number] Date of Birth/Sex: Oct 19, 1929 (81 y.o. Male) Treating RN: Baruch Gouty, RN, BSN, Velva Harman Primary Care Physician: Dicky Doe Other Clinician: Referring Physician: Dicky Doe Treating Physician/Extender: Frann Rider in Treatment: 2 Constitutional . Pulse regular. Respirations normal and unlabored. Afebrile. . Eyes Nonicteric. Reactive to light. Ears, Nose, Mouth, and Throat Lips, teeth, and gums WNL.Marland Kitchen Moist mucosa without lesions . Neck supple and nontender. No palpable supraclavicular or cervical adenopathy. Normal sized without goiter. Respiratory WNL. No retractions.. Cardiovascular Pedal Pulses WNL. No clubbing, cyanosis or edema. Lymphatic No adneopathy. No adenopathy. No adenopathy. Musculoskeletal Adexa without tenderness or enlargement.. Digits and nails w/o clubbing, cyanosis, infection,  petechiae, ischemia, or inflammatory conditions.. Integumentary (Hair, Skin) No suspicious lesions. No crepitus or fluctuance. No peri-wound warmth or erythema. No masses.Marland Kitchen Psychiatric Judgement and insight Intact.. No evidence of depression, anxiety, or agitation.. Notes the wound on the sacrum has a lot of slough and this was sharply debrided down as much as we could. The perineal part is fairly healthy and the patient has a wound VAC application.. Electronic Signature(s) Signed: 03/04/2015 5:04:25 PM By: Christin Fudge MD, FACS Entered By: Christin Fudge on 03/04/2015 17:04:25 Darryl Hooper ([id number]) -------------------------------------------------------------------------------- Physician Orders Details Patient Name: Darryl Hooper Date of Service: 03/04/2015 3:45 PM Medical Record Number: [id number] Patient Account Number: [id number] Date of Birth/Sex: 1930/05/10 (81 y.o. Male) Treating RN: Baruch Gouty, RN, BSN, Velva Harman Primary Care Physician: Dicky Doe Other Clinician: Referring Physician: Dicky Doe Treating Physician/Extender: Frann Rider in Treatment: 2 Verbal / Phone Orders: Yes Clinician: Afful, RN, BSN, Rita Read Back and Verified: Yes Diagnosis Coding Wound Cleansing Wound #1 Midline Sacrum o Clean wound with Normal Saline. Anesthetic Wound #1 Midline Sacrum o Topical Lidocaine 4% cream applied to wound bed prior to debridement Skin Barriers/Peri-Wound Care Wound #1 Midline Sacrum o Skin Prep Primary Wound Dressing Wound #1 Midline Sacrum o Santyl Ointment - to be applied under wound vac Secondary Dressing Wound #1 Midline Sacrum o ABD pad - in clinic today, SNF to reapply wound vac. Dressing Change Frequency Wound #1 Midline Sacrum o Change Dressing Monday, Wednesday, Friday - NPWT Follow-up Appointments Wound #1 Midline Sacrum o Return Appointment in 1 week. Off-Loading Wound #1 Midline Sacrum o Turn and reposition every 2  hours - Frequent changes in position required; Do not allow patient to sit in a chair all day. Negative Pressure Wound Therapy Wisler, Asad J. ([id number]) Wound #1 Midline Sacrum o Wound VAC settings at 125/130 mmHg continuous pressure. Use BLACK/GREEN foam to wound cavity. Use WHITE foam to fill any tunnel/s and/or undermining. Change VAC dressing 2 X WEEK. Change canister as indicated when full. Nurse may titrate settings and frequency of dressing changes as clinically indicated. o Home Health Nurse may d/c VAC for s/s of increased infection, significant wound regression, or uncontrolled drainage. [place] at [phone]. o Apply contact layer over base of wound. - Contact layer over bone o Number of foam/gauze pieces used in the dressing = Medications-please  add to medication list. Wound #1 Midline Sacrum o Santyl Enzymatic Ointment - Continue Santyl Electronic Signature(s) Signed: 03/04/2015 4:43:04 PM By: Regan Lemming BSN, RN Signed: 03/04/2015 5:06:09 PM By: Christin Fudge MD, FACS Entered By: Regan Lemming on 03/04/2015 16:43:04 Darryl Hooper ([id number]) -------------------------------------------------------------------------------- Problem List Details Patient Name: Darryl Hooper Date of Service: 03/04/2015 3:45 PM Medical Record Number: [id number] Patient Account Number: [id number] Date of Birth/Sex: 09-02-1929 (81 y.o. Male) Treating RN: Baruch Gouty, RN, BSN, Velva Harman Primary Care Physician: Dicky Doe Other Clinician: Referring Physician: Dicky Doe Treating Physician/Extender: Frann Rider in Treatment: 2 Active Problems ICD-10 Encounter Code Description Active Date Diagnosis E11.622 Type 2 diabetes mellitus with other skin ulcer 02/17/2015 Yes T81.32XA Disruption of internal operation (surgical) wound, not 02/17/2015 Yes elsewhere classified, initial encounter L89.154 Pressure ulcer of sacral region, stage 4 02/17/2015 Yes C20  Malignant neoplasm of rectum 02/17/2015 Yes Inactive Problems Resolved Problems Electronic Signature(s) Signed: 03/04/2015 5:03:16 PM By: Christin Fudge MD, FACS Entered By: Christin Fudge on 03/04/2015 17:03:16 Darryl Hooper ([id number]) -------------------------------------------------------------------------------- Progress Note Details Patient Name: Darryl Hooper Date of Service: 03/04/2015 3:45 PM Medical Record Number: [id number] Patient Account Number: [id number] Date of Birth/Sex: 1930-04-26 (81 y.o. Male) Treating RN: Baruch Gouty, RN, BSN, Velva Harman Primary Care Physician: Dicky Doe Other Clinician: Referring Physician: Dicky Doe Treating Physician/Extender: Frann Rider in Treatment: 2 Subjective Chief Complaint Information obtained from Patient Patient presents to the wound care center for a consult due non healing wound. the patient has a large sacral and perineal wound which she's had for about 2 months History of Present Illness (HPI) The following HPI elements were documented for the patient's wound: Location: sacral and perianal region Quality: Patient reports Tenderness to the affected area(s). Severity: Patient states wound are getting worse. Duration: Patient has had the wound for > 3 months prior to seeking treatment at the wound center Timing: Pain in wound is Intermittent (comes and goes Context: The wound occurred when the patient had a APR done in July and then the wound opened out and he had a sacral decubitus ulcer and abscess which needed debridement in the OR Modifying Factors: Other treatment(s) tried include: 3 surgeries postoperatively for the decubitus ulcer and sacral and perineal abscess Associated Signs and Symptoms: Patient reports having foul odor. 81 year old gentleman who has recently been treated for rectal cancer with a colostomy and had several postoperative complications including a sacral decubitus ulcer. No further chemotherapy  was recommended. his comorbidities include hypertension, peripheral vascular disease, diabetes mellitus, prostate cancer, rectal cancer, history of pulmonary embolus. He is status post prostatectomy,abdominal perineal bowel resection in July 2016, cystoscopy,debridement of his sacral decubitus ulcer and abscess with application for wound VAC done in July in 2016. he was recently admitted by his surgeon Dr. [name] between September 3 and 02/07/2015 for a presacral abscess, sacral decubitus ulcer stage IV, failure to thrive and application of a wound VAC. he is now a DO NOT RESUSCITATE. he is at [facility] and has a wound VAC application and is gradually improving with better nutritional support and supportive care. Objective Nichelson, Jaheim J. ([id number]) Constitutional Pulse regular. Respirations normal and unlabored. Afebrile. Vitals Time Taken: 4:04 PM, Height: 74 in, Weight: 225 lbs, BMI: 28.9, Temperature: 97.9 F, Pulse: 87 bpm, Respiratory Rate: 17 breaths/min, Blood Pressure: 111/60 mmHg. Eyes Nonicteric. Reactive to light. Ears, Nose, Mouth, and Throat Lips, teeth, and gums WNL.Marland Kitchen Moist mucosa without lesions . Neck supple and nontender. No  palpable supraclavicular or cervical adenopathy. Normal sized without goiter. Respiratory WNL. No retractions.. Cardiovascular Pedal Pulses WNL. No clubbing, cyanosis or edema. Lymphatic No adneopathy. No adenopathy. No adenopathy. Musculoskeletal Adexa without tenderness or enlargement.. Digits and nails w/o clubbing, cyanosis, infection, petechiae, ischemia, or inflammatory conditions.Marland Kitchen Psychiatric Judgement and insight Intact.. No evidence of depression, anxiety, or agitation.. General Notes: the wound on the sacrum has a lot of slough and this was sharply debrided down as much as we could. The perineal part is fairly healthy and the patient has a wound VAC application.. Integumentary (Hair, Skin) No suspicious lesions. No  crepitus or fluctuance. No peri-wound warmth or erythema. No masses.. Wound #1 status is Open. Original cause of wound was Pressure Injury. The wound is located on the Midline Sacrum. The wound measures 17cm length x 7cm width x 6cm depth; 93.462cm^2 area and 560.774cm^3 volume. There is bone, muscle, and fat exposed. There is no tunneling or undermining noted. There is a large amount of serous drainage noted. The wound margin is flat and intact. There is small (1-33%) red granulation within the wound bed. There is a large (67-100%) amount of necrotic tissue within the wound bed including Eschar. The periwound skin appearance exhibited: Moist. The periwound skin appearance did not exhibit: Callus, Crepitus, Excoriation, Fluctuance, Friable, Induration, Localized Edema, Rash, Scarring, Dry/Scaly, Maceration, Atrophie Blanche, Cyanosis, Ecchymosis, Hemosiderin Staining, Mottled, Pallor, Rubor, Erythema. Periwound temperature was noted as No Abnormality. The periwound has tenderness on palpation. THOMSON, HERBERS ([id number]) Assessment Active Problems ICD-10 E11.622 - Type 2 diabetes mellitus with other skin ulcer T81.32XA - Disruption of internal operation (surgical) wound, not elsewhere classified, initial encounter L89.154 - Pressure ulcer of sacral region, stage 4 C20 - Malignant neoplasm of rectum Procedures Wound #1 Wound #1 is a Pressure Ulcer located on the Midline Sacrum . There was a Skin/Subcutaneous Tissue Debridement ([id number]) debridement with total area of 30 sq cm performed by Christin Fudge, MD. with the following instrument(s): Forceps and Scissors to remove Viable and Non-Viable tissue/material including Fibrin/Slough, Eschar, and Subcutaneous after achieving pain control using Lidocaine 4% Topical Solution. A time out was conducted prior to the start of the procedure. A Minimum amount of bleeding was controlled with Pressure. The procedure was tolerated well with a pain  level of 0 throughout and a pain level of 0 following the procedure. Post Debridement Measurements: 17cm length x 7cm width x 6cm depth; 560.774cm^3 volume. Post debridement Stage noted as Category/Stage IV. Post procedure Diagnosis Wound #1: Same as Pre-Procedure Plan Wound Cleansing: Wound #1 Midline Sacrum: Clean wound with Normal Saline. Anesthetic: Wound #1 Midline Sacrum: Topical Lidocaine 4% cream applied to wound bed prior to debridement Skin Barriers/Peri-Wound Care: Wound #1 Midline Sacrum: Skin Prep Primary Wound Dressing: Wound #1 Midline Sacrum: Santyl Ointment - to be applied under wound vac Herrig, Kay J. ([id number]) Secondary Dressing: Wound #1 Midline Sacrum: ABD pad - in clinic today, SNF to reapply wound vac. Dressing Change Frequency: Wound #1 Midline Sacrum: Change Dressing Monday, Wednesday, Friday - NPWT Follow-up Appointments: Wound #1 Midline Sacrum: Return Appointment in 1 week. Off-Loading: Wound #1 Midline Sacrum: Turn and reposition every 2 hours - Frequent changes in position required; Do not allow patient to sit in a chair all day. Negative Pressure Wound Therapy: Wound #1 Midline Sacrum: Wound VAC settings at 125/130 mmHg continuous pressure. Use BLACK/GREEN foam to wound cavity. Use WHITE foam to fill any tunnel/s and/or undermining. Change VAC dressing 2 X WEEK. Change canister as indicated  when full. Nurse may titrate settings and frequency of dressing changes as clinically indicated. Home Health Nurse may d/c VAC for s/s of increased infection, significant wound regression, or uncontrolled drainage. [place] at [phone]. Apply contact layer over base of wound. - Contact layer over bone Number of foam/gauze pieces used in the dressing = Medications-please add to medication list.: Wound #1 Midline Sacrum: Santyl Enzymatic Ointment - Continue Santyl After sharply debriding as much as possible we will continue to use  Santyl ointment under the wound VAC and also have her contact layer separating the sponge from the bone. The perianal part is fairly clean. Offloading and nutrition has again been stressed. Supportive care and comfort measures have to be encouraged Electronic Signature(s) Signed: 03/04/2015 5:04:52 PM By: Christin Fudge MD, FACS Entered By: Christin Fudge on 03/04/2015 17:04:52 Darryl Hooper ([id number]) -------------------------------------------------------------------------------- SuperBill Details Patient Name: Darryl Hooper Date of Service: 03/04/2015 Medical Record Number: [id number] Patient Account Number: [id number] Date of Birth/Sex: 1929/09/24 (81 y.o. Male) Treating RN: Baruch Gouty, RN, BSN, Velva Harman Primary Care Physician: Dicky Doe Other Clinician: Referring Physician: Dicky Doe Treating Physician/Extender: Frann Rider in Treatment: 2 Diagnosis Coding ICD-10 Codes Code Description [phone] Type 2 diabetes mellitus with other skin ulcer Disruption of internal operation (surgical) wound, not elsewhere classified, initial T81.32XA encounter L89.154 Pressure ulcer of sacral region, stage 4 C20 Malignant neoplasm of rectum Facility Procedures CPT4: Description Modifier Quantity Code [id number] 11042 - DEB SUBQ TISSUE 20 SQ CM/< 1 ICD-10 Description Diagnosis E11.622 Type 2 diabetes mellitus with other skin ulcer T81.32XA Disruption of internal operation (surgical) wound, not elsewhere  classified, initial encounter L89.154 Pressure ulcer of sacral region, stage 4 CPT4: [id number] - DEB SUBQ TISS EA ADDL 20CM 1 ICD-10 Description Diagnosis E11.622 Type 2 diabetes mellitus with other skin ulcer T81.32XA Disruption of internal operation (surgical) wound, not elsewhere classified, initial encounter L89.154  Pressure ulcer of sacral region, stage 4 Physician Procedures CPT4: Description Modifier Quantity Code [id number] 73532 - WC PHYS SUBQ TISS 20 SQ CM 1 ICD-10  Description Diagnosis E11.622 Type 2 diabetes mellitus with other skin ulcer T81.JOSHAU, CODE ([id number]) Electronic Signature(s) Signed: 03/04/2015 5:05:40 PM By: Christin Fudge MD, FACS Previous Signature: 03/04/2015 5:05:19 PM Version By: Christin Fudge MD, FACS Entered By: Christin Fudge on 03/04/2015 17:05:40
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
## 2015-03-05 NOTE — Patient Outreach (Signed)
[facility]) Care Management  03/05/2015  Phenix Vandermeulen. 1929-07-23 [id number]   Phone call to discharge planner at [facility] who reports that no discharge date has been set for patient to date. Care planning meeting has not been scheduled as they just had one.  Palliative care was ordered on 02/17/15 and started on 02/18/15.  Contact Jacob Moores [phone].  Per Jonni Sanger, patient's wife and daughter are wanting patient to return home.  Patient's son pushing for Palliative Care.  Per Jonni Sanger, conversation has been started  regarding the possibility that patient may not be able to return home, long term medicaid also discussed, however she was told that they would like to discuss this as a family.  Plan:  This social worker will continue make efforts to follow up with the family regarding long term care plans.            This social worker will follow up with discharge planner by 03/07/15.   Sheralyn Boatman [facility] Care Management [phone]

## 2015-03-06 DIAGNOSIS — E119 Type 2 diabetes mellitus without complications: Secondary | ICD-10-CM

## 2015-03-06 DIAGNOSIS — I120 Hypertensive chronic kidney disease with stage 5 chronic kidney disease or end stage renal disease: Secondary | ICD-10-CM

## 2015-03-06 DIAGNOSIS — Z515 Encounter for palliative care: Secondary | ICD-10-CM | POA: Diagnosis not present

## 2015-03-06 DIAGNOSIS — L89154 Pressure ulcer of sacral region, stage 4: Secondary | ICD-10-CM | POA: Diagnosis not present

## 2015-03-06 DIAGNOSIS — C20 Malignant neoplasm of rectum: Secondary | ICD-10-CM | POA: Diagnosis not present

## 2015-03-06 DIAGNOSIS — R627 Adult failure to thrive: Secondary | ICD-10-CM

## 2015-03-06 DIAGNOSIS — Z66 Do not resuscitate: Secondary | ICD-10-CM | POA: Diagnosis not present

## 2015-03-07 DIAGNOSIS — E0840 Diabetes mellitus due to underlying condition with diabetic neuropathy, unspecified: Secondary | ICD-10-CM | POA: Diagnosis not present

## 2015-03-07 DIAGNOSIS — E119 Type 2 diabetes mellitus without complications: Secondary | ICD-10-CM | POA: Diagnosis not present

## 2015-03-07 DIAGNOSIS — Z933 Colostomy status: Secondary | ICD-10-CM | POA: Diagnosis not present

## 2015-03-07 DIAGNOSIS — I10 Essential (primary) hypertension: Secondary | ICD-10-CM | POA: Diagnosis not present

## 2015-03-07 DIAGNOSIS — N401 Enlarged prostate with lower urinary tract symptoms: Secondary | ICD-10-CM | POA: Diagnosis not present

## 2015-03-10 ENCOUNTER — Other Ambulatory Visit: Payer: Self-pay | Admitting: *Deleted

## 2015-03-10 NOTE — Patient Outreach (Signed)
[place] [facility] [facility]) Care Management  03/10/2015  Darryl Hooper. Oct 10, 1929 [id number]   Phone call to Palliative care nurse-Darryl Hooper who stated that she has spoken with patient's daughter who has progressed a little more in regard to her understanding of  patient's need for 24 hour care.  Per Darryl Hooper, patient's wife does not want patient to discharge home due to his level of care needs. Family meeting with Palliative care nurse scheduled for Wednesday, 03/12/15.  Hospice recommendation to be discussed.   Phone call from discharge planner, Darryl Hooper who spoke with daughter has well.  Long term Medicaid application completed and submitted.on 03/07/15.  Agrees with long term care as best case scenario.  Patient approved for 55 more days of rehabilitation care.   Plan:  Follow up on 03/13/15 following meeting with palliative care nurse.    Darryl Hooper [facility] Care Management [id number]

## 2015-03-13 ENCOUNTER — Encounter: Payer: Medicare Other | Admitting: Surgery

## 2015-03-13 DIAGNOSIS — I10 Essential (primary) hypertension: Secondary | ICD-10-CM | POA: Diagnosis not present

## 2015-03-13 DIAGNOSIS — I739 Peripheral vascular disease, unspecified: Secondary | ICD-10-CM | POA: Diagnosis not present

## 2015-03-13 DIAGNOSIS — E11622 Type 2 diabetes mellitus with other skin ulcer: Secondary | ICD-10-CM | POA: Diagnosis not present

## 2015-03-13 DIAGNOSIS — C20 Malignant neoplasm of rectum: Secondary | ICD-10-CM | POA: Diagnosis not present

## 2015-03-13 DIAGNOSIS — L89154 Pressure ulcer of sacral region, stage 4: Secondary | ICD-10-CM | POA: Diagnosis not present

## 2015-03-13 DIAGNOSIS — T8132XD Disruption of internal operation (surgical) wound, not elsewhere classified, subsequent encounter: Secondary | ICD-10-CM | POA: Diagnosis not present

## 2015-03-13 DIAGNOSIS — T8132XA Disruption of internal operation (surgical) wound, not elsewhere classified, initial encounter: Secondary | ICD-10-CM | POA: Diagnosis not present
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
## 2015-03-13 NOTE — Progress Notes (Signed)
KHIEM, GARGIS ([id number]) Visit Report for 03/13/2015 Chief Complaint Document Details Patient Name: Darryl Hooper, Darryl Hooper Date of Service: 03/13/2015 3:15 PM Medical Record Number: [id number] Patient Account Number: [id number] Date of Birth/Sex: 06-13-1929 (81 y.o. Male) Treating RN: Baruch Gouty, RN, BSN, Velva Harman Primary Care Physician: Dicky Doe Other Clinician: Referring Physician: Dicky Doe Treating Physician/Extender: Frann Rider in Treatment: 3 Information Obtained from: Patient Chief Complaint Patient presents to the wound care center for a consult due non healing wound. the patient has a large sacral and perineal wound which she's had for about 2 months Electronic Signature(s) Signed: 03/13/2015 3:33:51 PM By: Christin Fudge MD, FACS Entered By: Christin Fudge on 03/13/2015 15:33:50 Darryl Hooper ([id number]) -------------------------------------------------------------------------------- HPI Details Patient Name: Darryl Hooper Date of Service: 03/13/2015 3:15 PM Medical Record Number: [id number] Patient Account Number: [id number] Date of Birth/Sex: Sep 22, 1929 (81 y.o. Male) Treating RN: Baruch Gouty, RN, BSN, Velva Harman Primary Care Physician: Dicky Doe Other Clinician: Referring Physician: Dicky Doe Treating Physician/Extender: Frann Rider in Treatment: 3 History of Present Illness Location: sacral and perianal region Quality: Patient reports Tenderness to the affected area(s). Severity: Patient states wound are getting worse. Duration: Patient has had the wound for > 3 months prior to seeking treatment at the wound center Timing: Pain in wound is Intermittent (comes and goes Context: The wound occurred when the patient had a APR done in July and then the wound opened out and he had a sacral decubitus ulcer and abscess which needed debridement in the OR Modifying Factors: Other treatment(s) tried include: 3 surgeries postoperatively for the decubitus  ulcer and sacral and perineal abscess Associated Signs and Symptoms: Patient reports having foul odor. HPI Description: 81 year old gentleman who has recently been treated for rectal cancer with a colostomy and had several postoperative complications including a sacral decubitus ulcer. No further chemotherapy was recommended. his comorbidities include hypertension, peripheral vascular disease, diabetes mellitus, prostate cancer, rectal cancer, history of pulmonary embolus. He is status post prostatectomy,abdominal perineal bowel resection in July 2016, cystoscopy,debridement of his sacral decubitus ulcer and abscess with application for wound VAC done in July in 2016. he was recently admitted by his surgeon Dr. [name] between September 3 and 02/07/2015 for a presacral abscess, sacral decubitus ulcer stage IV, failure to thrive and application of a wound VAC. he is now a DO NOT RESUSCITATE. he is at [facility] and has a wound VAC application and is gradually improving with better nutritional support and supportive care. Electronic Signature(s) Signed: 03/13/2015 3:33:55 PM By: Christin Fudge MD, FACS Entered By: Christin Fudge on 03/13/2015 15:33:55 Darryl Hooper ([id number]) -------------------------------------------------------------------------------- Physical Exam Details Patient Name: Darryl Hooper Date of Service: 03/13/2015 3:15 PM Medical Record Number: [id number] Patient Account Number: [id number] Date of Birth/Sex: 07-28-1929 (81 y.o. Male) Treating RN: Baruch Gouty, RN, BSN, Velva Harman Primary Care Physician: Dicky Doe Other Clinician: Referring Physician: Dicky Doe Treating Physician/Extender: Frann Rider in Treatment: 3 Constitutional . Pulse regular. Respirations normal and unlabored. Afebrile. . Eyes Nonicteric. Reactive to light. Ears, Nose, Mouth, and Throat Lips, teeth, and gums WNL.Marland Kitchen Moist mucosa without lesions . Neck supple and nontender. No  palpable supraclavicular or cervical adenopathy. Normal sized without goiter. Respiratory WNL. No retractions.. Breath sounds WNL, No rubs, rales, rhonchi, or wheeze.. Cardiovascular Heart rhythm and rate regular, no murmur or gallop.. Pedal Pulses WNL. No clubbing, cyanosis or edema. Chest Breasts symmetical and no nipple discharge.. Breast tissue WNL, no masses, lumps, or tenderness.Marland Kitchen  Lymphatic No adneopathy. No adenopathy. No adenopathy. Musculoskeletal Adexa without tenderness or enlargement.. Digits and nails w/o clubbing, cyanosis, infection, petechiae, ischemia, or inflammatory conditions.. Integumentary (Hair, Skin) No suspicious lesions. No crepitus or fluctuance. No peri-wound warmth or erythema. No masses.Marland Kitchen Psychiatric Judgement and insight Intact.. No evidence of depression, anxiety, or agitation.. Notes the wound continues to have a lot of slough and towards the inferior part of the perineum there is some bleeding from the edges which was cauterized with silver nitrate. Electronic Signature(s) Signed: 03/13/2015 3:34:29 PM By: Christin Fudge MD, FACS Entered By: Christin Fudge on 03/13/2015 15:34:29 Darryl Hooper ([id number]) -------------------------------------------------------------------------------- Physician Orders Details Patient Name: Darryl Hooper Date of Service: 03/13/2015 3:15 PM Medical Record Number: [id number] Patient Account Number: [id number] Date of Birth/Sex: 1929/12/12 (81 y.o. Male) Treating RN: Baruch Gouty, RN, BSN, Velva Harman Primary Care Physician: Dicky Doe Other Clinician: Referring Physician: Dicky Doe Treating Physician/Extender: Frann Rider in Treatment: 3 Verbal / Phone Orders: Yes Clinician: Afful, RN, BSN, Rita Read Back and Verified: Yes Diagnosis Coding Wound Cleansing Wound #1 Midline Sacrum o Clean wound with Normal Saline. Anesthetic Wound #1 Midline Sacrum o Topical Lidocaine 4% cream applied to wound bed  prior to debridement Skin Barriers/Peri-Wound Care Wound #1 Midline Sacrum o Skin Prep Primary Wound Dressing Wound #1 Midline Sacrum o Santyl Ointment - to be applied under wound vac Secondary Dressing Wound #1 Midline Sacrum o ABD pad - in clinic today, SNF to reapply wound vac. Dressing Change Frequency Wound #1 Midline Sacrum o Change Dressing Monday, Wednesday, Friday - NPWT Follow-up Appointments Wound #1 Midline Sacrum o Return Appointment in 2 weeks. Off-Loading Wound #1 Midline Sacrum o Turn and reposition every 2 hours - Frequent changes in position required; Do not allow patient to sit in a chair all day. Negative Pressure Wound Therapy Beaudry, Rockie J. ([id number]) Wound #1 Midline Sacrum o Wound VAC settings at 125/130 mmHg continuous pressure. Use BLACK/GREEN foam to wound cavity. Use WHITE foam to fill any tunnel/s and/or undermining. Change VAC dressing 3 X WEEK. Change canister as indicated when full. Nurse may titrate settings and frequency of dressing changes as clinically indicated. o Home Health Nurse may d/c VAC for s/s of increased infection, significant wound regression, or uncontrolled drainage. [place] at [phone]. o Apply contact layer over base of wound. - Contact layer over bone o Number of foam/gauze pieces used in the dressing = Medications-please add to medication list. Wound #1 Midline Sacrum o Santyl Enzymatic Ointment - Continue Santyl Electronic Signature(s) Signed: 03/13/2015 3:34:44 PM By: Regan Lemming BSN, RN Signed: 03/13/2015 3:40:39 PM By: Christin Fudge MD, FACS Entered By: Regan Lemming on 03/13/2015 15:34:44 Darryl Hooper ([id number]) -------------------------------------------------------------------------------- Problem List Details Patient Name: Darryl Hooper Date of Service: 03/13/2015 3:15 PM Medical Record Number: [id number] Patient Account Number: [id number] Date of Birth/Sex:  [birth year] (81 y.o. Male) Treating RN: Baruch Gouty, RN, BSN, Velva Harman Primary Care Physician: Dicky Doe Other Clinician: Referring Physician: Dicky Doe Treating Physician/Extender: Frann Rider in Treatment: 3 Active Problems ICD-10 Encounter Code Description Active Date Diagnosis E11.622 Type 2 diabetes mellitus with other skin ulcer 02/17/2015 Yes T81.32XA Disruption of internal operation (surgical) wound, not 02/17/2015 Yes elsewhere classified, initial encounter L89.154 Pressure ulcer of sacral region, stage 4 02/17/2015 Yes C20 Malignant neoplasm of rectum 02/17/2015 Yes Inactive Problems Resolved Problems Electronic Signature(s) Signed: 03/13/2015 3:33:27 PM By: Christin Fudge MD, FACS Entered By: Christin Fudge on 03/13/2015 15:33:27 Darryl Hooper, Darryl J. (  [id number]) -------------------------------------------------------------------------------- Progress Note Details Patient Name: Darryl Hooper, Darryl Hooper Date of Service: 03/13/2015 3:15 PM Medical Record Number: [id number] Patient Account Number: [id number] Date of Birth/Sex: [birth year] (81 y.o. Male) Treating RN: Baruch Gouty, RN, BSN, Velva Harman Primary Care Physician: Dicky Doe Other Clinician: Referring Physician: Dicky Doe Treating Physician/Extender: Frann Rider in Treatment: 3 Subjective Chief Complaint Information obtained from Patient Patient presents to the wound care center for a consult due non healing wound. the patient has a large sacral and perineal wound which she's had for about 2 months History of Present Illness (HPI) The following HPI elements were documented for the patient's wound: Location: sacral and perianal region Quality: Patient reports Tenderness to the affected area(s). Severity: Patient states wound are getting worse. Duration: Patient has had the wound for > 3 months prior to seeking treatment at the wound center Timing: Pain in wound is Intermittent (comes and goes Context: The  wound occurred when the patient had a APR done in July and then the wound opened out and he had a sacral decubitus ulcer and abscess which needed debridement in the OR Modifying Factors: Other treatment(s) tried include: 3 surgeries postoperatively for the decubitus ulcer and sacral and perineal abscess Associated Signs and Symptoms: Patient reports having foul odor. 81 year old gentleman who has recently been treated for rectal cancer with a colostomy and had several postoperative complications including a sacral decubitus ulcer. No further chemotherapy was recommended. his comorbidities include hypertension, peripheral vascular disease, diabetes mellitus, prostate cancer, rectal cancer, history of pulmonary embolus. He is status post prostatectomy,abdominal perineal bowel resection in July 2016, cystoscopy,debridement of his sacral decubitus ulcer and abscess with application for wound VAC done in July in 2016. he was recently admitted by his surgeon Dr. [name] between September 3 and 02/07/2015 for a presacral abscess, sacral decubitus ulcer stage IV, failure to thrive and application of a wound VAC. he is now a DO NOT RESUSCITATE. he is at [facility] and has a wound VAC application and is gradually improving with better nutritional support and supportive care. Objective Darryl Hooper, Darryl J. ([id number]) Constitutional Pulse regular. Respirations normal and unlabored. Afebrile. Vitals Time Taken: 3:10 PM, Height: 74 in, Weight: 225 lbs, BMI: 28.9, Temperature: 97.8 F, Pulse: 81 bpm, Respiratory Rate: 17 breaths/min, Blood Pressure: 117/70 mmHg. Eyes Nonicteric. Reactive to light. Ears, Nose, Mouth, and Throat Lips, teeth, and gums WNL.Marland Kitchen Moist mucosa without lesions . Neck supple and nontender. No palpable supraclavicular or cervical adenopathy. Normal sized without goiter. Respiratory WNL. No retractions.. Breath sounds WNL, No rubs, rales, rhonchi, or  wheeze.. Cardiovascular Heart rhythm and rate regular, no murmur or gallop.. Pedal Pulses WNL. No clubbing, cyanosis or edema. Chest Breasts symmetical and no nipple discharge.. Breast tissue WNL, no masses, lumps, or tenderness.. Lymphatic No adneopathy. No adenopathy. No adenopathy. Musculoskeletal Adexa without tenderness or enlargement.. Digits and nails w/o clubbing, cyanosis, infection, petechiae, ischemia, or inflammatory conditions.Marland Kitchen Psychiatric Judgement and insight Intact.. No evidence of depression, anxiety, or agitation.. General Notes: the wound continues to have a lot of slough and towards the inferior part of the perineum there is some bleeding from the edges which was cauterized with silver nitrate. Integumentary (Hair, Skin) No suspicious lesions. No crepitus or fluctuance. No peri-wound warmth or erythema. No masses.. Wound #1 status is Open. Original cause of wound was Pressure Injury. The wound is located on the Midline Sacrum. The wound measures 17cm length x 9cm width x 6cm depth; 120.166cm^2 area and 720.996cm^3 volume. There is  bone, muscle, and fat exposed. There is no tunneling or undermining noted. There is a large amount of serosanguineous drainage noted. The wound margin is flat and intact. There is small (1-33%) red granulation within the wound bed. There is a large (67-100%) amount of necrotic tissue within the wound bed including Eschar. The periwound skin appearance exhibited: Moist. The periwound skin appearance did not exhibit: Callus, Crepitus, Excoriation, Fluctuance, Friable, Induration, Localized Edema, Rash, Scarring, Dry/Scaly, Maceration, Atrophie Blanche, Cyanosis, Ecchymosis, Hemosiderin Darryl Hooper, Darryl J. ([id number]) Staining, Mottled, Pallor, Rubor, Erythema. Periwound temperature was noted as No Abnormality. The periwound has tenderness on palpation. Assessment Active Problems ICD-10 E11.622 - Type 2 diabetes mellitus with other skin  ulcer T81.32XA - Disruption of internal operation (surgical) wound, not elsewhere classified, initial encounter L89.154 - Pressure ulcer of sacral region, stage 4 C20 - Malignant neoplasm of rectum We will continue using Santyl and a nonadherent dressing on the bone and in the areas where there is healthy granulation tissue we can use some silver alginate and the wound VAC. The patient's general condition is very poor he is on a morphine supportive group and they're considering hospice. I have discussed with the daughter who is the caregiver and offered supportive care at the wound center as long as they require it. All questions answered. Plan Wound Cleansing: Wound #1 Midline Sacrum: Clean wound with Normal Saline. Anesthetic: Wound #1 Midline Sacrum: Topical Lidocaine 4% cream applied to wound bed prior to debridement Skin Barriers/Peri-Wound Care: Wound #1 Midline Sacrum: Skin Prep Primary Wound Dressing: Wound #1 Midline Sacrum: Santyl Ointment - to be applied under wound vac Secondary Dressing: Wound #1 Midline Sacrum: ABD pad - in clinic today, SNF to reapply wound vac. Dressing Change Frequency: Wound #1 Midline SacrumCAMREN, Darryl Hooper ([id number]) Change Dressing Monday, Wednesday, Friday - NPWT Follow-up Appointments: Wound #1 Midline Sacrum: Return Appointment in 2 weeks. Off-Loading: Wound #1 Midline Sacrum: Turn and reposition every 2 hours - Frequent changes in position required; Do not allow patient to sit in a chair all day. Negative Pressure Wound Therapy: Wound #1 Midline Sacrum: Wound VAC settings at 125/130 mmHg continuous pressure. Use BLACK/GREEN foam to wound cavity. Use WHITE foam to fill any tunnel/s and/or undermining. Change VAC dressing 3 X WEEK. Change canister as indicated when full. Nurse may titrate settings and frequency of dressing changes as clinically indicated. Home Health Nurse may d/c VAC for s/s of increased infection, significant wound  regression, or uncontrolled drainage. [place] at [phone]. Apply contact layer over base of wound. - Contact layer over bone Number of foam/gauze pieces used in the dressing = Medications-please add to medication list.: Wound #1 Midline Sacrum: Santyl Enzymatic Ointment - Continue Santyl We will continue using Santyl and a nonadherent dressing on the bone and in the areas where there is healthy granulation tissue we can use some silver alginate and the wound VAC. The patient's general condition is very poor he is on a morphine supportive group and they're considering hospice. I have discussed with the daughter who is the caregiver and offered supportive care at the wound center as long as they require it. All questions answered. Electronic Signature(s) Signed: 03/13/2015 3:35:40 PM By: Christin Fudge MD, FACS Entered By: Christin Fudge on 03/13/2015 15:35:40 Darryl Hooper ([id number]) -------------------------------------------------------------------------------- SuperBill Details Patient Name: Darryl Hooper Date of Service: 03/13/2015 Medical Record Number: [id number] Patient Account Number: [id number] Date of Birth/Sex: 1929/08/20 (81 y.o. Male) Treating RN: Baruch Gouty, RN, BSN, [employer]  Care Physician: Dicky Doe Other Clinician: Referring Physician: Dicky Doe Treating Physician/Extender: Frann Rider in Treatment: 3 Diagnosis Coding ICD-10 Codes Code Description E11.622 Type 2 diabetes mellitus with other skin ulcer Disruption of internal operation (surgical) wound, not elsewhere classified, initial T81.32XA encounter L89.154 Pressure ulcer of sacral region, stage 4 C20 Malignant neoplasm of rectum Facility Procedures CPT4 Code: [id number] Description: 99213 - WOUND CARE VISIT-LEV 3 EST PT Modifier: Quantity: 1 Physician Procedures CPT4: Description Modifier Quantity Code [id number] - WC PHYS LEVEL 3 - EST PT 1 ICD-10  Description Diagnosis E11.622 Type 2 diabetes mellitus with other skin ulcer T81.32XA Disruption of internal operation (surgical) wound, not elsewhere classified,  initial encounter L89.154 Pressure ulcer of sacral region, stage 4 C20 Malignant neoplasm of rectum Electronic Signature(s) Signed: 03/13/2015 3:49:50 PM By: Regan Lemming BSN, RN Signed: 03/13/2015 4:06:38 PM By: Christin Fudge MD, FACS Previous Signature: 03/13/2015 3:35:55 PM Version By: Christin Fudge MD, FACS Entered By: Regan Lemming on 03/13/2015 15:49:50

## 2015-03-17 ENCOUNTER — Other Ambulatory Visit: Payer: Self-pay | Admitting: *Deleted

## 2015-03-17 NOTE — Patient Outreach (Signed)
[facility]) Care Management  03/17/2015  Nyeem Stoke. Oct 31, 1929 [id number]   Phone call to Vickki Hearing, social worker at [facility] to follow up on results of family meting with Hospice and Palliative Care.  Voicemail message left for a return call.   Sheralyn Boatman [facility] Care Management [phone]

## 2015-03-19 ENCOUNTER — Other Ambulatory Visit: Payer: Self-pay | Admitting: *Deleted

## 2015-03-19 ENCOUNTER — Encounter: Payer: Self-pay | Admitting: *Deleted

## 2015-03-19 NOTE — Patient Outreach (Signed)
[facility]) Care Management  03/19/2015  Elzie Knisley. 12/22/1929 [id number]   Phone call from Vickki Hearing, social worker at [facility] who confirmed that patient will be discharging home under Hospice care on 03/22/15.   Case to be closed to [facility] at this time.  RNCM to be notified, case closure letter to be sent to patient's doctor.    Sheralyn Boatman [facility] Care Management [phone]

## 2015-03-20 ENCOUNTER — Encounter: Payer: Self-pay | Admitting: *Deleted

## 2015-03-20 NOTE — Patient Outreach (Signed)
[facility]) Care Management  03/20/2015  Weldon Nouri 25-Jan-1930 [id number]   As noted by Social Worker , [employer], Mr.Cristiano will be discharged from the SNF to home with hospice services. Case will be closed.  Joylene Draft, RN, [facility] Management [phone]- Mobile [phone]- Toll Free Main Office

## 2015-03-21 ENCOUNTER — Telehealth: Payer: Self-pay

## 2015-03-21 ENCOUNTER — Ambulatory Visit: Payer: Medicare Other | Admitting: General Surgery

## 2015-03-21 NOTE — Telephone Encounter (Signed)
Moved to hospice.

## 2015-03-21 NOTE — Telephone Encounter (Signed)
Noted-jh 

## 2015-03-28 ENCOUNTER — Telehealth: Payer: Self-pay

## 2015-03-28 NOTE — Telephone Encounter (Signed)
Received notice from Hospice that patient died on Mar 30, 2015 at 3:00 am. [facility]

## 2015-04-01 DEATH — deceased

## 2015-04-18 ENCOUNTER — Ambulatory Visit: Payer: Medicare Other | Admitting: Oncology

## 2015-10-20 IMAGING — CT NM PET TUM IMG INITIAL (PI) SKULL BASE T - THIGH
1 of 9 series · 1 of 25 positions shown · non-contrast
Comparison: CT scan of 02/19/2010.

CLINICAL DATA: Initial treatment strategy for rectal cancer.

EXAM:
NUCLEAR MEDICINE PET SKULL BASE TO THIGH
TECHNIQUE: 12.5 mCi F-18 FDG was injected intravenously. Full-ring PET imaging
was performed from the skull base to thigh after the radiotracer. CT
data was obtained and used for attenuation correction and anatomic
localization.
FASTING BLOOD GLUCOSE:  Value: 131 mg/dl

[Series 3: ct wb 5.0 b30f · axial · 5.0mm · 0.98mm/px · 1 of 329 slices shown]
[im 329/329  brain]
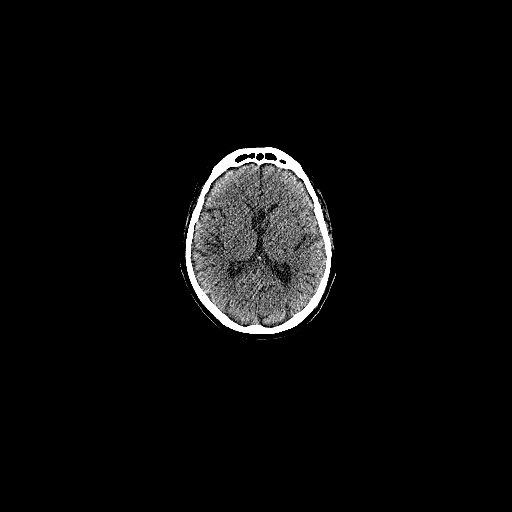

[1 of 25 positions shown; findings below may reference images not displayed]

FINDINGS: NECK

2.5 cm soft tissue lesion in the region of the left oral pharynx is
markedly hypermetabolic with SUV max = 10.8. CT images suggests that
this is deep to the mucosa rather than a true mucosal lesion, but
streak artifact from dental fillings hinders assessment. A small
hypermetabolic focus is identified just posterior to the right
mandible (see image 34 series 3). No definite underlying nodule or
mass is evident.

CHEST

No hypermetabolic mediastinal or hilar nodes. No suspicious
pulmonary nodules on the CT scan.

Heart is enlarged.  Coronary artery calcification is evident.

ABDOMEN/PELVIS

No abnormal hypermetabolic activity within the liver, pancreas,
adrenal glands, or spleen. No hypermetabolic lymph nodes in the
abdomen or pelvis.

Atherosclerotic calcification noted in the abdominal aorta. Areas of
cortical scarring are seen in the kidneys, left greater than right.
Brachytherapy seeds are present in the prostate region.

Focal marked hypermetabolism is seen in the right aspect of the
distal rectum, and region of apparent irregular wall thickening on
CT imaging (see image 268 series 3). No evidence for hypermetabolic
pelvic sidewall lymphadenopathy.

SKELETON

No focal hypermetabolic activity to suggest skeletal metastasis.
IMPRESSION: 1. Focal hypermetabolism in an area of right-sided rectal wall
thickening, compatible with the reported history of rectal neoplasm.
2. Unexpected markedly hypermetabolic 2.5 cm soft tissue lesion in
the left oropharyngeal region, causing mass-effect on the
oropharynx. Area is difficult to assess given streak artifact from
dental fillings, but this lesion may be related to lymphadenopathy
rather than an oropharyngeal mucosal lesion. FDG uptake is in the
malignant range. This would be an unusual location for metastatic
rectal cancer given the absence of other definite metastatic lesions
on this study.
3. Brachytherapy seeds in the prostate bed. No evidence for
hypermetabolic bone lesions.
These results will be called to the ordering clinician or
representative by the Radiologist Assistant, and communication
documented in the PACS or zVision Dashboard.

## 2015-11-23 IMAGING — CR DG C-ARM 1-60 MIN-NO REPORT
2 series · 2 of 2 positions shown · non-contrast
Comparison: none

[b1]
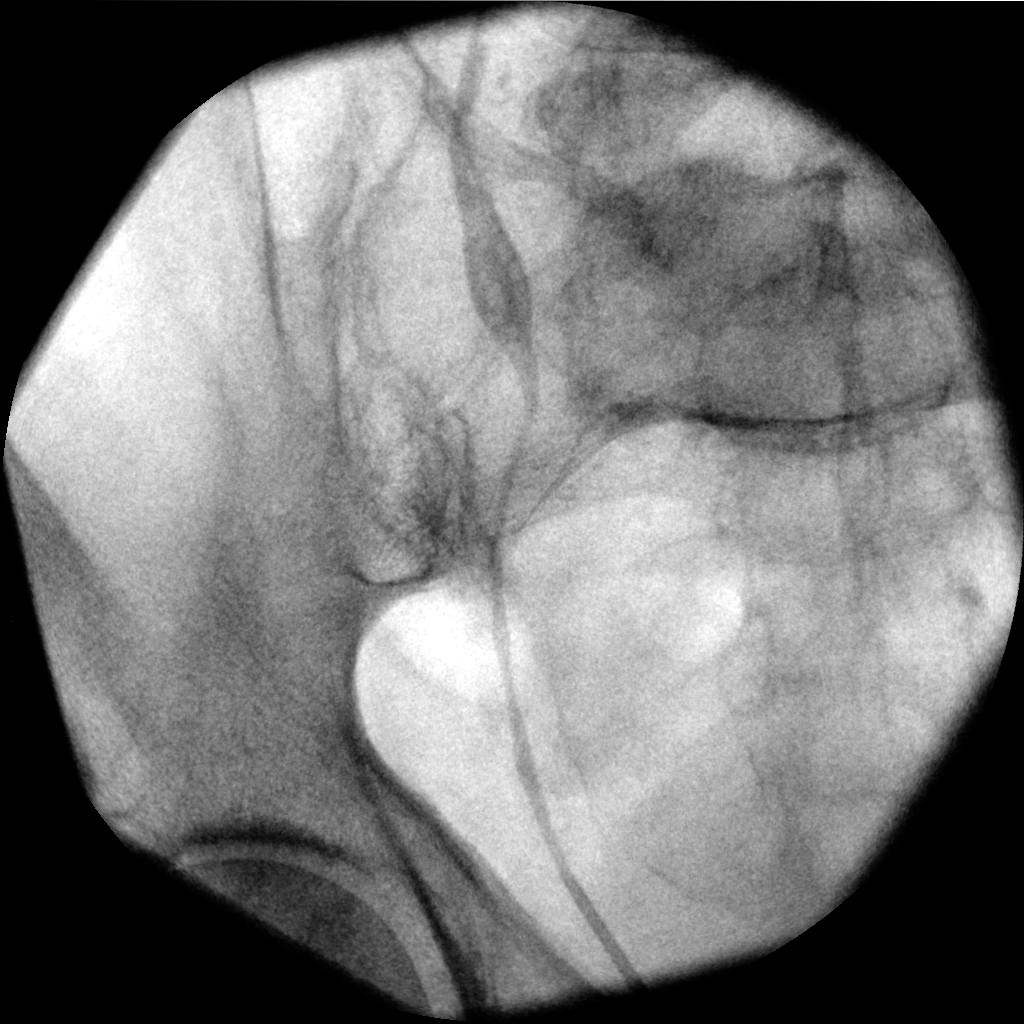

[b2]
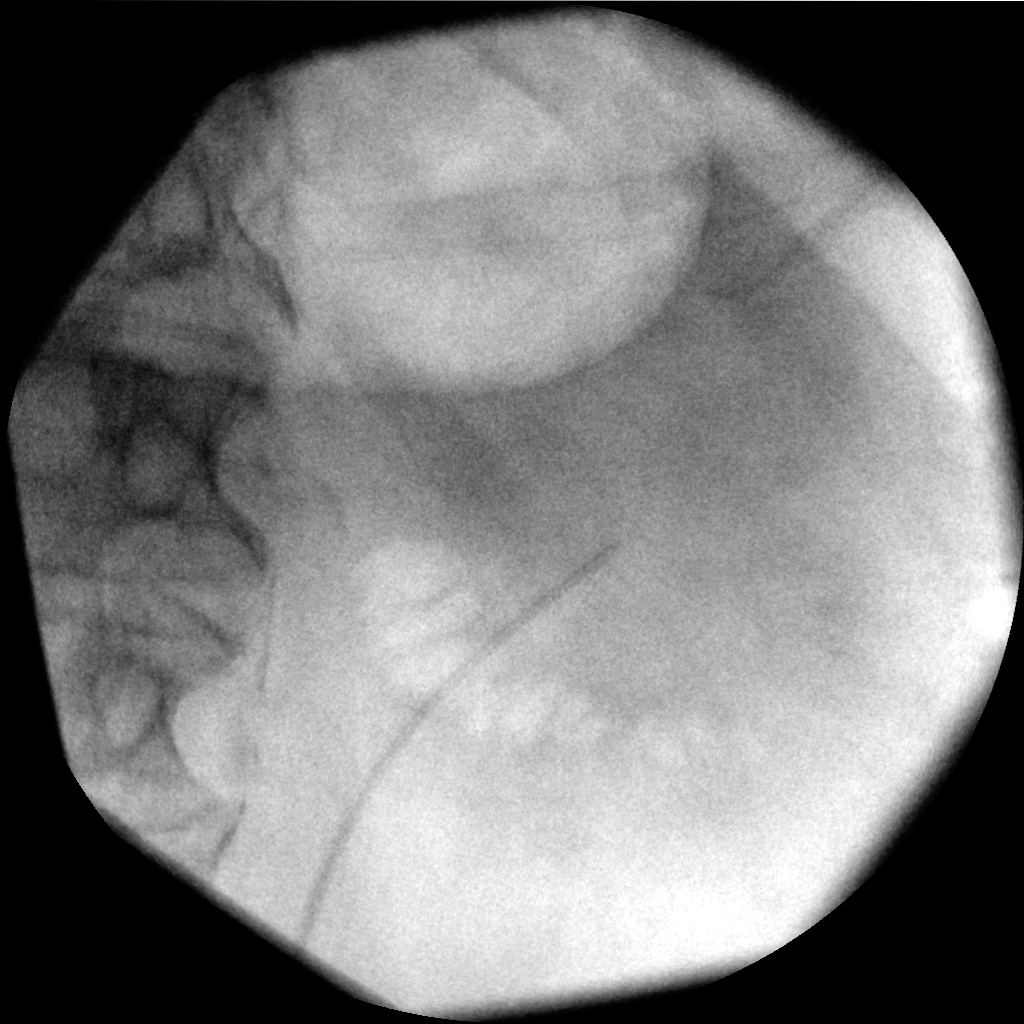

[2 of 2 positions shown; findings below may reference images not displayed]

:
Fluoroscopy was utilized by the requesting physician. No
radiographic interpretation.
